# Patient Record
Sex: Female | Born: 1940
Health system: Southern US, Community
[De-identification: ages and names within clinical notes are randomized; demographics above are authoritative.]

## PROBLEM LIST (undated history)

## (undated) DIAGNOSIS — N939 Abnormal uterine and vaginal bleeding, unspecified: Secondary | ICD-10-CM

## (undated) DIAGNOSIS — N8500 Endometrial hyperplasia, unspecified: Secondary | ICD-10-CM

## (undated) DIAGNOSIS — F419 Anxiety disorder, unspecified: Secondary | ICD-10-CM

## (undated) DIAGNOSIS — N766 Ulceration of vulva: Secondary | ICD-10-CM

## (undated) DIAGNOSIS — N950 Postmenopausal bleeding: Secondary | ICD-10-CM

## (undated) DIAGNOSIS — C801 Malignant (primary) neoplasm, unspecified: Secondary | ICD-10-CM

## (undated) DIAGNOSIS — N854 Malposition of uterus: Secondary | ICD-10-CM

## (undated) DIAGNOSIS — I480 Paroxysmal atrial fibrillation: Secondary | ICD-10-CM

## (undated) HISTORY — DX: Ulceration of vulva: N76.6

## (undated) HISTORY — DX: Postmenopausal bleeding: N95.0

## (undated) HISTORY — DX: Endometrial hyperplasia, unspecified: N85.00

## (undated) HISTORY — DX: Malposition of uterus: N85.4

---

## 1999-07-02 ENCOUNTER — Other Ambulatory Visit: Admission: RE | Admit: 1999-07-02 | Discharge: 1999-07-02 | Payer: Self-pay | Admitting: *Deleted

## 1999-07-17 ENCOUNTER — Ambulatory Visit (HOSPITAL_COMMUNITY): Admission: RE | Admit: 1999-07-17 | Discharge: 1999-07-17 | Payer: Self-pay | Admitting: *Deleted

## 2000-04-30 ENCOUNTER — Emergency Department (HOSPITAL_COMMUNITY): Admission: EM | Admit: 2000-04-30 | Discharge: 2000-05-01 | Payer: Self-pay | Admitting: *Deleted

## 2000-07-07 ENCOUNTER — Other Ambulatory Visit: Admission: RE | Admit: 2000-07-07 | Discharge: 2000-07-07 | Payer: Self-pay | Admitting: *Deleted

## 2007-08-05 ENCOUNTER — Ambulatory Visit: Payer: Self-pay | Admitting: Gastroenterology

## 2007-08-08 ENCOUNTER — Encounter: Admission: RE | Admit: 2007-08-08 | Discharge: 2007-08-08 | Payer: Self-pay | Admitting: Internal Medicine

## 2007-09-06 ENCOUNTER — Ambulatory Visit: Payer: Self-pay | Admitting: Gastroenterology

## 2007-09-06 ENCOUNTER — Encounter: Payer: Self-pay | Admitting: Gastroenterology

## 2011-04-14 NOTE — Assessment & Plan Note (Signed)
[facility]                         GASTROENTEROLOGY OFFICE NOTE   NAME:Jill Cooke, Jill Cooke                          MRN:          [id number]  DATE:08/05/2007                            DOB:          Nov 09, 1941    REFERRING PHYSICIAN:  Allena Napoleon   REASON FOR CONSULTATION:  Hematochezia.   HISTORY OF PRESENT ILLNESS:  Jill Cooke is a very nice, 70 year old,  white female referred through the courtesy of Dr. Mia Creek. The  patient relates occasional episodes of small amounts of bright red blood  intermittently over the past few years. Over the past 2 weeks, she has  had 2 episodes of bright red and dark red rectal bleeding, the volume is  small, and it has not been associated with rectal pain or abdominal pain  and the symptoms resolved spontaneously. She notes no change in bowel  habits, change in stool caliber, constipation, diarrhea, abdominal pain,  rectal pain or weight loss. There is no family history of colon cancer,  colon polyps, or inflammatory bowel disease.   PAST MEDICAL HISTORY:  Measles in 1958.   PAST SURGICAL HISTORY:  Status post tonsillectomy at age 69.   CURRENT MEDICATIONS:  Aspirin 325 mg daily p.r.n. headache.   MEDICATION ALLERGIES:  None known.   SOCIAL HISTORY:  She is married with 2 children. She is a Statistician. She denies any tobacco product use. She drinks a modest amount  of alcohol.   REVIEW OF SYSTEMS:  Remarkable for sleeping problems over the past year  or so. Her last menstrual period was in 1990.   PHYSICAL EXAMINATION:  GENERAL:  Overweight, white female in no acute  distress.  VITAL SIGNS:  Height 5 feet 4 inches, weight 216 pounds, blood pressure  is 146/80, pulse 76 and regular.  HEENT:  Anicteric sclera, oropharynx clear.  CHEST:  Clear to auscultation bilaterally.  CARDIAC:  Regular rate and rhythm without murmurs appreciated.  ABDOMEN:  Soft, nontender, nondistended, normal active  bowel sounds, no  palpable organomegaly, masses, or hernias.  RECTAL:  Deferred to time of colonoscopy.  EXTREMITIES:  Without clubbing, cyanosis or edema.  NEUROLOGIC:  Alert and oriented x3. Grossly nonfocal.   ASSESSMENT/PLAN:  Small volume, painless hematochezia. Rule out  hemorrhoids, colorectal neoplasms and other disorders. The risks,  benefits, and alternatives to colonoscopy with polyps  with biopsy, possible polypectomy and possible destruction of internal  hemorrhoids discussed with the patient and she consents to proceed. This  will be scheduled electively.     Venita Lick. Russella Dar, MD, [facility]  Electronically Signed    MTS/MedQ  DD: 08/05/2007  DT: 08/05/2007  Job #: [id number]   cc:   Allena Napoleon

## 2012-07-05 ENCOUNTER — Encounter: Payer: Self-pay | Admitting: Gynecologic Oncology

## 2012-07-07 ENCOUNTER — Encounter: Payer: Self-pay | Admitting: Gynecologic Oncology

## 2012-07-07 ENCOUNTER — Ambulatory Visit: Payer: Medicare Other | Attending: Gynecologic Oncology | Admitting: Gynecologic Oncology

## 2012-07-07 VITALS — BP 154/90 | HR 72 | Temp 98.5°F | Resp 16 | Ht 62.91 in | Wt 217.5 lb

## 2012-07-07 DIAGNOSIS — N950 Postmenopausal bleeding: Secondary | ICD-10-CM | POA: Insufficient documentation

## 2012-07-07 DIAGNOSIS — N8500 Endometrial hyperplasia, unspecified: Secondary | ICD-10-CM

## 2012-07-07 DIAGNOSIS — N882 Stricture and stenosis of cervix uteri: Secondary | ICD-10-CM | POA: Insufficient documentation

## 2012-07-07 NOTE — Patient Instructions (Addendum)
Recommendation was made for a re attempt at endometrial sampling by uterine curettage 07/19/2012 by Dr. De Blanch.  Administer cytotec in the vaginal the evening prior.    Thank you very much [name] for allowing me to provide care for you today.  I appreciate your confidence in choosing our Gynecologic Oncology team.  If you have any questions about your visit today please call our office and we will get back to you as soon as possible.  Maryclare Labrador. Justyne Roell MD., PhD Gynecologic Oncology

## 2012-07-07 NOTE — Progress Notes (Signed)
Consult Note: Gyn-Onc  Consult was requested by Dr. Elmore Guise for the evaluation of Jill Cooke 71 y.o. female  CC: Postmenopausal bleeding.  HPI:  71 y/o G2P2 LNMP at 71 years old.    Patient noted clear vaginal discharge in JN that progressed to bleeding the following month.  Seen by Dr. Isabella Stalling in 01/2012 and atrophic vaginitis suspected.  Vaginal bleeding continued and she was referred to Dr. Elmore Guise,  Treated with (?) Megace and antibiotics with out improvement.  Pelvic UTZ 04/2012 c/w 2cm endometrial stripe and retroverted uterus. EMB unsuccesful.  She ws taken to the OR by Dr. Elmore Guise on  06/24/2012.  D&C hysteroscopy was not feasible because of severe cervical stenosis noted.  Patient reports continued spotting.    Current Meds:  Outpatient Encounter Prescriptions as of 07/07/2012  Medication Sig Dispense Refill  . Calcium Carbonate-Vitamin D (CALCIUM + D PO) Take by mouth.      . fish oil-omega-3 fatty acids 1000 MG capsule Take by mouth daily.      Marland Kitchen VITAMIN D, CHOLECALCIFEROL, PO Take by mouth.      . clobetasol cream (TEMOVATE) 0.05 % Apply topically. Apply to affected area on vulva daily until symptoms resolve and then 3 times per week.  If no symptoms, may stop using        Allergy: No Known Allergies  Social Hx:   History   Social History  . Marital Status: Married    Spouse Name: N/A    Number of Children: N/A  . Years of Education: N/A   Occupational History  . Not on file.   Social History Main Topics  . Smoking status: Never Smoker   . Smokeless tobacco: Not on file  . Alcohol Use: Yes     ocassional wine or beer  . Drug Use: No  . Sexually Active: No   Other Topics Concern  . Not on file   Social History Narrative  . No narrative on file    Past Surgical Hx: No past surgical history on file.  Past Medical Hx:  Past Medical History  Diagnosis Date  . Endometrial hyperplasia   . Postmenopausal bleeding   . Retroversion of uterus   . Vulvar ulceration      Past Gynecological History: G2P2 Menarche 9 years. Menopause 47 years.  No h/o abnormal pap. No LMP recorded. Patient is postmenopausal.  Family Hx: No family history on file.  Review of Systems:  Constitutional  Feels well,Cardiovascular  No chest pain, shortness of breath, Pulmonary  No cough or wheeze.  Gastro Intestinal  No nausea, vomitting, or diarrhoea. No bright red blood per rectum, no abdominal pain, change in bowel movement, or constipation.  Genito Urinary  No frequency, urgency, dysuria, vaginal spotting and intermittent clear vaginal discharge. Musculo Skeletal  No myalgia, arthralgia, joint swelling or pain  Neurologic  No weakness, numbness, change in gait,  Psychology  No depression, anxiety, insomnia.   Vitals:  Blood pressure 154/90, pulse 72, temperature 98.5 F (36.9 C), temperature source Oral, resp. rate 16, height 5' 2.91" (1.598 m), weight 217 lb 8 oz (98.657 kg).  Physical Exam: WD in NAD Neck  Supple NROM, without any enlargements.  Lymph Node Survey No cervical supraclavicular or inguinal adenopathy Cardiovascular  Pulse normal rate, regularity and rhythm. S1 and S2 normal.  Lungs  Clear to auscultation bilateraly, without wheezes/crackles/rhonchi. Good air movement.  Skin  No rash/lesions/breakdown  Psychiatry  Alert and oriented to person, place, and time  Abdomen  Normoactive bowel sounds, abdomen soft, non-tender and obese. Surgical  sites intact without evidence of hernia.  Back No CVA tenderness Genito Urinary  Vulva/vagina: Normal external female genitalia.  No lesions. No discharge or bleeding.  Bladder/urethra:  No lesions or masses  Vagina:atrophic, no blood or discharge is visible.  Cervix: Normal appearing, no lesions.Unable to enter the internal os.  Uterus: Small, mobile, no parametrial involvement or nodularity.  Adnexa: No palpable masses. Rectal  Good tone, no masses no cul de sac nodularity.  Extremities  No  bilateral cyanosis, clubbing or edema.   Assessment/Plan:  Jill Cooke  is a 71 y.o.  year old with postmenopausal bleeding and cervical stenosis.  On UTZ the uterus is retroverted with a 2.0cm endometrial stripe.  Benign endocervical material obtained at previous attempts to assess the uterus.   An unsuccessful  attempt was made to sample the uterine cavity at this visit.  Differential diagnosis is neoplasm versus an endometrial polyp.  Recommendation was made for a re attempt at endometrial sampling.  Plan for uterine curettage 07/19/2012 by Dr. De Blanch.  Patient advised to administer cytotec in the vaginal the evening prior.  Risks and benefits of the procedure were discussed with the patient.   Laurette Schimke, MD, PhD 07/07/2012, 12:25 PM

## 2012-07-11 ENCOUNTER — Encounter (HOSPITAL_COMMUNITY): Payer: Self-pay | Admitting: Pharmacy Technician

## 2012-07-15 ENCOUNTER — Encounter (HOSPITAL_COMMUNITY)
Admission: RE | Admit: 2012-07-15 | Discharge: 2012-07-15 | Disposition: A | Discharge status: Home / Self Care | Payer: Medicare Other | Source: Ambulatory Visit | Attending: Gynecology | Admitting: Gynecology

## 2012-07-15 ENCOUNTER — Encounter (HOSPITAL_COMMUNITY): Payer: Self-pay

## 2012-07-15 HISTORY — DX: Anxiety disorder, unspecified: F41.9

## 2012-07-15 LAB — CBC WITH DIFFERENTIAL/PLATELET
Eosinophils Relative: 1 % (ref 0–5)
Lymphocytes Relative: 23 % (ref 12–46)
Lymphs Abs: 2.4 10*3/uL (ref 0.7–4.0)
MCV: 90.4 fL (ref 78.0–100.0)
Monocytes Absolute: 0.7 10*3/uL (ref 0.1–1.0)
Monocytes Relative: 7 % (ref 3–12)
Platelets: 352 10*3/uL (ref 150–400)

## 2012-07-15 LAB — BASIC METABOLIC PANEL
BUN: 15 mg/dL (ref 6–23)
Calcium: 8.9 mg/dL (ref 8.4–10.5)
Creatinine, Ser: 0.84 mg/dL (ref 0.50–1.10)
GFR calc Af Amer: 79 mL/min — ABNORMAL LOW (ref >90)
GFR calc non Af Amer: 68 mL/min — ABNORMAL LOW (ref >90)
Glucose, Bld: 93 mg/dL (ref 70–99)

## 2012-07-15 LAB — SURGICAL PCR SCREEN: MRSA, PCR: NEGATIVE

## 2012-07-15 NOTE — Pre-Procedure Instructions (Signed)
EKG 6/13 chart

## 2012-07-15 NOTE — Patient Instructions (Signed)
20 Jill Cooke  07/15/2012   Your procedure is scheduled on:  07/19/12 Tuesday  Surgery 1305-1405  Report to [facility] at  1035     AM.  Call this number if you have problems the morning of surgery: [phone]     Or PST   [id number]  [name]   Remember:   Do not eat food or drink any fluids :After Midnight.  Monday NIGHT   Take these medicines the morning of surgery with A SIP OF WATER:   none   Do not wear jewelry, make-up or nail polish.  Do not wear lotions, powders, or perfumes. You may wear deodorant.  Do not shave 48 hours prior to surgery.  Do not bring valuables to the hospital.  Contacts, dentures or bridgework may not be worn into surgery.  Leave suitcase in the car. After surgery it may be brought to your room.  For patients admitted to the hospital, checkout time is 11:00 AM the day of discharge.   Patients discharged the day of surgery will not be allowed to drive home.  Name and phone number of your driver:     husband                                                                 Special Instructions: CHG Shower Use Special Wash: 1/2 bottle night before surgery and 1/2 bottle morning of surgery. REGULAR SOAP FACE AND PRIVATES              LADIES- NO SHAVING 48 HOURS BEFORE USING BETASEPT SOAP.                 Please read over the following fact sheets that you were given: MRSA Information

## 2012-07-19 ENCOUNTER — Other Ambulatory Visit: Payer: Self-pay

## 2012-07-19 ENCOUNTER — Ambulatory Visit (HOSPITAL_COMMUNITY)
Admission: RE | Admit: 2012-07-19 | Discharge: 2012-07-19 | Disposition: A | Discharge status: Other Acute Inpatient Hospital | Payer: Medicare Other | Source: Ambulatory Visit | Attending: Gynecology | Admitting: Gynecology

## 2012-07-19 ENCOUNTER — Inpatient Hospital Stay (HOSPITAL_COMMUNITY)
Admission: EM | Admit: 2012-07-19 | Discharge: 2012-07-20 | DRG: 310 | Disposition: A | Discharge status: Home / Self Care | Payer: Medicare Other | Attending: Cardiovascular Disease | Admitting: Cardiovascular Disease

## 2012-07-19 ENCOUNTER — Encounter (HOSPITAL_COMMUNITY): Payer: Self-pay | Admitting: *Deleted

## 2012-07-19 ENCOUNTER — Encounter (HOSPITAL_COMMUNITY): Payer: Self-pay | Admitting: Anesthesiology

## 2012-07-19 ENCOUNTER — Encounter (HOSPITAL_COMMUNITY)
Admission: RE | Disposition: A | Discharge status: Other Acute Inpatient Hospital | Payer: Self-pay | Source: Ambulatory Visit | Attending: Gynecology

## 2012-07-19 DIAGNOSIS — I4891 Unspecified atrial fibrillation: Secondary | ICD-10-CM | POA: Insufficient documentation

## 2012-07-19 DIAGNOSIS — I480 Paroxysmal atrial fibrillation: Secondary | ICD-10-CM

## 2012-07-19 DIAGNOSIS — N949 Unspecified condition associated with female genital organs and menstrual cycle: Secondary | ICD-10-CM | POA: Insufficient documentation

## 2012-07-19 DIAGNOSIS — Z01812 Encounter for preprocedural laboratory examination: Secondary | ICD-10-CM

## 2012-07-19 DIAGNOSIS — N938 Other specified abnormal uterine and vaginal bleeding: Secondary | ICD-10-CM | POA: Insufficient documentation

## 2012-07-19 DIAGNOSIS — N950 Postmenopausal bleeding: Secondary | ICD-10-CM

## 2012-07-19 HISTORY — DX: Abnormal uterine and vaginal bleeding, unspecified: N93.9

## 2012-07-19 HISTORY — DX: Paroxysmal atrial fibrillation: I48.0

## 2012-07-19 LAB — COMPREHENSIVE METABOLIC PANEL
ALT: 11 U/L (ref 0–35)
Alkaline Phosphatase: 104 U/L (ref 39–117)
BUN: 15 mg/dL (ref 6–23)
BUN: 13 mg/dL (ref 6–23)
CO2: 29 mEq/L (ref 19–32)
Calcium: 8.9 mg/dL (ref 8.4–10.5)
Chloride: 103 mEq/L (ref 96–112)
Creatinine, Ser: 0.78 mg/dL (ref 0.50–1.10)
GFR calc Af Amer: >90 mL/min (ref >90)
GFR calc Af Amer: >90 mL/min (ref >90)
GFR calc non Af Amer: 81 mL/min — ABNORMAL LOW (ref >90)
GFR calc non Af Amer: 82 mL/min — ABNORMAL LOW (ref >90)
Glucose, Bld: 108 mg/dL — ABNORMAL HIGH (ref 70–99)
Glucose, Bld: 106 mg/dL — ABNORMAL HIGH (ref 70–99)
Potassium: 4.1 mEq/L (ref 3.5–5.1)
Total Bilirubin: 0.3 mg/dL (ref 0.3–1.2)
Total Protein: 7.6 g/dL (ref 6.0–8.3)
Total Protein: 7.3 g/dL (ref 6.0–8.3)

## 2012-07-19 LAB — CBC WITH DIFFERENTIAL/PLATELET
Eosinophils Absolute: 0.1 10*3/uL (ref 0.0–0.7)
Eosinophils Absolute: 0.1 10*3/uL (ref 0.0–0.7)
Eosinophils Relative: 1 % (ref 0–5)
HCT: 42.3 % (ref 36.0–46.0)
Hemoglobin: 14.8 g/dL (ref 12.0–15.0)
Hemoglobin: 14.5 g/dL (ref 12.0–15.0)
Lymphocytes Relative: 21 % (ref 12–46)
Lymphs Abs: 2.3 10*3/uL (ref 0.7–4.0)
Lymphs Abs: 2.4 10*3/uL (ref 0.7–4.0)
MCH: 31 pg (ref 26.0–34.0)
MCH: 30.7 pg (ref 26.0–34.0)
MCV: 89.4 fL (ref 78.0–100.0)
Monocytes Absolute: 0.6 10*3/uL (ref 0.1–1.0)
Monocytes Relative: 6 % (ref 3–12)
Monocytes Relative: 6 % (ref 3–12)
Neutrophils Relative %: 71 % (ref 43–77)
Platelets: 331 10*3/uL (ref 150–400)
RBC: 4.78 MIL/uL (ref 3.87–5.11)
RBC: 4.73 MIL/uL (ref 3.87–5.11)
WBC: 11.3 10*3/uL — ABNORMAL HIGH (ref 4.0–10.5)

## 2012-07-19 LAB — PROTIME-INR
INR: 1.07 (ref 0.00–1.49)
Prothrombin Time: 14.1 seconds (ref 11.6–15.2)

## 2012-07-19 LAB — MAGNESIUM: Magnesium: 2.2 mg/dL (ref 1.5–2.5)

## 2012-07-19 LAB — TROPONIN I: Troponin I: <0.3 ng/mL (ref <0.30)

## 2012-07-19 SURGERY — DILATATION AND CURETTAGE /HYSTEROSCOPY
Anesthesia: General

## 2012-07-19 MED ORDER — ASPIRIN 81 MG PO CHEW
81.0000 mg | CHEWABLE_TABLET | ORAL | Status: AC
  Administered 2012-07-19: 81 mg via ORAL
  Filled 2012-07-19: qty 1

## 2012-07-19 MED ORDER — DILTIAZEM HCL 100 MG IV SOLR
5.0000 mg/h | Freq: Once | INTRAVENOUS | Status: AC
  Administered 2012-07-19: 5 mg/h via INTRAVENOUS
  Filled 2012-07-19: qty 100

## 2012-07-19 MED ORDER — ONDANSETRON HCL 4 MG/2ML IJ SOLN: 4.0000 mg | Freq: Four times a day (QID) | INTRAMUSCULAR | Status: DC | PRN

## 2012-07-19 MED ORDER — DILTIAZEM HCL 25 MG/5ML IV SOLN
25.0000 mg | Freq: Once | INTRAVENOUS | Status: AC
  Administered 2012-07-19: 25 mg via INTRAVENOUS
  Filled 2012-07-19: qty 5

## 2012-07-19 MED ORDER — LACTATED RINGERS IV SOLN: INTRAVENOUS | Status: DC

## 2012-07-19 MED ORDER — HEPARIN SOD (PORCINE) IN D5W 100 UNIT/ML IV SOLN
1000.0000 [IU]/h | INTRAVENOUS | Status: DC
  Administered 2012-07-19 (×2): 1000 [IU]/h via INTRAVENOUS
  Filled 2012-07-19: qty 250

## 2012-07-19 MED ORDER — ACETAMINOPHEN 325 MG PO TABS: 650.0000 mg | ORAL_TABLET | ORAL | Status: DC | PRN

## 2012-07-19 MED ORDER — ASPIRIN 300 MG RE SUPP: 300.0000 mg | RECTAL | Status: AC

## 2012-07-19 MED ORDER — METOPROLOL TARTRATE 1 MG/ML IV SOLN
5.0000 mg | INTRAVENOUS | Status: DC | PRN
  Administered 2012-07-19: 5 mg via INTRAVENOUS
  Filled 2012-07-19: qty 5

## 2012-07-19 MED ORDER — METOPROLOL TARTRATE 25 MG PO TABS
25.0000 mg | ORAL_TABLET | Freq: Two times a day (BID) | ORAL | Status: DC
  Administered 2012-07-19: 25 mg via ORAL
  Filled 2012-07-19: qty 1

## 2012-07-19 MED ORDER — ASPIRIN EC 81 MG PO TBEC
81.0000 mg | DELAYED_RELEASE_TABLET | Freq: Every day | ORAL | Status: DC
  Administered 2012-07-20: 81 mg via ORAL
  Filled 2012-07-19: qty 1

## 2012-07-19 MED ORDER — DILTIAZEM HCL 100 MG IV SOLR
5.0000 mg/h | INTRAVENOUS | Status: DC
  Filled 2012-07-19: qty 100

## 2012-07-19 MED ORDER — METOPROLOL TARTRATE 25 MG PO TABS
25.0000 mg | ORAL_TABLET | Freq: Two times a day (BID) | ORAL | Status: DC
  Administered 2012-07-20: 25 mg via ORAL
  Filled 2012-07-19 (×3): qty 1

## 2012-07-19 NOTE — ED Notes (Signed)
Pt in for surgery, surgical staff noticed irregular heart rate and rhythm. Pt has no hx of irregular heart rate. Pt denies any chest pain or SOB. Pts rate/rhythm on arrival to ER 124-153 rapid, irregular rate noted on monitor.

## 2012-07-19 NOTE — Progress Notes (Signed)
[facility], Theodore Demark, notified of pauses and notified cardiazem turned off due to heart rate.  Received verbal order with readback to modify lopressor orders.

## 2012-07-19 NOTE — Progress Notes (Signed)
Pt is having no complaints of dizziness, no SOB, no complaints of palpitations.  BP 138/62.

## 2012-07-19 NOTE — ED Provider Notes (Signed)
I saw and evaluated the patient, reviewed the resident's note and I agree with the findings and plan.  Cheri Guppy, MD 07/19/12 [phone]

## 2012-07-19 NOTE — Progress Notes (Signed)
Dr. Antoine Poche of [facility] notified of change in patient's rhythm from Atrial fibrillation to first degree heart block.

## 2012-07-19 NOTE — ED Notes (Signed)
Cardiologist gave verbal order to give 5mg  Metoprolol IV to lower heart rate.

## 2012-07-19 NOTE — ED Notes (Signed)
QIO:NG29<BM> Expected date:07/19/12<BR> Expected time:11:58 AM<BR> Means of arrival:<BR> Comments:<BR> From short stay-[place]

## 2012-07-19 NOTE — ED Notes (Signed)
Cardiologist at bedside.  

## 2012-07-19 NOTE — ED Notes (Signed)
Pt arrived to ER from short stay. Pt was scheduled to have D and C performed today. While in pre-op pt noted to have irregular heart rate. Pt brought to ER, placed on cardiac monitor, EKG obtained, IV started, bloodwork obtained. ER physician notified of pts arrival. Pt A&Ox's 3. Family at bedside. Pt in NAD.

## 2012-07-19 NOTE — ED Provider Notes (Signed)
I saw and evaluated the patient, reviewed the resident's note and I agree with the findings and plan. Was at ob to get d&c. Noted to have rapid hr.  Sent here.  Denies pain. Denies light headedness or sob.   She has not had caffeine since yest.  No recent illness. No decongestants. No hx of bleeding. No prior hx of afib or heart dz.  On exam no distress.  Rapid afib.   Controlled with dilt.    Discussed with cards. They will admit.  CRITICAL CARE Performed by: Nicholes Stairs   Total critical care time: 30 min  Critical care time was exclusive of separately billable procedures and treating other patients.  Critical care was necessary to treat or prevent imminent or life-threatening deterioration.  Critical care was time spent personally by me on the following activities: development of treatment plan with patient and/or surrogate as well as nursing, discussions with consultants, evaluation of patient's response to treatment, examination of patient, obtaining history from patient or surrogate, ordering and performing treatments and interventions, ordering and review of laboratory studies, ordering and review of radiographic studies, pulse oximetry and re-evaluation of patient's condition.  Cheri Guppy, MD 07/19/12 1432

## 2012-07-19 NOTE — Progress Notes (Signed)
Dr. Renold Don and Dagmar Hait RN at pt's bedside speaking to pt.

## 2012-07-19 NOTE — Progress Notes (Signed)
12 Lead EKG done d/t unable to manually assess pt's HR.  EKG abnormal and Dr. Renold Don notified.

## 2012-07-19 NOTE — Progress Notes (Signed)
ANTICOAGULATION CONSULT NOTE - Initial Consult  Pharmacy Consult for Heparin Indication: Afib, ACS  No Known Allergies  Patient Measurements: As of 07/15/2012: Ht 62in Wt 96.6kg IBW 50kg Adjusted body weight 68kg  Vital Signs: Temp: 97.8 F (36.6 C) (08/20 1243) Temp src: Oral (08/20 1243) BP: 129/76 mmHg (08/20 1600) Pulse Rate: 77  (08/20 1600)  Labs:  Basename 07/19/12 1245  HGB 14.8  HCT 43.0  PLT 347  APTT --  LABPROT --  INR --  HEPARINUNFRC --  CREATININE 0.79  CKTOTAL --  CKMB --  TROPONINI <0.30   CrCl = 72 ml/mim/1.62m2 (normalized)  Medical History: Past Medical History  Diagnosis Date  . Endometrial hyperplasia   . Postmenopausal bleeding   . Retroversion of uterus   . Vulvar ulceration   . Anxiety     with  diagnosis    Medications:  Scheduled:    . aspirin  81 mg Oral NOW   Or  . aspirin  300 mg Rectal NOW  . aspirin EC  81 mg Oral Daily  . diltiazem (CARDIZEM) infusion  5 mg/hr Intravenous Once  . diltiazem  25 mg Intravenous Once  . metoprolol tartrate  25 mg Oral BID    Assessment:  71 YOF with vaginal bleeding presents to [facility] short stay for D&C, found to have Afib with RVR  Beginning heparin drip per pharmacy - monitor vaginal bleeding closely  Baseline coags pending, no documented use of anticoagulants prior to admit   Goal of Therapy:  Heparin level 0.3-0.7 units/ml Monitor platelets by anticoagulation protocol: Yes   Plan:   No heparin bolus due to vaginal bleeding  Heparin infusion 1000 units/hr (~15 units/kg/hr of adjusted body weight)  Check heparin level in 8hrs  Daily heparin level, CBC   Loralee Pacas, PharmD, BCPS Pager: [phone] 07/19/2012,4:06 PM

## 2012-07-19 NOTE — H&P (Signed)
ADMISSION HISTORY AND PHYSICAL   Date: 07/19/2012               Patient Name:  Jill Cooke MRN: [id number]  DOB: [birth year] Age / Sex: 71 y.o., female        PCP: Tomma Lightning Primary Cardiologist: New to [facility]         History of Present Illness: Patient is a 71 y.o. female with a PMHx of vaginal bleeding, who was admitted to [facility] on 07/19/2012 for evaluation of rapid atrial fibrillation.   She was in short stay at [facility] to have a D&C.   She was found to have A-Fib with RVR.  She had no symptoms - no chest pain, no dyspnea, no dizziness-. She has never had any cardiac problems.   She has had some vaginal bleeding - no other medical problems    Medications: Outpatient medications: None   No Known Allergies   Past Medical History  Diagnosis Date  . Endometrial hyperplasia   . Postmenopausal bleeding   . Retroversion of uterus   . Vulvar ulceration   . Anxiety     with  diagnosis    History reviewed. No pertinent past surgical history.  History reviewed. No pertinent family history.  Social History:  reports that she has never smoked. She has never used smokeless tobacco. She reports that she drinks alcohol. She reports that she does not use illicit drugs.   Review of Systems: Constitutional:  denies fever, chills, diaphoresis, appetite change and fatigue.  HEENT: denies photophobia, eye pain, redness, hearing loss, ear pain, congestion, sore throat, rhinorrhea, sneezing, neck pain, neck stiffness and tinnitus.  Respiratory: denies SOB, DOE, cough, chest tightness, and wheezing.  Cardiovascular: denies chest pain, palpitations and leg swelling.  Gastrointestinal: denies nausea, vomiting, abdominal pain, diarrhea, constipation, blood in stool.  Genitourinary: denies dysuria, urgency, frequency, hematuria, flank pain and difficulty urinating.  She has had some vaginal bleeding.  Musculoskeletal: denies  myalgias, back pain, joint swelling, arthralgias and gait  problem.   Skin: denies pallor, rash and wound.  Neurological: denies dizziness, seizures, syncope, weakness, light-headedness, numbness and headaches.   Hematological: denies adenopathy, easy bruising, personal or family bleeding history.  Psychiatric/ Behavioral: denies suicidal ideation, mood changes, confusion, nervousness, sleep disturbance and agitation.    Physical Exam: BP 157/84  Pulse 106  Temp 97.8 F (36.6 C) (Oral)  Resp 18  SpO2 96%  General: Vital signs reviewed and noted. Well-developed, well-nourished, in no acute distress; alert, appropriate and cooperative throughout examination.  Head: Normocephalic, atraumatic, sclera anicteric, mucus membranes are moist  Neck: Supple. Negative for carotid bruits. JVD not elevated.  Lungs:  Clear bilaterally to auscultation without wheezes, rales, or rhonchi. Breathing is unlabored.  Heart: Irreg. Irreg.  with S1 S2. No murmurs, rubs, or gallops , tachycardic.  Abdomen:  Soft, non-tender, non-distended with normoactive bowel sounds. No hepatomegaly. No rebound/guarding. No obvious abdominal masses  MSK: Strength and the appear normal for age.  Extremities: No clubbing or cyanosis. No edema.  Distal pedal pulses are 2+ and equal bilaterally.  Neurologic: Alert and oriented X 3. Moves all extremities spontaneously  Psych:  Responds to questions appropriately with a normal affect.    Lab results: Basic Metabolic Panel:  Lab 07/19/12 4098 07/15/12 1345  NA 140 137  K 4.1 3.7  CL 103 100  CO2 29 30  GLUCOSE 108* 93  BUN 15 15  CREATININE 0.79 0.84  CALCIUM 9.2 8.9  MG -- --  PHOS -- --    Liver Function Tests:  Lab 07/19/12 1245  AST 14  ALT 11  ALKPHOS 104  BILITOT 0.3  PROT 7.6  ALBUMIN 3.8   No results found for this basename: LIPASE:3,AMYLASE:3 in the last 168 hours  CBC:  Lab 07/19/12 1245 07/15/12 1345  WBC 10.1 10.4  NEUTROABS 7.1 7.2  HGB 14.8 14.3  HCT 43.0 42.3  MCV 90.0 90.4  PLT 347 352     Cardiac Enzymes:  Lab 07/19/12 1245  CKTOTAL --  CKMB --  CKMBINDEX --  TROPONINI <0.30    BNP: No components found with this basename: POCBNP:3  CBG: No results found for this basename: GLUCAP:5 in the last 168 hours  Coagulation Studies: No results found for this basename: LABPROT:3,INR:3 in the last 72 hours   Other results: EKG : Atrial fib with RVR. No ST or T wave changes   Assessment & Plan:  1. A-fib with RVR: Ms. Stones presents to the ER with AF with RVR.  She is asymptomatic and did not even know that her HR was irregular.  She denies any symptoms of CP or dyspnea.  Will increase Diltiazem drip to 10,  Add low dose metoprolol. Check echo tomorrow.  She has no symptoms of ischemia -  Will start her on Heparin - will need to watch closely for any increase in vaginal bleeding.  My hope is that she will convert to NSR on the dilt drip.    2. Vaginal Bleeding:  Minimal bleeding .  Will start heparin for new and hopefully she will convert.  I would not think she needs coumadin if she converts quickly.  I would not do a TEE cardioversion as that would necessitate coumadin for 3-4 weeks following cardioversion.   She would like  to have her D&C in several weeks   Vesta Mixer, Montez Hageman., MD, [facility] 07/19/2012, 2:52 PM

## 2012-07-19 NOTE — ED Provider Notes (Signed)
History     CSN: [id number]  Arrival date & time 07/19/12  1240   First MD Initiated Contact with Patient 07/19/12 1248      Chief Complaint  Patient presents with  . Atrial Fibrillation    HPI 71 yo female who presents with new onset atrial fibrillation noticed this morning before a scheduled D&C. She reports normal EKG's in June and July. Has never been told of irregular heart rate. Denies any shortness of breath, chest pain. Drinks caffeine chronically but no increase recently. Doesn't smoke. No nausea, no vomiting.  No diarrhea, no blood in stool, no hematuria. Has some occasional vaginal bleeding but not currently.  Past Medical History  Diagnosis Date  . Endometrial hyperplasia   . Postmenopausal bleeding   . Retroversion of uterus   . Vulvar ulceration   . Anxiety     with  diagnosis    History reviewed. No pertinent past surgical history.  History reviewed. No pertinent family history.  History  Substance Use Topics  . Smoking status: Never Smoker   . Smokeless tobacco: Never Used  . Alcohol Use: Yes     ocassional wine or beer   2 x month    OB History    Grav Para Term Preterm Abortions TAB SAB Ect Mult Living                  Review of Systems  All other systems reviewed and are negative.    Allergies  Review of patient's allergies indicates no known allergies.  Home Medications   No current outpatient prescriptions on file.  BP 132/74  Pulse 86  Temp 97.8 F (36.6 C) (Oral)  Resp 19  SpO2 99%  Physical Exam  Constitutional: She is oriented to person, place, and time. No distress.  Cardiovascular:       Irregularly irregular rhythm in the 130's.  Pulmonary/Chest: Effort normal and breath sounds normal. No respiratory distress.  Abdominal: Soft. Bowel sounds are normal. She exhibits no distension. There is no tenderness.  Neurological: She is alert and oriented to person, place, and time.  Skin: Skin is warm and dry.    ED Course    Procedures    Date: 07/19/2012  Rate: 140's  Rhythm: atrial fibrillation  QRS Axis: normal  Intervals: normal  ST/T Wave abnormalities: normal  Conduction Disutrbances: none  Old EKG Reviewed: none available  Labs Reviewed  COMPREHENSIVE METABOLIC PANEL - Abnormal; Notable for the following:    Glucose, Bld 108 (*)     GFR calc non Af Amer 81 (*)     All other components within normal limits  CBC WITH DIFFERENTIAL  TROPONIN I  POCT I-STAT TROPONIN I   No results found.   1. Atrial fibrillation       MDM  Diltiazem 0.25mg /kg bolus x1. Rate controled to 90's. Started diltiazem drip at 5mg /hr.  CBC, BMP, toponin all normal.  [facility] for admission.   Marena Chancy, PGY-2  [facility]  07/19/12 1505  Lonia Skinner, MD 07/19/12 [phone]

## 2012-07-20 ENCOUNTER — Encounter (HOSPITAL_COMMUNITY): Payer: Self-pay | Admitting: Nurse Practitioner

## 2012-07-20 DIAGNOSIS — I517 Cardiomegaly: Secondary | ICD-10-CM

## 2012-07-20 DIAGNOSIS — I480 Paroxysmal atrial fibrillation: Secondary | ICD-10-CM

## 2012-07-20 LAB — CBC
HCT: 38.8 % (ref 36.0–46.0)
MCV: 89.2 fL (ref 78.0–100.0)
RDW: 13.6 % (ref 11.5–15.5)
WBC: 10.4 10*3/uL (ref 4.0–10.5)

## 2012-07-20 LAB — BASIC METABOLIC PANEL
BUN: 16 mg/dL (ref 6–23)
CO2: 30 mEq/L (ref 19–32)
Chloride: 102 mEq/L (ref 96–112)
Creatinine, Ser: 0.83 mg/dL (ref 0.50–1.10)
GFR calc Af Amer: 80 mL/min — ABNORMAL LOW (ref >90)

## 2012-07-20 LAB — HEPARIN LEVEL (UNFRACTIONATED): Heparin Unfractionated: 0.14 IU/mL — ABNORMAL LOW (ref 0.30–0.70)

## 2012-07-20 LAB — LIPID PANEL: HDL: 37 mg/dL — ABNORMAL LOW (ref >39)

## 2012-07-20 MED ORDER — METOPROLOL TARTRATE 25 MG PO TABS: 25.0000 mg | ORAL_TABLET | Freq: Two times a day (BID) | ORAL | Status: DC

## 2012-07-20 MED ORDER — HEPARIN SOD (PORCINE) IN D5W 100 UNIT/ML IV SOLN
1400.0000 [IU]/h | INTRAVENOUS | Status: DC
  Administered 2012-07-20: 1400 [IU]/h via INTRAVENOUS
  Filled 2012-07-20 (×3): qty 250

## 2012-07-20 NOTE — Progress Notes (Signed)
  Echocardiogram 2D Echocardiogram has been performed.  Wolfe Camarena 07/20/2012, 9:30 AM

## 2012-07-20 NOTE — Discharge Summary (Signed)
Patient ID: Jill Cooke,  MRN: [id number], DOB/AGE: [birth year] 71 y.o.  Admit date: 07/19/2012 Discharge date: 07/20/2012  Primary Care Provider: Tomma Lightning Primary Cardiologist: Katherina Right, MD  Discharge Diagnoses Principal Problem:  *PAF (paroxysmal atrial fibrillation) Active Problems:  Post-menopausal bleeding  Allergies No Known Allergies  Procedures  2D Echocardiogram 07/20/2012 Study Conclusions  - Left ventricle: The cavity size was normal. Wall thickness   was increased in a pattern of mild LVH. Systolic function   was normal. The estimated ejection fraction was in the   range of 60% to 65%. Wall motion was normal; there were no   regional wall motion abnormalities. Features are   consistent with a pseudonormal left ventricular filling   pattern, with concomitant abnormal relaxation and   increased filling pressure (grade 2 diastolic   dysfunction). - Aortic valve: There was no stenosis. - Mitral valve: Trivial regurgitation. - Left atrium: The atrium was mildly dilated. - Right ventricle: The cavity size was normal. Systolic   function was normal. - Tricuspid valve: Peak RV-RA gradient: 27mm Hg (S). - Pulmonary arteries: PA peak pressure: 32mm Hg (S). - Inferior vena cava: The vessel was normal in size; the   respirophasic diameter changes were in the normal range (=   50%); findings are consistent with normal central venous   pressure. _____________  History of Present Illness  71 y/o female without prior cardiac history.  She has been having vaginal bleeding and presented to [facility] on 8/20 for elective D & C.  While in Short Stay, awaiting her procedure, she was found to be in afib with RVR and rates in the low 100's.  She was asymptomatic.  Cardiology was called for evaluation and admission.  Hospital Course  Following admission, pt was placed on IV diltiazem and IV heparin, in hopes that she would convert overnight, realizing that at the  current time she is a poor anticoagulation candidate given ongoing vaginal bleeding.  Fortunately, pt converted to sinus rhythm this AM.  IV diltiazem was discontinued and low-dose beta blocker has been started.  TSH was normal and her home dose of thyroid replacement was continued.  A 2D echocardiogram was carried out this AM and showed normal LV function without significant valvular abnormalities.  We plan to d/c her home today in good condition.  She will not be anticoagulated (CHADS2=0), however after her vaginal bleeding is effectively managed, she should be initiated on low-dose ASA therapy.  She will be discharged home today in good condition.  Discharge Vitals Blood pressure 112/51, pulse 59, temperature 98.2 F (36.8 C), temperature source Oral, resp. rate 18, height 5\' 2"  (1.575 m), weight 207 lb 7.3 oz (94.1 kg), SpO2 93.00%.  Filed Weights   07/19/12 1720 07/20/12 0200  Weight: 207 lb 7.3 oz (94.1 kg) 207 lb 7.3 oz (94.1 kg)   Labs  CBC  Basename 07/20/12 0115 07/19/12 1615 07/19/12 1245  WBC 10.4 11.3* --  NEUTROABS -- 8.2* 7.1  HGB 13.5 14.5 --  HCT 38.8 42.3 --  MCV 89.2 89.4 --  PLT 302 331 --   Basic Metabolic Panel  Basename 07/20/12 0115 07/19/12 1615  NA 137 138  K 3.6 4.1  CL 102 103  CO2 30 29  GLUCOSE 109* 106*  BUN 16 13  CREATININE 0.83 0.78  CALCIUM 8.5 8.9  MG -- 2.2  PHOS -- --   Liver Function Tests  Basename 07/19/12 1615 07/19/12 1245  AST 15 14  ALT  11 11  ALKPHOS 104 104  BILITOT 0.3 0.3  PROT 7.3 7.6  ALBUMIN 3.4* 3.8   Cardiac Enzymes  Basename 07/19/12 1245  CKTOTAL --  CKMB --  CKMBINDEX --  TROPONINI <0.30   Fasting Lipid Panel  Basename 07/20/12 0115  CHOL 191  HDL 37*  LDLCALC 126*  TRIG 142  CHOLHDL 5.2  LDLDIRECT --   Thyroid Function Tests  Basename 07/19/12 1615  TSH 1.379  T4TOTAL --  T3FREE --  THYROIDAB --   Disposition  Pt is being discharged home today in good condition.  Follow-up Plans &  Appointments  Follow-up Information    Follow up with Norma Fredrickson, NP on 08/04/2012. (8:45 AM)    Contact information:   [address] [place] [address] [phone]       Follow up with Tomma Lightning, MD. (as scheduled)    Contact information:   [address] [facility] [place] [place] [address] [phone]        Discharge Medications  Medication List  As of 07/20/2012  2:37 PM   TAKE these medications         metoprolol tartrate 25 MG tablet   Commonly known as: LOPRESSOR   Take 1 tablet (25 mg total) by mouth 2 (two) times daily.      misoprostol 200 MCG tablet   Commonly known as: CYTOTEC   Take 200 mcg by mouth 4 (four) times daily.          Outstanding Labs/Studies  None  Duration of Discharge Encounter   Greater than 30 minutes including physician time.  Signed, Nicolasa Ducking NP 07/20/2012, 2:37 PM   Patient seen and examined.  Plan as discussed in my rounding note for today and outlined above. Fayrene Fearing [facility]  07/21/2012  9:34 AM

## 2012-07-20 NOTE — Discharge Instructions (Signed)
***  PLEASE REMEMBER TO BRING ALL OF YOUR MEDICATIONS TO EACH OF YOUR FOLLOW-UP OFFICE VISITS.  

## 2012-07-20 NOTE — Progress Notes (Signed)
ANTICOAGULATION CONSULT NOTE - F/U  Pharmacy Consult for Heparin Indication: Afib, ACS  No Known Allergies  Patient Measurements: As of 07/15/2012: Ht 62in Wt 96.6kg IBW 50kg Adjusted body weight 68kg  Vital Signs: Temp: 98.4 F (36.9 C) (08/21 0000) Temp src: Oral (08/21 0000) BP: 115/61 mmHg (08/21 0100) Pulse Rate: 58  (08/21 0100)  Labs:  Basename 07/20/12 0115 07/19/12 1615 07/19/12 1245  HGB 13.5 14.5 --  HCT 38.8 42.3 43.0  PLT 302 331 347  APTT -- 34 --  LABPROT -- 14.1 --  INR -- 1.07 --  HEPARINUNFRC 0.14* -- --  CREATININE 0.83 0.78 0.79  CKTOTAL -- -- --  CKMB -- -- --  TROPONINI -- -- <0.30   CrCl = 72 ml/mim/1.51m2 (normalized)  Medical History: Past Medical History  Diagnosis Date  . Endometrial hyperplasia   . Postmenopausal bleeding   . Retroversion of uterus   . Vulvar ulceration   . Anxiety     with  diagnosis    Medications:  Scheduled:     . aspirin  81 mg Oral NOW   Or  . aspirin  300 mg Rectal NOW  . aspirin EC  81 mg Oral Daily  . diltiazem (CARDIZEM) infusion  5 mg/hr Intravenous Once  . diltiazem  25 mg Intravenous Once  . metoprolol tartrate  25 mg Oral BID  . DISCONTD: metoprolol tartrate  25 mg Oral BID    Assessment:  71 YOF with vaginal bleeding presents to [facility] short stay for D&C, found to have Afib with RVR  Beginning heparin drip per pharmacy - monitor vaginal bleeding closely  Baseline coags pending, no documented use of anticoagulants prior to admit  Bleeding stable  and no IV interuptions per RN.  HL low.   Goal of Therapy:  Heparin level 0.3-0.7 units/ml Monitor platelets by anticoagulation protocol: Yes   Plan:   Increase Heparin to 1400 units/hr.  Reheck heparin level in 8hrs  Daily heparin level, CBC   Lorenza Evangelist 07/20/2012 2:23 AM

## 2012-07-20 NOTE — Progress Notes (Signed)
CARE MANAGEMENT NOTE 07/20/2012  Patient:  Jill Cooke, Jill Cooke   Account Number:  [id number]  Date Initiated:  07/20/2012  Documentation initiated by:  Nayana Lenig  Subjective/Objective Assessment:   pt in short stay began having a.fib, addmitted to [facility] and placed on cardizem drip with titration and monitoring     Action/Plan:   lives at home has support system in place.   Anticipated DC Date:  07/23/2012   Anticipated DC Plan:  HOME/SELF CARE  In-house referral  NA      DC Planning Services  NA      [facility] Choice  NA   Choice offered to / List presented to:  NA   DME arranged  NA      DME agency  NA     HH arranged  NA      HH agency  NA   Status of service:  In process, will continue to follow Medicare Important Message given?  NA - LOS <3 / Initial given by admissions (If response is "NO", the following Medicare IM given date fields will be blank) Date Medicare IM given:   Date Additional Medicare IM given:    Discharge Disposition:    Per UR Regulation:  Reviewed for med. necessity/level of care/duration of stay  If discussed at Long Length of Stay Meetings, dates discussed:    Comments:  [id number]/Jacey Eckerson Earlene Plater, RN, BSN, CCM: CHART REVIEWED AND UPDATED. NO DISCHARGE NEEDS PRESENT AT THIS TIME. CASE MANAGEMENT [phone]

## 2012-07-20 NOTE — Progress Notes (Signed)
SUBJECTIVE:  No chest pain.  No SOB   PHYSICAL EXAM Filed Vitals:   07/20/12 0200 07/20/12 0300 07/20/12 0400 07/20/12 0500  BP: 110/58 115/58 108/53 105/54  Pulse: 57 59 56 56  Temp:      TempSrc:      Resp: 17 19 15 15   Height:      Weight: 207 lb 7.3 oz (94.1 kg)     SpO2: 95% 95% 94% 96%   General:  No distress Lungs:  Clear Heart:  RRR Abdomen:  Positive bowel sounds, no rebound no guarding Extremities:  No edema  LABS: Lab Results  Component Value Date   TROPONINI <0.30 07/19/2012   Results for orders placed during the hospital encounter of 07/19/12 (from the past 24 hour(s))  CBC WITH DIFFERENTIAL     Status: Normal   Collection Time   07/19/12 12:45 PM      Component Value Range   WBC 10.1  4.0 - 10.5 K/uL   RBC 4.78  3.87 - 5.11 MIL/uL   Hemoglobin 14.8  12.0 - 15.0 g/dL   HCT 16.1  09.6 - 04.5 %   MCV 90.0  78.0 - 100.0 fL   MCH 31.0  26.0 - 34.0 pg   MCHC 34.4  30.0 - 36.0 g/dL   RDW 40.9  81.1 - 91.4 %   Platelets 347  150 - 400 K/uL   Neutrophils Relative 71  43 - 77 %   Neutro Abs 7.1  1.7 - 7.7 K/uL   Lymphocytes Relative 23  12 - 46 %   Lymphs Abs 2.3  0.7 - 4.0 K/uL   Monocytes Relative 6  3 - 12 %   Monocytes Absolute 0.6  0.1 - 1.0 K/uL   Eosinophils Relative 1  0 - 5 %   Eosinophils Absolute 0.1  0.0 - 0.7 K/uL   Basophils Relative 0  0 - 1 %   Basophils Absolute 0.0  0.0 - 0.1 K/uL  COMPREHENSIVE METABOLIC PANEL     Status: Abnormal   Collection Time   07/19/12 12:45 PM      Component Value Range   Sodium 140  135 - 145 mEq/L   Potassium 4.1  3.5 - 5.1 mEq/L   Chloride 103  96 - 112 mEq/L   CO2 29  19 - 32 mEq/L   Glucose, Bld 108 (*) 70 - 99 mg/dL   BUN 15  6 - 23 mg/dL   Creatinine, Ser 7.82  0.50 - 1.10 mg/dL   Calcium 9.2  8.4 - 95.6 mg/dL   Total Protein 7.6  6.0 - 8.3 g/dL   Albumin 3.8  3.5 - 5.2 g/dL   AST 14  0 - 37 U/L   ALT 11  0 - 35 U/L   Alkaline Phosphatase 104  39 - 117 U/L   Total Bilirubin 0.3  0.3 - 1.2 mg/dL     GFR calc non Af Amer 81 (*) >90 mL/min   GFR calc Af Amer >90  >90 mL/min  TROPONIN I     Status: Normal   Collection Time   07/19/12 12:45 PM      Component Value Range   Troponin I <0.30  <0.30 ng/mL  POCT I-STAT TROPONIN I     Status: Normal   Collection Time   07/19/12 12:58 PM      Component Value Range   Troponin i, poc 0.00  0.00 - 0.08 ng/mL   Comment 3  TSH     Status: Normal   Collection Time   07/19/12  4:15 PM      Component Value Range   TSH 1.379  0.350 - 4.500 uIU/mL  CBC WITH DIFFERENTIAL     Status: Abnormal   Collection Time   07/19/12  4:15 PM      Component Value Range   WBC 11.3 (*) 4.0 - 10.5 K/uL   RBC 4.73  3.87 - 5.11 MIL/uL   Hemoglobin 14.5  12.0 - 15.0 g/dL   HCT 40.9  81.1 - 91.4 %   MCV 89.4  78.0 - 100.0 fL   MCH 30.7  26.0 - 34.0 pg   MCHC 34.3  30.0 - 36.0 g/dL   RDW 78.2  95.6 - 21.3 %   Platelets 331  150 - 400 K/uL   Neutrophils Relative 73  43 - 77 %   Neutro Abs 8.2 (*) 1.7 - 7.7 K/uL   Lymphocytes Relative 21  12 - 46 %   Lymphs Abs 2.4  0.7 - 4.0 K/uL   Monocytes Relative 6  3 - 12 %   Monocytes Absolute 0.6  0.1 - 1.0 K/uL   Eosinophils Relative 1  0 - 5 %   Eosinophils Absolute 0.1  0.0 - 0.7 K/uL   Basophils Relative 0  0 - 1 %   Basophils Absolute 0.0  0.0 - 0.1 K/uL  COMPREHENSIVE METABOLIC PANEL     Status: Abnormal   Collection Time   07/19/12  4:15 PM      Component Value Range   Sodium 138  135 - 145 mEq/L   Potassium 4.1  3.5 - 5.1 mEq/L   Chloride 103  96 - 112 mEq/L   CO2 29  19 - 32 mEq/L   Glucose, Bld 106 (*) 70 - 99 mg/dL   BUN 13  6 - 23 mg/dL   Creatinine, Ser 0.86  0.50 - 1.10 mg/dL   Calcium 8.9  8.4 - 57.8 mg/dL   Total Protein 7.3  6.0 - 8.3 g/dL   Albumin 3.4 (*) 3.5 - 5.2 g/dL   AST 15  0 - 37 U/L   ALT 11  0 - 35 U/L   Alkaline Phosphatase 104  39 - 117 U/L   Total Bilirubin 0.3  0.3 - 1.2 mg/dL   GFR calc non Af Amer 82 (*) >90 mL/min   GFR calc Af Amer >90  >90 mL/min  MAGNESIUM      Status: Normal   Collection Time   07/19/12  4:15 PM      Component Value Range   Magnesium 2.2  1.5 - 2.5 mg/dL  APTT     Status: Normal   Collection Time   07/19/12  4:15 PM      Component Value Range   aPTT 34  24 - 37 seconds  PROTIME-INR     Status: Normal   Collection Time   07/19/12  4:15 PM      Component Value Range   Prothrombin Time 14.1  11.6 - 15.2 seconds   INR 1.07  0.00 - 1.49  HEPARIN LEVEL (UNFRACTIONATED)     Status: Abnormal   Collection Time   07/20/12  1:15 AM      Component Value Range   Heparin Unfractionated 0.14 (*) 0.30 - 0.70 IU/mL  CBC     Status: Normal   Collection Time   07/20/12  1:15 AM      Component  Value Range   WBC 10.4  4.0 - 10.5 K/uL   RBC 4.35  3.87 - 5.11 MIL/uL   Hemoglobin 13.5  12.0 - 15.0 g/dL   HCT 44.0  10.2 - 72.5 %   MCV 89.2  78.0 - 100.0 fL   MCH 31.0  26.0 - 34.0 pg   MCHC 34.8  30.0 - 36.0 g/dL   RDW 36.6  44.0 - 34.7 %   Platelets 302  150 - 400 K/uL  BASIC METABOLIC PANEL     Status: Abnormal   Collection Time   07/20/12  1:15 AM      Component Value Range   Sodium 137  135 - 145 mEq/L   Potassium 3.6  3.5 - 5.1 mEq/L   Chloride 102  96 - 112 mEq/L   CO2 30  19 - 32 mEq/L   Glucose, Bld 109 (*) 70 - 99 mg/dL   BUN 16  6 - 23 mg/dL   Creatinine, Ser 4.25  0.50 - 1.10 mg/dL   Calcium 8.5  8.4 - 95.6 mg/dL   GFR calc non Af Amer 69 (*) >90 mL/min   GFR calc Af Amer 80 (*) >90 mL/min    Intake/Output Summary (Last 24 hours) at 07/20/12 0529 Last data filed at 07/20/12 0500  Gross per 24 hour  Intake 308.43 ml  Output    900 ml  Net -591.57 ml    EKG:  NSR, rate 56, first degree AV block.  No acute ST T wave changes.  07/20/2012  ASSESSMENT AND PLAN:  Atrial fibrillation -  Went back into sinus rhythm.  Diltiazem off.  Echocardiogram pending.  She can be discharged today.  I would send her home with a low dose of beta blocker (metop 12.5 bid).  No anticoagulation as she is low risk for thromboembolism.  She can  follow in our office in 2 wks (Dr. Elease Hashimoto or an extender).  She can reschedule her D&C.  Note (Lipids and TSH are pending.)  Jill Cooke 07/20/2012 5:29 AM

## 2012-07-20 NOTE — Progress Notes (Signed)
ANTICOAGULATION CONSULT NOTE - Follow Up Consult  Pharmacy Consult for Heparin Indication: chest pain/ACS and atrial fibrillation  No Known Allergies  Patient Measurements: Height: 5\' 2"  (157.5 cm) Weight: 207 lb 7.3 oz (94.1 kg) IBW/kg (Calculated) : 50.1    Vital Signs: Temp: 98.2 F (36.8 C) (08/21 1200) Temp src: Oral (08/21 1200) BP: 124/66 mmHg (08/21 1200) Pulse Rate: 61  (08/21 1200)  Labs:  Basename 07/20/12 1200 07/20/12 0115 07/19/12 1615 07/19/12 1245  HGB -- 13.5 14.5 --  HCT -- 38.8 42.3 43.0  PLT -- 302 331 347  APTT -- -- 34 --  LABPROT -- -- 14.1 --  INR -- -- 1.07 --  HEPARINUNFRC 0.50 0.14* -- --  CREATININE -- 0.83 0.78 0.79  CKTOTAL -- -- -- --  CKMB -- -- -- --  TROPONINI -- -- -- <0.30    Estimated Creatinine Clearance: 65.5 ml/min (by C-G formula based on Cr of 0.83).   Medications:  Infusions:    . diltiazem (CARDIZEM) infusion Stopped (07/19/12 1923)  . heparin 1,400 Units/hr (07/20/12 0236)  . DISCONTD: heparin 1,000 Units/hr (07/19/12 1720)    Assessment:  71 YOF with vaginal bleeding presented to [facility] stay for [facility] 8/20, found to have Afib with RVR and sx of CP so admitted and started on heparin drip per pharmacy  Now in NSR and no further CP  Heparin infusion @ 1400 units/hour, heparin level is now within desired range  Pt and RN report NO bleeding. Rn reports no problems w/IV line or infusion. Is notifying for another bag of heparin as it is out now.  Per RN and pt, pt may be d/c later today pending 2D ECHO results  Goal of Therapy:  Heparin level 0.3-0.7 units/ml Monitor platelets by anticoagulation protocol: Yes   Plan:   No change to rate  Recheck level in 8 hr if pt still here  Notified RN and pt of plan  Gwen Her PharmD  [phone] 07/20/2012 1:03 PM

## 2012-08-04 ENCOUNTER — Ambulatory Visit (INDEPENDENT_AMBULATORY_CARE_PROVIDER_SITE_OTHER): Payer: Medicare Other | Admitting: Nurse Practitioner

## 2012-08-04 ENCOUNTER — Encounter: Payer: Self-pay | Admitting: Nurse Practitioner

## 2012-08-04 VITALS — BP 120/74 | HR 58 | Ht 62.0 in | Wt 220.0 lb

## 2012-08-04 DIAGNOSIS — I4891 Unspecified atrial fibrillation: Secondary | ICD-10-CM

## 2012-08-04 DIAGNOSIS — I480 Paroxysmal atrial fibrillation: Secondary | ICD-10-CM

## 2012-08-04 NOTE — Patient Instructions (Addendum)
Limit caffeine  Try to be active and work on your diet/weight  We will see you back in 6 months  Once your bleeding has resolved, start a baby aspirin  Ok to proceed on with your D & C  Call the [facility] office at [phone] if you have any questions, problems or concerns.

## 2012-08-04 NOTE — Progress Notes (Signed)
Jill Cooke Date of Birth: 09-Feb-1941 Medical Record #[id number]  History of Present Illness: Jill Cooke is seen today for a post hospital visit. She is seen for Jill Cooke. She was recently at [facility] for a D & C due to vaginal bleeding. While there, was noted to be in atrial fib. She was asymptomatic. She converted with IV diltiazem. No aspirin or coumadin was initiated in light of her bleeding. Her echo showed mild LVH, grade 2 diastolic dysfunction and an EF of 60 to 65%. She has no other cardiac history. Low dose aspirin was recommended once her bleeding was resolved.   She comes in today. She is here alone. She is doing well from our standpoint. Her heart rate has remained in the 60's by her BP cuff at home. She is tolerating her metoprolol. She is still having considerable vaginal bleeding. She would like to proceed on with the D & C. No chest pain. Not short of breath. She admits that she likes to eat and understands the importance of weight loss.   Current Outpatient Prescriptions on File Prior to Visit  Medication Sig Dispense Refill  . metoprolol tartrate (LOPRESSOR) 25 MG tablet Take 1 tablet (25 mg total) by mouth 2 (two) times daily.  60 tablet  6    No Known Allergies  Past Medical History  Diagnosis Date  . Endometrial hyperplasia   . Postmenopausal bleeding   . Retroversion of uterus   . Vulvar ulceration   . Anxiety     with  diagnosis  . PAF (paroxysmal atrial fibrillation)     a. 06/2012 Admitted with asymp afib->converted on IV dilt.  No asa/coumadin 2/2 h/o vaginal bleeding pending d/c;  b. 07/20/2012 Echo:  EF 60-65%, Gr2 DD, mild LVH   . Vaginal bleeding     History reviewed. No pertinent past surgical history.  History  Smoking status  . Never Smoker   Smokeless tobacco  . Never Used    History  Alcohol Use  . Yes    ocassional wine or beer   2 x month    History reviewed. No pertinent family history.  Review of Systems: The review of systems  is per the HPI.  All other systems were reviewed and are negative.  Physical Exam: BP 120/74  Pulse 58  Ht 5\' 2"  (1.575 m)  Wt 220 lb (99.791 kg)  BMI 40.24 kg/m2 Patient is very pleasant and in no acute distress. She is obese. Skin is warm and dry. Color is normal.  HEENT is unremarkable. Normocephalic/atraumatic. PERRL. Sclera are nonicteric. Neck is supple. No masses. No JVD. Lungs are clear. Cardiac exam shows a regular rate and rhythm. Abdomen is soft. Extremities are without edema. Gait and ROM are intact. No gross neurologic deficits noted.  LABORATORY DATA:  Lab Results  Component Value Date   WBC 10.4 07/20/2012   HGB 13.5 07/20/2012   HCT 38.8 07/20/2012   PLT 302 07/20/2012   GLUCOSE 109* 07/20/2012   CHOL 191 07/20/2012   TRIG 142 07/20/2012   HDL 37* 07/20/2012   LDLCALC 126* 07/20/2012   ALT 11 07/19/2012   AST 15 07/19/2012   NA 137 07/20/2012   K 3.6 07/20/2012   CL 102 07/20/2012   CREATININE 0.83 07/20/2012   BUN 16 07/20/2012   CO2 30 07/20/2012   TSH 1.379 07/19/2012   INR 1.07 07/19/2012   Echo Study Conclusions  - Left ventricle: The cavity size was normal. Wall thickness  was increased in a pattern of mild LVH. Systolic function was normal. The estimated ejection fraction was in the range of 60% to 65%. Wall motion was normal; there were no regional wall motion abnormalities. Features are consistent with a pseudonormal left ventricular filling pattern, with concomitant abnormal relaxation and increased filling pressure (grade 2 diastolic dysfunction). - Aortic valve: There was no stenosis. - Mitral valve: Trivial regurgitation. - Left atrium: The atrium was mildly dilated. - Right ventricle: The cavity size was normal. Systolic function was normal. - Tricuspid valve: Peak RV-RA gradient: 27mm Hg (S). - Pulmonary arteries: PA peak pressure: 32mm Hg (S). - Inferior vena cava: The vessel was normal in size; the respirophasic diameter changes were in the normal  range (= 50%); findings are consistent with normal central venous pressure.    Assessment / Plan:  PAF - She is staying in sinus by physical exam. She is on low dose BB therapy. Will have her start low dose aspirin once her bleeding has resolved. We will see her back in 6 months.   Vaginal bleeding - This continues to be a significant issue for her. She would like to proceed on with her D & C which I think is ok. She is felt to be stable from our standpoint.  Obesity - We have discussed the need for weight loss/dietary changes.   We will see her back in 6 months. Ok to proceed on with D & C. Patient is agreeable to this plan and will call if any problems develop in the interim.

## 2012-08-08 ENCOUNTER — Encounter (HOSPITAL_COMMUNITY): Payer: Self-pay | Admitting: Pharmacy Technician

## 2012-08-08 ENCOUNTER — Encounter (HOSPITAL_COMMUNITY): Payer: Self-pay | Admitting: *Deleted

## 2012-08-08 NOTE — Progress Notes (Signed)
Notified N WILKERSON that pre op orders will need to be placed in EPIC. Stated CBC, BMET do not need to be repeated.  CBC, BMET done 07/19/12. EKG 07/20/12, eccho 07/30/12, OV with clearance N Garhardt/ Dr Melburn Popper EPIC.  Patient states is not on any blood thinners. Verbalized understanding of med in am and NPO after midnight utilizing teach back method

## 2012-08-09 ENCOUNTER — Encounter (HOSPITAL_COMMUNITY): Payer: Self-pay | Admitting: *Deleted

## 2012-08-09 ENCOUNTER — Ambulatory Visit (HOSPITAL_COMMUNITY): Payer: Medicare Other | Admitting: Anesthesiology

## 2012-08-09 ENCOUNTER — Encounter (HOSPITAL_COMMUNITY): Payer: Self-pay | Admitting: Anesthesiology

## 2012-08-09 ENCOUNTER — Encounter (HOSPITAL_COMMUNITY)
Admission: RE | Disposition: A | Discharge status: Home / Self Care | Payer: Self-pay | Source: Ambulatory Visit | Attending: Gynecologic Oncology

## 2012-08-09 ENCOUNTER — Ambulatory Visit (HOSPITAL_COMMUNITY)
Admission: RE | Admit: 2012-08-09 | Discharge: 2012-08-09 | Disposition: A | Discharge status: Home / Self Care | Payer: Medicare Other | Source: Ambulatory Visit | Attending: Gynecologic Oncology | Admitting: Gynecologic Oncology

## 2012-08-09 DIAGNOSIS — N950 Postmenopausal bleeding: Secondary | ICD-10-CM | POA: Insufficient documentation

## 2012-08-09 DIAGNOSIS — N882 Stricture and stenosis of cervix uteri: Secondary | ICD-10-CM | POA: Insufficient documentation

## 2012-08-09 DIAGNOSIS — C549 Malignant neoplasm of corpus uteri, unspecified: Secondary | ICD-10-CM | POA: Insufficient documentation

## 2012-08-09 DIAGNOSIS — N814 Uterovaginal prolapse, unspecified: Secondary | ICD-10-CM | POA: Insufficient documentation

## 2012-08-09 HISTORY — PX: DILATION AND CURETTAGE OF UTERUS: SHX78

## 2012-08-09 SURGERY — DILATION AND CURETTAGE
Anesthesia: General | Wound class: Clean Contaminated

## 2012-08-09 MED ORDER — FENTANYL CITRATE 0.05 MG/ML IJ SOLN
INTRAMUSCULAR | Status: DC | PRN
  Administered 2012-08-09 (×2): 50 ug via INTRAVENOUS

## 2012-08-09 MED ORDER — LACTATED RINGERS IV SOLN
INTRAVENOUS | Status: DC
  Administered 2012-08-09: 1000 mL via INTRAVENOUS

## 2012-08-09 MED ORDER — 0.9 % SODIUM CHLORIDE (POUR BTL) OPTIME
TOPICAL | Status: DC | PRN
  Administered 2012-08-09: 1000 mL

## 2012-08-09 MED ORDER — EPHEDRINE SULFATE 50 MG/ML IJ SOLN
INTRAMUSCULAR | Status: DC | PRN
  Administered 2012-08-09 (×2): 5 mg via INTRAVENOUS

## 2012-08-09 MED ORDER — LACTATED RINGERS IV SOLN: INTRAVENOUS | Status: DC

## 2012-08-09 MED ORDER — GLYCOPYRROLATE 0.2 MG/ML IJ SOLN
INTRAMUSCULAR | Status: DC | PRN
  Administered 2012-08-09: 0.2 mg via INTRAVENOUS

## 2012-08-09 MED ORDER — PROPOFOL 10 MG/ML IV BOLUS
INTRAVENOUS | Status: DC | PRN
  Administered 2012-08-09: 120 mg via INTRAVENOUS

## 2012-08-09 MED ORDER — FENTANYL CITRATE 0.05 MG/ML IJ SOLN: 25.0000 ug | INTRAMUSCULAR | Status: DC | PRN

## 2012-08-09 SURGICAL SUPPLY — 11 items
CATH ROBINSON RED A/P 16FR (CATHETERS) ×2 IMPLANT
CLOTH BEACON ORANGE TIMEOUT ST (SAFETY) ×2 IMPLANT
DRESSING TELFA 8X3 (GAUZE/BANDAGES/DRESSINGS) ×1 IMPLANT
GLOVE BIO SURGEON STRL SZ7.5 (GLOVE) ×4 IMPLANT
GLOVE INDICATOR 8.0 STRL GRN (GLOVE) ×2 IMPLANT
NDL SPNL 22GX3.5 QUINCKE BK (NEEDLE) ×1 IMPLANT
NEEDLE SPNL 22GX3.5 QUINCKE BK (NEEDLE) IMPLANT
PACK MINOR VAGINAL W LONG (CUSTOM PROCEDURE TRAY) ×2 IMPLANT
SYR CONTROL 10ML LL (SYRINGE) ×2 IMPLANT
UNDERPAD 30X30 INCONTINENT (UNDERPADS AND DIAPERS) ×2 IMPLANT
WATER STERILE IRR 1500ML POUR (IV SOLUTION) ×2 IMPLANT

## 2012-08-09 NOTE — Anesthesia Preprocedure Evaluation (Signed)
Anesthesia Evaluation  Patient identified by MRN, date of birth, ID band Patient awake    Reviewed: Allergy & Precautions, H&P , NPO status , Patient's Chart, lab work & pertinent test results, reviewed documented beta blocker date and time   Airway Mallampati: II TM Distance: >3 FB Neck ROM: full    Dental No notable dental hx.    Pulmonary neg pulmonary ROS,  breath sounds clear to auscultation  Pulmonary exam normal       Cardiovascular Exercise Tolerance: Good negative cardio ROS  + dysrhythmias Atrial Fibrillation Rhythm:regular Rate:Normal     Neuro/Psych negative neurological ROS  negative psych ROS   GI/Hepatic negative GI ROS, Neg liver ROS,   Endo/Other  negative endocrine ROS  Renal/GU negative Renal ROS  negative genitourinary   Musculoskeletal   Abdominal   Peds  Hematology negative hematology ROS (+)   Anesthesia Other Findings   Reproductive/Obstetrics negative OB ROS                           Anesthesia Physical Anesthesia Plan  ASA: III  Anesthesia Plan: General   Post-op Pain Management:    Induction: Intravenous  Airway Management Planned: LMA  Additional Equipment:   Intra-op Plan:   Post-operative Plan:   Informed Consent: I have reviewed the patients History and Physical, chart, labs and discussed the procedure including the risks, benefits and alternatives for the proposed anesthesia with the patient or authorized representative who has indicated his/her understanding and acceptance.   Dental Advisory Given  Plan Discussed with: CRNA and Surgeon  Anesthesia Plan Comments:         Anesthesia Quick Evaluation

## 2012-08-09 NOTE — H&P (Addendum)
PRE OP NOTE:  CC: Postmenopausal bleeding.   HPI: 71 y/o G2P2 LNMP at 71 years old. Patient noted clear vaginal discharge in JN that progressed to bleeding the following month. Seen by Jill Cooke in 01/2012 and atrophic vaginitis suspected. Vaginal bleeding continued and she was referred to Jill Cooke, Treated with (?) Megace and antibiotics with out improvement. Pelvic UTZ 04/2012 c/w 2cm endometrial stripe and retroverted uterus. EMB unsuccesful. She ws taken to the OR by Jill Cooke on 06/24/2012. D&C hysteroscopy was not feasible because of severe cervical stenosis noted.   Current Meds:     Medication  Sig  Dispense  Refill   .  Calcium Carbonate-Vitamin D (CALCIUM + D PO)  Take by mouth.     .  fish oil-omega-3 fatty acids 1000 MG capsule  Take by mouth daily.     Marland Kitchen  VITAMIN D, CHOLECALCIFEROL, PO  Take by mouth.     .  clobetasol cream (TEMOVATE) 0.05 %  Apply topically. Apply to affected area on vulva daily until symptoms resolve and then 3 times per week. If no symptoms, may stop using     Allergy: No Known Allergies   Social Hx:  History    Social History   .  Marital Status:  Married     Spouse Name:  N/A     Number of Children:  N/A   .  Years of Education:  N/A    Occupational History   .  Not on file.    Social History Main Topics   .  Smoking status:  Never Smoker   .  Smokeless tobacco:  Not on file   .  Alcohol Use:  Yes      ocassional wine or beer   .  Drug Use:  No   .  Sexually Active:  No    Other Topics  Concern   .  Not on file    Social History Narrative   .  No narrative on file   Past Surgical Hx: No past surgical history on file.  Past Medical Hx:  Past Medical History   Diagnosis  Date   .  Endometrial hyperplasia    .  Postmenopausal bleeding    .  Retroversion of uterus    .  Vulvar ulceration    Past Gynecological History: G2P2 Menarche 9 years. Menopause 47 years. No h/o abnormal pap. No LMP recorded. Patient is postmenopausal.  Family  Hx: No family history on file.  Review of Systems:  Constitutional  Feels well,Cardiovascular  No chest pain, shortness of breath,  Pulmonary  No cough or wheeze.  Gastro Intestinal  No nausea, vomitting, or diarrhoea. No bright red blood per rectum, no abdominal pain, change in bowel movement, or constipation.  Genito Urinary  No frequency, urgency, dysuria, vaginal spotting and intermittent clear vaginal discharge.  Musculo Skeletal  No myalgia, arthralgia, joint swelling or pain  Neurologic  No weakness, numbness, change in gait,  Psychology  No depression, anxiety, insomnia.  Vitals: Blood pressure 154/90, pulse 72, temperature 98.5 F (36.9 C), temperature source Oral, resp. rate 16, height 5' 2.91" (1.598 m), weight 217 lb 8 oz (98.657 kg).  Physical Exam:  WD in NAD  Neck  Supple NROM, without any enlargements.  Lymph Node Survey  No cervical supraclavicular or inguinal adenopathy  Cardiovascular  Pulse normal rate, regularity and rhythm. S1 and S2 normal.  Lungs  Clear to auscultation bilateraly, without wheezes/crackles/rhonchi. Good air movement.  Skin  No rash/lesions/breakdown  Psychiatry  Alert and oriented to person, place, and time  Abdomen  Normoactive bowel sounds, abdomen soft, non-tender and obese. Surgical sites intact without evidence of hernia.  Back  No CVA tenderness  Genito Urinary  Vulva/vagina: Normal external female genitalia. No lesions. No discharge or bleeding.  Bladder/urethra: No lesions or masses  Vagina:atrophic, no blood or discharge is visible.  Cervix: Normal appearing, no lesions.Unable to enter the internal os.  Uterus: Small, mobile, no parametrial involvement or nodularity.  Adnexa: No palpable masses.  Rectal  Good tone, no masses no cul de sac nodularity.  Extremities  No bilateral cyanosis, clubbing or edema.   Assessment/Plan:  [name] is a 71 y.o. year old with postmenopausal bleeding and cervical stenosis. On  UTZ the uterus is retroverted with a 2.0cm endometrial stripe. Benign endocervical material obtained at previous attempts to assess the uterus. An unsuccessful attempt was made to sample the uterine cavity at the initial visit. Differential diagnosis is neoplasm versus an endometrial polyp. Plan for uterine curettage today.  Patient administered  cytotec in the vagina.   Surgery delayed from 07/19/2012 because of interm a-fib.   Surgical procedure is uterine  Curettage.  With some benefits of the procedure were readdressed with the patient and her questions answered

## 2012-08-09 NOTE — Anesthesia Postprocedure Evaluation (Signed)
  Anesthesia Post-op Note  Patient: [name]  Procedure(s) Performed: Procedure(s) (LRB): DILATATION AND CURETTAGE (N/A)  Patient Location: PACU  Anesthesia Type: General  Level of Consciousness: awake and alert   Airway and Oxygen Therapy: Patient Spontanous Breathing  Post-op Pain: mild  Post-op Assessment: Post-op Vital signs reviewed, Patient's Cardiovascular Status Stable, Respiratory Function Stable, Patent Airway and No signs of Nausea or vomiting  Post-op Vital Signs: stable  Complications: No apparent anesthesia complications

## 2012-08-09 NOTE — Transfer of Care (Signed)
Immediate Anesthesia Transfer of Care Note  Patient: Jill Cooke  Procedure(s) Performed: Procedure(s) (LRB) with comments: DILATATION AND CURETTAGE (N/A)  Patient Location: PACU  Anesthesia Type: General  Level of Consciousness: awake, alert , oriented and patient cooperative  Airway & Oxygen Therapy: Patient Spontanous Breathing and Patient connected to face mask oxygen  Post-op Assessment: Report given to PACU RN, Post -op Vital signs reviewed and stable and Patient moving all extremities  Post vital signs: Reviewed and stable  Complications: No apparent anesthesia complications

## 2012-08-09 NOTE — Op Note (Signed)
Preoperative Diagnosis: Post menopausal bleeding Cervical stenosis  Postoperative Diagnosis: Same  Procedure(s) Performed: cervical dilatation, endocervical curettage, endometrial curettage  Surgeon: Maryclare Labrador.  Nelly Rout, M.D. PhD  Anesthesia: GET    Specimens: Endocervical curetting, endometrial curettings  Estimated Blood Loss:  minimal   Indication for Procedure:  71 year old with postmenopausal bleeding, thickened uterine stripe and cervical stenosis  Operative Findings: Cervix enlarged to 5 cm.  No gross lesions, no cul de sac nodularity.  Unable to assess adnexae because of the body habitus.  Uterus sounded to 10cm.  Uterine prolapse to almost the introitus.   Procedure: Patient was taken to the operating room and placed under general anesthesia.  She was placed in the dorsal lithotomy position and prepped and draped in the usual sterile fashion.  The cervidil tablet was removed.  The cervix was dilated  And an ECC collected without difficulty.  The uterus sounded to 10 cm.  Uterine curettings were collected with return of copious material.  No polyps were identified when the uterine polyp forceps were inserted.   Marland Kitchen    Sponge, lap and needle counts were correct x 3.    Complications:None  The patient had sequential compression devices for VTE prophylaxis         Disposition: Recovery room         Condition: Stable

## 2012-08-10 ENCOUNTER — Encounter (HOSPITAL_COMMUNITY): Payer: Self-pay | Admitting: Gynecologic Oncology

## 2012-08-17 ENCOUNTER — Telehealth: Payer: Self-pay | Admitting: *Deleted

## 2012-08-17 NOTE — Telephone Encounter (Signed)
Patient notified of Path results.  F/U appt 08/18/12 @ 1030

## 2012-08-18 ENCOUNTER — Encounter: Payer: Self-pay | Admitting: Gynecologic Oncology

## 2012-08-18 ENCOUNTER — Ambulatory Visit: Payer: Medicare Other | Attending: Gynecologic Oncology | Admitting: Gynecologic Oncology

## 2012-08-18 ENCOUNTER — Other Ambulatory Visit: Payer: Self-pay | Admitting: Gynecologic Oncology

## 2012-08-18 VITALS — BP 158/82 | HR 78 | Temp 98.2°F | Resp 20 | Ht 62.91 in | Wt 216.9 lb

## 2012-08-18 DIAGNOSIS — C549 Malignant neoplasm of corpus uteri, unspecified: Secondary | ICD-10-CM | POA: Insufficient documentation

## 2012-08-18 DIAGNOSIS — C541 Malignant neoplasm of endometrium: Secondary | ICD-10-CM

## 2012-08-18 MED ORDER — MISOPROSTOL 200 MCG PO TABS: 200.0000 ug | ORAL_TABLET | Freq: Once | ORAL | Status: DC

## 2012-08-18 NOTE — Patient Instructions (Addendum)
Grade 2 endometrial adenocarcinoma.    Surgical staging is recommended. Open staging versus a minimally invasive approach were presented to the patient.  I extensively reviewed the risks of robotic hysterectomy and possible lymph node dissection, of infection, bleeding, damage to nearby organs including bowel, bladder, vessels, nerves, and ureters. We discussed postoperative risks including infection.  We reviewed possible need for conversion to open laparotomy, need for possible blood transfusion. I discussed positioning during surgery of trendelenberg and risks of minor facial swelling and care we take in preoperative positioning. We also reviewed possible need for further treatment such as radiation or chemotherapy based on final pathology. Expected postoperative recovery was also discussed.   The proposed procedure is a robotic-assisted total laparoscopic hysterectomy bilateral salpingo-oophorectomy bilateral pelvic and possible periaortic lymph node dissection, and other indicated procedures Patient advised to administer cytotec in the vagina the evening prior.   The surgeon will be Dr. Cleda Mccreedy.   Thank you very much [name] for allowing me to provide care for you today.  I appreciate your confidence in choosing our Gynecologic Oncology team.  If you have any questions about your visit today please call our office and we will get back to you as soon as possible.  Maryclare Labrador. Ennio Houp MD., PhD Gynecologic Oncology

## 2012-08-18 NOTE — Progress Notes (Signed)
Pt informed of Dr. Forrestine Him recommendations to place Cytotec 200 mcg tablet in the vagina on the evening of Oct 14 prior to surgery on Oct 15.  Medication sent to [facility] on [address] in [place].  Pt verbalizing understanding.  Instructed to call for any questions or concerns.

## 2012-08-18 NOTE — Progress Notes (Signed)
Office Visit Note: Gyn-Onc   Jill Cooke 71 y.o. female  ZO:[id number] cancer grade 2  HPI:  71 y/o G2P2 LNMP at 71 years old.    Patient noted clear vaginal discharge in JN that progressed to bleeding the following month.  Seen by Dr. Isabella Stalling in 01/2012 and atrophic vaginitis suspected.  Vaginal bleeding continued and she was referred to Dr. Elmore Guise,  Treated with (?) Megace and antibiotics with out improvement.  Pelvic UTZ 04/2012 c/w 2cm endometrial stripe and retroverted uterus. EMB unsuccesful.  She ws taken to the OR by Dr. Elmore Guise on  06/24/2012.  D&C hysteroscopy was not feasible because of severe cervical stenosis noted.     INTERVAL HISTORY Patient was administered cytotec vaginally and underwent uterine dilatation and curettage under anesthesia 08/09/2012. Operative findings: Cervix enlarged to 5 cm. No gross lesions, no cul de sac nodularity. Unable to assess adnexae because of the body habitus. Uterus sounded to 10cm. Uterine prolapse to almost the introitus  Pathology  1. Endocervix, curettage - SCANT BENIGN ENDOCERVIX AND BENIGN SQUAMOUS FRAGMENTS. 2. Endometrium, curettage - ADENOCARCINOMA WITH EXTENSIVE SQUAMOUS DIFFERENTIATION. Microscopic Comment There is mucus and fragments of adenocarcinoma with extensive squamous differentiation. There are focal areas with solid growth pattern and the features favor FIGO grade 2.  Current Meds:  Outpatient Encounter Prescriptions as of 08/18/2012  Medication Sig Dispense Refill  . metoprolol tartrate (LOPRESSOR) 25 MG tablet Take 1 tablet (25 mg total) by mouth 2 (two) times daily.  60 tablet  6  . misoprostol (CYTOTEC) 200 MCG tablet Take 200 mcg by mouth once. Insert vaginally only 1 time on 08/08/12        Allergy: No Known Allergies  Social Hx:   History   Social History  . Marital Status: Married    Spouse Name: N/A    Number of Children: N/A  . Years of Education: N/A   Occupational History  . Not on file.   Social History  Main Topics  . Smoking status: Never Smoker   . Smokeless tobacco: Never Used  . Alcohol Use: Yes     ocassional wine or beer   2 x month  . Drug Use: No  . Sexually Active: No   Other Topics Concern  . Not on file   Social History Narrative  . No narrative on file    Past Surgical Hx:  Past Surgical History  Procedure Date  . Dilation and curettage of uterus 08/09/2012    Procedure: DILATATION AND CURETTAGE;  Surgeon: Laurette Schimke, MD PHD;  Location: [facility];  Service: Gynecology;  Laterality: N/A;    Past Medical Hx:  Past Medical History  Diagnosis Date  . Endometrial hyperplasia   . Postmenopausal bleeding   . Retroversion of uterus   . Vulvar ulceration   . Anxiety     with  diagnosis  . PAF (paroxysmal atrial fibrillation)     a. 06/2012 Admitted with asymp afib->converted on IV dilt.  No asa/coumadin 2/2 h/o vaginal bleeding pending d/c;  b. 07/20/2012 Echo:  EF 60-65%, Gr2 DD, mild LVH   . Vaginal bleeding   Endometrial cancer dx 07/2012  Past Gynecological History: G2P2 Menarche 9 years. Menopause 47 years.  No h/o abnormal pap. No LMP recorded. Patient is postmenopausal.  Family Hx: No family history on file.  Review of Systems:  Constitutional  Feels well,Cardiovascular  No chest pain, shortness of breath, Pulmonary  No cough or wheeze.  Gastro Intestinal  No nausea, vomitting,  or diarrhoea. No bright red blood per rectum, no abdominal pain, change in bowel movement, or constipation.  Genito Urinary  No frequency, urgency, dysuria, vaginal spotting. Musculo Skeletal  No myalgia, arthralgia, joint swelling or pain  Neurologic  No weakness, numbness, change in gait,  Psychology  No depression, anxiety, insomnia.   Vitals:  Blood pressure 158/82, pulse 78, temperature 98.2 F (36.8 C), temperature source Oral, resp. rate 20, height 5' 2.91" (1.598 m), weight 216 lb 14.4 oz (98.385 kg).Body mass index is 38.53 kg/(m^2).   Physical Exam: WD in  NAD Neck  Supple NROM, without any enlargements.  Lymph Node Survey No cervical supraclavicular or inguinal adenopathy Cardiovascular  Pulse normal rate, regularity and rhythm. S1 and S2 normal.  Lungs  Clear to auscultation bilaterally.  Good air movement.  Skin  No rash/lesions/breakdown  Psychiatry  Alert and oriented to person, place, and time  Abdomen  Normoactive bowel sounds, abdomen soft, non-tender and obese. Surgical  sites intact without evidence of hernia.  Back No CVA tenderness Extremities  No bilateral cyanosis, clubbing or edema.   Assessment/Plan:  Jill Cooke  is a 71 y.o.  year old with postmenopausal bleeding and cervical stenosis. Patient underwent uterine curettage under anesthesia 08/09/2012.  Uterus sounded to 10cm , significant uterine descensus appreciated.  Pathology c/w grade 2 endometrial adenocarcinoma.   Surgical staging was recommended. All opened staging versus a minimally invasive approach were presented to the patient.  I extensively reviewed the risks of robotic hysterectomy and possible lymph node dissection, of infection, bleeding, damage to nearby organs including bowel, bladder, vessels, nerves, and ureters. We discussed postoperative risks including infection.  We reviewed possible need for conversion to open laparotomy, need for possible blood transfusion. I discussed positioning during surgery of trendelenberg and risks of minor facial swelling and care we take in preoperative positioning. We also reviewed possible need for further treatment such as radiation or chemotherapy based on final pathology. Expected postoperative recovery was also discussed.   The proposed procedure is a robotic-assisted total laparoscopic hysterectomy bilateral salpingo-oophorectomy bilateral pelvic and possible periaortic lymph node dissection, and other indicated procedures Patient advised to administer cytotec in the vagina the evening prior. The patient is aware  that the surgeon will be Dr. Cleda Mccreedy. All of her questions and those of her husband were answered to her satisfaction.  Laurette Schimke, MD, PhD 08/18/2012, 10:46 AM

## 2012-09-02 ENCOUNTER — Encounter (HOSPITAL_COMMUNITY): Payer: Self-pay | Admitting: Pharmacy Technician

## 2012-09-09 ENCOUNTER — Encounter (HOSPITAL_COMMUNITY): Payer: Self-pay

## 2012-09-09 ENCOUNTER — Ambulatory Visit (HOSPITAL_COMMUNITY)
Admission: RE | Admit: 2012-09-09 | Discharge: 2012-09-09 | Disposition: A | Discharge status: Home / Self Care | Payer: Medicare Other | Source: Ambulatory Visit | Attending: Gynecologic Oncology | Admitting: Gynecologic Oncology

## 2012-09-09 ENCOUNTER — Encounter (HOSPITAL_COMMUNITY)
Admission: RE | Admit: 2012-09-09 | Discharge: 2012-09-09 | Disposition: A | Discharge status: Home / Self Care | Payer: Medicare Other | Source: Ambulatory Visit | Attending: Gynecologic Oncology | Admitting: Gynecologic Oncology

## 2012-09-09 DIAGNOSIS — C549 Malignant neoplasm of corpus uteri, unspecified: Secondary | ICD-10-CM | POA: Insufficient documentation

## 2012-09-09 DIAGNOSIS — Z01812 Encounter for preprocedural laboratory examination: Secondary | ICD-10-CM | POA: Insufficient documentation

## 2012-09-09 DIAGNOSIS — I77810 Thoracic aortic ectasia: Secondary | ICD-10-CM | POA: Insufficient documentation

## 2012-09-09 DIAGNOSIS — Z01818 Encounter for other preprocedural examination: Secondary | ICD-10-CM | POA: Insufficient documentation

## 2012-09-09 HISTORY — DX: Malignant (primary) neoplasm, unspecified: C80.1

## 2012-09-09 LAB — CBC WITH DIFFERENTIAL/PLATELET
Eosinophils Absolute: 0.1 10*3/uL (ref 0.0–0.7)
HCT: 41.6 % (ref 36.0–46.0)
Hemoglobin: 13.9 g/dL (ref 12.0–15.0)
Lymphs Abs: 2.2 10*3/uL (ref 0.7–4.0)
MCH: 30.3 pg (ref 26.0–34.0)
MCV: 90.6 fL (ref 78.0–100.0)
Monocytes Relative: 5 % (ref 3–12)
Neutrophils Relative %: 74 % (ref 43–77)
RBC: 4.59 MIL/uL (ref 3.87–5.11)

## 2012-09-09 LAB — COMPREHENSIVE METABOLIC PANEL
Alkaline Phosphatase: 105 U/L (ref 39–117)
BUN: 15 mg/dL (ref 6–23)
GFR calc Af Amer: >90 mL/min (ref >90)
Glucose, Bld: 104 mg/dL — ABNORMAL HIGH (ref 70–99)
Potassium: 3.8 mEq/L (ref 3.5–5.1)
Total Bilirubin: 0.3 mg/dL (ref 0.3–1.2)
Total Protein: 7.4 g/dL (ref 6.0–8.3)

## 2012-09-09 LAB — SURGICAL PCR SCREEN: MRSA, PCR: NEGATIVE

## 2012-09-09 NOTE — Patient Instructions (Addendum)
YOUR SURGERY IS SCHEDULED AT [facility]  ON:  Tuesday  10/15  AT 1:50 PM  REPORT TO [place] [facility] AT:  11:15 AM      PHONE # FOR SHORT STAY IS [phone]              CLEAR LIQUID DIET ALL DAY Monday --THE DAY BEFORE YOUR SURGERY -- SEE ATTACHED LIST OF CLEAR LIQUIDS.  DO NOT EAT OR DRINK ANYTHING AFTER MIDNIGHT THE NIGHT BEFORE YOUR SURGERY.  YOU MAY BRUSH YOUR TEETH, RINSE OUT YOUR MOUTH--BUT NO WATER, NO FOOD, NO CHEWING GUM, NO MINTS, NO CANDIES, NO CHEWING TOBACCO.  PLEASE TAKE THE FOLLOWING MEDICATIONS THE AM OF YOUR SURGERY WITH A FEW SIPS OF WATER:  METOPROLOL   IF YOU USE INHALERS--USE YOUR INHALERS THE AM OF YOUR SURGERY AND BRING INHALERS TO THE HOSPITAL -TAKE TO SURGERY.    IF YOU ARE DIABETIC:  DO NOT TAKE ANY DIABETIC MEDICATIONS THE AM OF YOUR SURGERY.  IF YOU TAKE INSULIN IN THE EVENINGS--PLEASE ONLY TAKE 1/2 NORMAL EVENING DOSE THE NIGHT BEFORE YOUR SURGERY.  NO INSULIN THE AM OF YOUR SURGERY.  IF YOU HAVE SLEEP APNEA AND USE CPAP OR BIPAP--PLEASE BRING THE MASK AND THE TUBING.  DO NOT BRING YOUR MACHINE.  AFTER SURGERY - REMEMBER TO TURN FREQUENTLY WHEN IN BED-NOT TO LIE IN ONE POSITION ALL THE TIME.  MOVING YOUR LEGS UP AND DOWN HELPS CIRCULATION.  REMEMBER TO DO DEEP BREATHING  EXERCISES EVERY COUPLE OF HOURS AFTER YOUR SURGERY -WHILE AWAKE.  DO NOT BRING VALUABLES, MONEY, CREDIT CARDS.  DO NOT WEAR JEWELRY, MAKE-UP, NAIL POLISH AND NO METAL PINS OR CLIPS IN YOUR HAIR. CONTACT LENS, DENTURES / PARTIALS, GLASSES SHOULD NOT BE WORN TO SURGERY AND IN MOST CASES-HEARING AIDS WILL NEED TO BE REMOVED.  BRING YOUR GLASSES CASE, ANY EQUIPMENT NEEDED FOR YOUR CONTACT LENS. FOR PATIENTS ADMITTED TO THE HOSPITAL--CHECK OUT TIME THE DAY OF DISCHARGE IS 11:00 AM.  ALL INPATIENT ROOMS ARE PRIVATE - WITH BATHROOM, TELEPHONE, TELEVISION AND WIFI INTERNET.                                     PLEASE READ OVER ANY  FACT SHEETS THAT YOU WERE GIVEN: MRSA  INFORMATION, BLOOD TRANSFUSION INFORMATION

## 2012-09-09 NOTE — Pre-Procedure Instructions (Signed)
CBC, DIFF, CMET, T/S, CXR WERE DONE TODAY AT [facility] AS PER ORDERS DR. Duard Brady.  PT HAS EKG REPORT IN EPIC FROM 07/20/12.

## 2012-09-13 ENCOUNTER — Encounter (HOSPITAL_COMMUNITY): Payer: Self-pay | Admitting: *Deleted

## 2012-09-13 ENCOUNTER — Encounter (HOSPITAL_COMMUNITY): Payer: Self-pay | Admitting: Certified Registered Nurse Anesthetist

## 2012-09-13 ENCOUNTER — Ambulatory Visit (HOSPITAL_COMMUNITY): Payer: Medicare Other | Admitting: Certified Registered Nurse Anesthetist

## 2012-09-13 ENCOUNTER — Encounter (HOSPITAL_COMMUNITY)
Admission: RE | Disposition: A | Discharge status: Home / Self Care | Payer: Self-pay | Source: Ambulatory Visit | Attending: Gynecologic Oncology

## 2012-09-13 ENCOUNTER — Ambulatory Visit (HOSPITAL_COMMUNITY)
Admission: RE | Admit: 2012-09-13 | Discharge: 2012-09-14 | Disposition: A | Discharge status: Home / Self Care | Payer: Medicare Other | Source: Ambulatory Visit | Attending: Gynecologic Oncology | Admitting: Gynecologic Oncology

## 2012-09-13 DIAGNOSIS — Z79899 Other long term (current) drug therapy: Secondary | ICD-10-CM | POA: Insufficient documentation

## 2012-09-13 DIAGNOSIS — C549 Malignant neoplasm of corpus uteri, unspecified: Secondary | ICD-10-CM | POA: Insufficient documentation

## 2012-09-13 DIAGNOSIS — Z23 Encounter for immunization: Secondary | ICD-10-CM | POA: Insufficient documentation

## 2012-09-13 DIAGNOSIS — C541 Malignant neoplasm of endometrium: Secondary | ICD-10-CM | POA: Diagnosis present

## 2012-09-13 DIAGNOSIS — Z01812 Encounter for preprocedural laboratory examination: Secondary | ICD-10-CM | POA: Insufficient documentation

## 2012-09-13 HISTORY — PX: ROBOTIC ASSISTED LAP VAGINAL HYSTERECTOMY: SHX2362

## 2012-09-13 HISTORY — PX: LYMPH NODE DISSECTION: SHX5087

## 2012-09-13 LAB — TYPE AND SCREEN: ABO/RH(D): A POS

## 2012-09-13 SURGERY — ROBOTIC ASSISTED LAPAROSCOPIC VAGINAL HYSTERECTOMY
Anesthesia: General | Site: Abdomen | Wound class: Clean Contaminated

## 2012-09-13 MED ORDER — METOPROLOL TARTRATE 25 MG PO TABS
25.0000 mg | ORAL_TABLET | Freq: Two times a day (BID) | ORAL | Status: DC
  Administered 2012-09-13: 25 mg via ORAL
  Filled 2012-09-13 (×3): qty 1

## 2012-09-13 MED ORDER — NEOSTIGMINE METHYLSULFATE 1 MG/ML IJ SOLN
INTRAMUSCULAR | Status: DC | PRN
  Administered 2012-09-13: 5 mg via INTRAVENOUS

## 2012-09-13 MED ORDER — PROMETHAZINE HCL 25 MG/ML IJ SOLN: 6.2500 mg | INTRAMUSCULAR | Status: DC | PRN

## 2012-09-13 MED ORDER — KETOROLAC TROMETHAMINE 30 MG/ML IJ SOLN
15.0000 mg | Freq: Four times a day (QID) | INTRAMUSCULAR | Status: AC
  Administered 2012-09-13 – 2012-09-14 (×3): 15 mg via INTRAVENOUS
  Filled 2012-09-13 (×3): qty 1

## 2012-09-13 MED ORDER — MIDAZOLAM HCL 5 MG/5ML IJ SOLN
INTRAMUSCULAR | Status: DC | PRN
  Administered 2012-09-13: 2 mg via INTRAVENOUS

## 2012-09-13 MED ORDER — ONDANSETRON HCL 4 MG/2ML IJ SOLN
INTRAMUSCULAR | Status: DC | PRN
  Administered 2012-09-13: 4 mg via INTRAVENOUS

## 2012-09-13 MED ORDER — HYDROMORPHONE HCL PF 1 MG/ML IJ SOLN: 0.2500 mg | INTRAMUSCULAR | Status: DC | PRN

## 2012-09-13 MED ORDER — ACETAMINOPHEN 10 MG/ML IV SOLN
INTRAVENOUS | Status: DC | PRN
  Administered 2012-09-13: 1000 mg via INTRAVENOUS

## 2012-09-13 MED ORDER — PROPOFOL 10 MG/ML IV BOLUS
INTRAVENOUS | Status: DC | PRN
  Administered 2012-09-13: 150 mg via INTRAVENOUS

## 2012-09-13 MED ORDER — SUCCINYLCHOLINE CHLORIDE 20 MG/ML IJ SOLN
INTRAMUSCULAR | Status: DC | PRN
  Administered 2012-09-13: 100 mg via INTRAVENOUS

## 2012-09-13 MED ORDER — ZOLPIDEM TARTRATE 5 MG PO TABS: 5.0000 mg | ORAL_TABLET | Freq: Every evening | ORAL | Status: DC | PRN

## 2012-09-13 MED ORDER — CEFAZOLIN SODIUM-DEXTROSE 2-3 GM-% IV SOLR
2.0000 g | INTRAVENOUS | Status: AC
  Administered 2012-09-13: 2 g via INTRAVENOUS

## 2012-09-13 MED ORDER — MEPERIDINE HCL 50 MG/ML IJ SOLN: 6.2500 mg | INTRAMUSCULAR | Status: DC | PRN

## 2012-09-13 MED ORDER — DEXAMETHASONE SODIUM PHOSPHATE 10 MG/ML IJ SOLN
INTRAMUSCULAR | Status: DC | PRN
  Administered 2012-09-13: 10 mg via INTRAVENOUS

## 2012-09-13 MED ORDER — HEMOSTATIC AGENTS (NO CHARGE) OPTIME
TOPICAL | Status: DC | PRN
  Administered 2012-09-13: 1 via TOPICAL

## 2012-09-13 MED ORDER — FENTANYL CITRATE 0.05 MG/ML IJ SOLN
INTRAMUSCULAR | Status: DC | PRN
  Administered 2012-09-13 (×5): 50 ug via INTRAVENOUS

## 2012-09-13 MED ORDER — KCL IN DEXTROSE-NACL 20-5-0.45 MEQ/L-%-% IV SOLN
INTRAVENOUS | Status: DC
  Administered 2012-09-13 – 2012-09-14 (×2): via INTRAVENOUS
  Filled 2012-09-13 (×4): qty 1000

## 2012-09-13 MED ORDER — LIDOCAINE HCL (CARDIAC) 20 MG/ML IV SOLN
INTRAVENOUS | Status: DC | PRN
  Administered 2012-09-13: 100 mg via INTRAVENOUS

## 2012-09-13 MED ORDER — LACTATED RINGERS IV SOLN
INTRAVENOUS | Status: DC | PRN
  Administered 2012-09-13: 1000 mL

## 2012-09-13 MED ORDER — ACETAMINOPHEN 10 MG/ML IV SOLN
INTRAVENOUS | Status: AC
  Filled 2012-09-13: qty 100

## 2012-09-13 MED ORDER — PNEUMOCOCCAL VAC POLYVALENT 25 MCG/0.5ML IJ INJ
0.5000 mL | INJECTION | INTRAMUSCULAR | Status: AC
  Administered 2012-09-14: 0.5 mL via INTRAMUSCULAR
  Filled 2012-09-13: qty 0.5

## 2012-09-13 MED ORDER — HYDRALAZINE HCL 20 MG/ML IJ SOLN
INTRAMUSCULAR | Status: DC | PRN
  Administered 2012-09-13: 4 mg via INTRAVENOUS

## 2012-09-13 MED ORDER — ONDANSETRON HCL 4 MG/2ML IJ SOLN: 4.0000 mg | Freq: Four times a day (QID) | INTRAMUSCULAR | Status: DC | PRN

## 2012-09-13 MED ORDER — HYDROMORPHONE HCL PF 1 MG/ML IJ SOLN
INTRAMUSCULAR | Status: DC | PRN
  Administered 2012-09-13: 1 mg via INTRAVENOUS
  Administered 2012-09-13 (×2): 0.5 mg via INTRAVENOUS

## 2012-09-13 MED ORDER — LACTATED RINGERS IV SOLN
INTRAVENOUS | Status: DC
  Administered 2012-09-13: 13:00:00 via INTRAVENOUS

## 2012-09-13 MED ORDER — ROCURONIUM BROMIDE 100 MG/10ML IV SOLN
INTRAVENOUS | Status: DC | PRN
  Administered 2012-09-13: 50 mg via INTRAVENOUS
  Administered 2012-09-13: 10 mg via INTRAVENOUS

## 2012-09-13 MED ORDER — GLYCOPYRROLATE 0.2 MG/ML IJ SOLN
INTRAMUSCULAR | Status: DC | PRN
  Administered 2012-09-13: 0.6 mg via INTRAVENOUS

## 2012-09-13 MED ORDER — MORPHINE SULFATE 2 MG/ML IJ SOLN: 2.0000 mg | INTRAMUSCULAR | Status: DC | PRN

## 2012-09-13 MED ORDER — CEFAZOLIN SODIUM-DEXTROSE 2-3 GM-% IV SOLR
INTRAVENOUS | Status: AC
  Filled 2012-09-13: qty 50

## 2012-09-13 MED ORDER — LACTATED RINGERS IV SOLN
INTRAVENOUS | Status: DC
  Administered 2012-09-13: 17:00:00 via INTRAVENOUS

## 2012-09-13 MED ORDER — STERILE WATER FOR IRRIGATION IR SOLN
Status: DC | PRN
  Administered 2012-09-13: 3000 mL

## 2012-09-13 MED ORDER — ONDANSETRON HCL 4 MG PO TABS: 4.0000 mg | ORAL_TABLET | Freq: Four times a day (QID) | ORAL | Status: DC | PRN

## 2012-09-13 MED ORDER — KETOROLAC TROMETHAMINE 30 MG/ML IJ SOLN: 15.0000 mg | Freq: Four times a day (QID) | INTRAMUSCULAR | Status: AC

## 2012-09-13 MED ORDER — OXYCODONE-ACETAMINOPHEN 5-325 MG PO TABS: 1.0000 | ORAL_TABLET | ORAL | Status: DC | PRN

## 2012-09-13 MED ORDER — EPHEDRINE SULFATE 50 MG/ML IJ SOLN
INTRAMUSCULAR | Status: DC | PRN
  Administered 2012-09-13: 10 mg via INTRAVENOUS

## 2012-09-13 SURGICAL SUPPLY — 56 items
APL ESCP 34 STRL LF DISP (HEMOSTASIS) ×2
APL SKNCLS STERI-STRIP NONHPOA (GAUZE/BANDAGES/DRESSINGS) ×2
APPLICATOR SURGIFLO ENDO (HEMOSTASIS) ×1 IMPLANT
BAG SPEC RTRVL LRG 6X4 10 (ENDOMECHANICALS) ×2
BENZOIN TINCTURE PRP APPL 2/3 (GAUZE/BANDAGES/DRESSINGS) ×3 IMPLANT
CHLORAPREP W/TINT 26ML (MISCELLANEOUS) ×4 IMPLANT
CLOTH BEACON ORANGE TIMEOUT ST (SAFETY) ×3 IMPLANT
CORD HIGH FREQUENCY UNIPOLAR (ELECTROSURGICAL) ×3 IMPLANT
CORDS BIPOLAR (ELECTRODE) ×3 IMPLANT
COVER MAYO STAND STRL (DRAPES) ×3 IMPLANT
COVER SURGICAL LIGHT HANDLE (MISCELLANEOUS) ×3 IMPLANT
COVER TIP SHEARS 8 DVNC (MISCELLANEOUS) ×2 IMPLANT
COVER TIP SHEARS 8MM DA VINCI (MISCELLANEOUS) ×1
DECANTER SPIKE VIAL GLASS SM (MISCELLANEOUS) ×2 IMPLANT
DRAPE LG THREE QUARTER DISP (DRAPES) ×6 IMPLANT
DRAPE SURG IRRIG POUCH 19X23 (DRAPES) ×3 IMPLANT
DRAPE TABLE BACK 44X90 PK DISP (DRAPES) ×6 IMPLANT
DRAPE UTILITY XL STRL (DRAPES) ×3 IMPLANT
DRAPE WARM FLUID 44X44 (DRAPE) ×3 IMPLANT
DRSG TEGADERM 6X8 (GAUZE/BANDAGES/DRESSINGS) ×6 IMPLANT
ELECT REM PT RETURN 9FT ADLT (ELECTROSURGICAL) ×3
ELECTRODE REM PT RTRN 9FT ADLT (ELECTROSURGICAL) ×2 IMPLANT
FLOSEAL 10ML (HEMOSTASIS) ×1 IMPLANT
GAUZE VASELINE 3X9 (GAUZE/BANDAGES/DRESSINGS) IMPLANT
GLOVE BIO SURGEON STRL SZ 6.5 (GLOVE) ×12 IMPLANT
GLOVE BIO SURGEON STRL SZ7.5 (GLOVE) ×6 IMPLANT
GLOVE BIOGEL PI IND STRL 7.0 (GLOVE) ×4 IMPLANT
GLOVE BIOGEL PI INDICATOR 7.0 (GLOVE) ×2
GOWN PREVENTION PLUS XLARGE (GOWN DISPOSABLE) ×14 IMPLANT
HOLDER FOLEY CATH W/STRAP (MISCELLANEOUS) ×3 IMPLANT
KIT ACCESSORY DA VINCI DISP (KITS) ×1
KIT ACCESSORY DVNC DISP (KITS) ×2 IMPLANT
MANIPULATOR UTERINE 4.5 ZUMI (MISCELLANEOUS) ×3 IMPLANT
OCCLUDER COLPOPNEUMO (BALLOONS) ×3 IMPLANT
PACK LAPAROSCOPY W LONG (CUSTOM PROCEDURE TRAY) ×3 IMPLANT
POUCH SPECIMEN RETRIEVAL 10MM (ENDOMECHANICALS) ×5 IMPLANT
SET TUBE IRRIG SUCTION NO TIP (IRRIGATION / IRRIGATOR) ×3 IMPLANT
SHEET LAVH (DRAPES) ×3 IMPLANT
SOLUTION ELECTROLUBE (MISCELLANEOUS) ×3 IMPLANT
SPONGE LAP 18X18 X RAY DECT (DISPOSABLE) IMPLANT
STRIP CLOSURE SKIN 1/2X4 (GAUZE/BANDAGES/DRESSINGS) ×3 IMPLANT
SUT VIC AB 0 CT1 27 (SUTURE) ×3
SUT VIC AB 0 CT1 27XBRD ANTBC (SUTURE) ×6 IMPLANT
SUT VIC AB 4-0 PS2 27 (SUTURE) ×6 IMPLANT
SUT VICRYL 0 UR6 27IN ABS (SUTURE) ×5 IMPLANT
SYR 50ML LL SCALE MARK (SYRINGE) ×3 IMPLANT
SYR BULB IRRIGATION 50ML (SYRINGE) IMPLANT
TOWEL OR 17X26 10 PK STRL BLUE (TOWEL DISPOSABLE) ×3 IMPLANT
TOWEL OR NON WOVEN STRL DISP B (DISPOSABLE) ×3 IMPLANT
TRAP SPECIMEN MUCOUS 40CC (MISCELLANEOUS) ×2 IMPLANT
TRAY FOLEY CATH 14FRSI W/METER (CATHETERS) ×3 IMPLANT
TROCAR 12M 150ML BLUNT (TROCAR) ×3 IMPLANT
TROCAR BLADELESS OPT 5 75 (ENDOMECHANICALS) ×3 IMPLANT
TROCAR XCEL 12X100 BLDLESS (ENDOMECHANICALS) ×3 IMPLANT
TUBING FILTER THERMOFLATOR (ELECTROSURGICAL) ×2 IMPLANT
WATER STERILE IRR 1500ML POUR (IV SOLUTION) ×6 IMPLANT

## 2012-09-13 NOTE — Preoperative (Signed)
Beta Blockers   Reason not to administer Beta Blockers:Not Applicable Pt took Beta Blocker 09-13-12 

## 2012-09-13 NOTE — H&P (View-Only) (Signed)
Office Visit Note: Gyn-Onc   Jill Cooke 71 y.o. female  CC:Endometrial cancer grade 2  HPI:  71 y/o G2P2 LNMP at 71 years old.    Patient noted clear vaginal discharge in JN that progressed to bleeding the following month.  Seen by Dr. Kates in 01/2012 and atrophic vaginitis suspected.  Vaginal bleeding continued and she was referred to Dr. OKeefe,  Treated with (?) Megace and antibiotics with out improvement.  Pelvic UTZ 04/2012 c/w 2cm endometrial stripe and retroverted uterus. EMB unsuccesful.  She ws taken to the OR by Dr. OKeefe on  06/24/2012.  D&C hysteroscopy was not feasible because of severe cervical stenosis noted.     INTERVAL HISTORY Patient was administered cytotec vaginally and underwent uterine dilatation and curettage under anesthesia 08/09/2012. Operative findings: Cervix enlarged to 5 cm. No gross lesions, no cul de sac nodularity. Unable to assess adnexae because of the body habitus. Uterus sounded to 10cm. Uterine prolapse to almost the introitus  Pathology  1. Endocervix, curettage - SCANT BENIGN ENDOCERVIX AND BENIGN SQUAMOUS FRAGMENTS. 2. Endometrium, curettage - ADENOCARCINOMA WITH EXTENSIVE SQUAMOUS DIFFERENTIATION. Microscopic Comment There is mucus and fragments of adenocarcinoma with extensive squamous differentiation. There are focal areas with solid growth pattern and the features favor FIGO grade 2.  Current Meds:  Outpatient Encounter Prescriptions as of 08/18/2012  Medication Sig Dispense Refill  . metoprolol tartrate (LOPRESSOR) 25 MG tablet Take 1 tablet (25 mg total) by mouth 2 (two) times daily.  60 tablet  6  . misoprostol (CYTOTEC) 200 MCG tablet Take 200 mcg by mouth once. Insert vaginally only 1 time on 08/08/12        Allergy: No Known Allergies  Social Hx:   History   Social History  . Marital Status: Married    Spouse Name: N/A    Number of Children: N/A  . Years of Education: N/A   Occupational History  . Not on file.   Social History  Main Topics  . Smoking status: Never Smoker   . Smokeless tobacco: Never Used  . Alcohol Use: Yes     ocassional wine or beer   2 x month  . Drug Use: No  . Sexually Active: No   Other Topics Concern  . Not on file   Social History Narrative  . No narrative on file    Past Surgical Hx:  Past Surgical History  Procedure Date  . Dilation and curettage of uterus 08/09/2012    Procedure: DILATATION AND CURETTAGE;  Surgeon: Sheleen Conchas, MD PHD;  Location: [facility];  Service: Gynecology;  Laterality: N/A;    Past Medical Hx:  Past Medical History  Diagnosis Date  . Endometrial hyperplasia   . Postmenopausal bleeding   . Retroversion of uterus   . Vulvar ulceration   . Anxiety     with  diagnosis  . PAF (paroxysmal atrial fibrillation)     a. 06/2012 Admitted with asymp afib->converted on IV dilt.  No asa/coumadin 2/2 h/o vaginal bleeding pending d/c;  b. 07/20/2012 Echo:  EF 60-65%, Gr2 DD, mild LVH   . Vaginal bleeding   Endometrial cancer dx 07/2012  Past Gynecological History: G2P2 Menarche 9 years. Menopause 47 years.  No h/o abnormal pap. No LMP recorded. Patient is postmenopausal.  Family Hx: No family history on file.  Review of Systems:  Constitutional  Feels well,Cardiovascular  No chest pain, shortness of breath, Pulmonary  No cough or wheeze.  Gastro Intestinal  No nausea, vomitting,   or diarrhoea. No bright red blood per rectum, no abdominal pain, change in bowel movement, or constipation.  Genito Urinary  No frequency, urgency, dysuria, vaginal spotting. Musculo Skeletal  No myalgia, arthralgia, joint swelling or pain  Neurologic  No weakness, numbness, change in gait,  Psychology  No depression, anxiety, insomnia.   Vitals:  Blood pressure 158/82, pulse 78, temperature 98.2 F (36.8 C), temperature source Oral, resp. rate 20, height 5' 2.91" (1.598 m), weight 216 lb 14.4 oz (98.385 kg).Body mass index is 38.53 kg/(m^2).   Physical Exam: WD in  NAD Neck  Supple NROM, without any enlargements.  Lymph Node Survey No cervical supraclavicular or inguinal adenopathy Cardiovascular  Pulse normal rate, regularity and rhythm. S1 and S2 normal.  Lungs  Clear to auscultation bilaterally.  Good air movement.  Skin  No rash/lesions/breakdown  Psychiatry  Alert and oriented to person, place, and time  Abdomen  Normoactive bowel sounds, abdomen soft, non-tender and obese. Surgical  sites intact without evidence of hernia.  Back No CVA tenderness Extremities  No bilateral cyanosis, clubbing or edema.   Assessment/Plan:  Jill Cooke  is a 71 y.o.  year old with postmenopausal bleeding and cervical stenosis. Patient underwent uterine curettage under anesthesia 08/09/2012.  Uterus sounded to 10cm , significant uterine descensus appreciated.  Pathology c/w grade 2 endometrial adenocarcinoma.   Surgical staging was recommended. All opened staging versus a minimally invasive approach were presented to the patient.  I extensively reviewed the risks of robotic hysterectomy and possible lymph node dissection, of infection, bleeding, damage to nearby organs including bowel, bladder, vessels, nerves, and ureters. We discussed postoperative risks including infection.  We reviewed possible need for conversion to open laparotomy, need for possible blood transfusion. I discussed positioning during surgery of trendelenberg and risks of minor facial swelling and care we take in preoperative positioning. We also reviewed possible need for further treatment such as radiation or chemotherapy based on final pathology. Expected postoperative recovery was also discussed.   The proposed procedure is a robotic-assisted total laparoscopic hysterectomy bilateral salpingo-oophorectomy bilateral pelvic and possible periaortic lymph node dissection, and other indicated procedures Patient advised to administer cytotec in the vagina the evening prior. The patient is aware  that the surgeon will be Dr. Paola Gehrig. All of her questions and those of her husband were answered to her satisfaction.  Mersadies Petree, MD, PhD 08/18/2012, 10:46 AM   

## 2012-09-13 NOTE — Anesthesia Postprocedure Evaluation (Signed)
Anesthesia Post Note  Patient: Jill Cooke  Procedure(s) Performed: Procedure(s) (LRB): ROBOTIC ASSISTED LAPAROSCOPIC VAGINAL HYSTERECTOMY (N/A) ROBOTIC ASSISTED BILATERAL SALPINGO OOPHERECTOMY (Bilateral) LYMPH NODE DISSECTION (Bilateral)  Anesthesia type: General  Patient location: PACU  Post pain: Pain level controlled  Post assessment: Post-op Vital signs reviewed  Last Vitals: BP 113/60  Pulse 57  Temp 36.6 C (Oral)  Resp 16  SpO2 97%  Post vital signs: Reviewed  Level of consciousness: sedated  Complications: No apparent anesthesia complications

## 2012-09-13 NOTE — Anesthesia Preprocedure Evaluation (Signed)
Anesthesia Evaluation  Patient identified by MRN, date of birth, ID band Patient awake    Reviewed: Allergy & Precautions, H&P , NPO status , Patient's Chart, lab work & pertinent test results, reviewed documented beta blocker date and time   Airway Mallampati: II TM Distance: >3 FB Neck ROM: full    Dental No notable dental hx.    Pulmonary neg pulmonary ROS,  breath sounds clear to auscultation  Pulmonary exam normal       Cardiovascular Exercise Tolerance: Good + dysrhythmias Atrial Fibrillation Rhythm:regular Rate:Normal     Neuro/Psych negative neurological ROS  negative psych ROS   GI/Hepatic negative GI ROS, Neg liver ROS,   Endo/Other  negative endocrine ROS  Renal/GU negative Renal ROS  negative genitourinary   Musculoskeletal   Abdominal   Peds  Hematology negative hematology ROS (+)   Anesthesia Other Findings   Reproductive/Obstetrics negative OB ROS                           Anesthesia Physical  Anesthesia Plan  ASA: III  Anesthesia Plan: General   Post-op Pain Management:    Induction: Intravenous  Airway Management Planned: Oral ETT  Additional Equipment:   Intra-op Plan:   Post-operative Plan:   Informed Consent: I have reviewed the patients History and Physical, chart, labs and discussed the procedure including the risks, benefits and alternatives for the proposed anesthesia with the patient or authorized representative who has indicated his/her understanding and acceptance.   Dental Advisory Given  Plan Discussed with: CRNA and Surgeon  Anesthesia Plan Comments:         Anesthesia Quick Evaluation

## 2012-09-13 NOTE — Transfer of Care (Signed)
Immediate Anesthesia Transfer of Care Note  Patient: Jill Cooke  Procedure(s) Performed: Procedure(s) (LRB): ROBOTIC ASSISTED LAPAROSCOPIC VAGINAL HYSTERECTOMY (N/A) ROBOTIC ASSISTED BILATERAL SALPINGO OOPHERECTOMY (Bilateral) LYMPH NODE DISSECTION (Bilateral)  Patient Location: PACU  Anesthesia Type: General  Level of Consciousness: sedated, patient cooperative and responds to stimulaton  Airway & Oxygen Therapy: Patient Spontanous Breathing and Patient connected to face mask oxgen  Post-op Assessment: Report given to PACU RN and Post -op Vital signs reviewed and stable  Post vital signs: Reviewed and stable  Complications: No apparent anesthesia complications

## 2012-09-13 NOTE — Op Note (Signed)
PATIENT: RANI IDLER DATE OF BIRTH: 1940-12-28 ENCOUNTER DATE: 09/13/12   Preop Diagnosis: Grade 2 endometrioid adenocarcinoma.   Postoperative Diagnosis: same.   Surgery: Total robotic hysterectomy bilateral salpingo-oophorectomy, bilateral pelvic lymph node dissection.   Surgeons:  Bernita Buffy. Duard Brady, MD; Antionette Char, MD   Assistant: Telford Nab   Anesthesia: General   Estimated blood loss: 50 ml   IVF: 1700 ml   Urine output: 150 ml   Complications: None   Pathology: Uterus, cervix, bilateral tubes and ovaries, bilateral pelvic lymph nodes to pathology.   Operative findings: 10 week size uterus, frozen section with grade 2 lesion with less than 50% myometrial invasion.  Significant intra-abdominal obesity and visceral fat.  Procedure: The patient was identified in the preoperative holding area. Informed consent was signed on the chart. Patient was seen history was reviewed and exam was performed.   The patient was then taken to the operating room and placed in the supine position with SCD hose on. General anesthesia was then induced without difficulty. She was then placed in the dorsolithotomy position. Her arms were tucked at her side with appropriate precautions on the gel pad. Shoulder blocks were then placed in the usual fashion with appropriate precautions. A OG-tube was placed to suction. First timeout was performed to confirm the patient procedure antibiotic allergy status, estimated estimated blood loss and OR time. The perineum was then prepped in the usual fashion with Betadine. A 14 French Foley was inserted into the bladder under sterile conditions. A sterile speculum was placed in the vagina. The cervix was without lesions. The cervix was grasped with a single-tooth tenaculum. The dilator without difficulty. A ZUMI with a large Koe ring was placed without difficulty. The abdomen was then prepped with 2 Chlor prep sponges per protocol.  Patient was then draped  after the prep was dried. Second timeout was performed to confirm the above. After again confirming OG tube placement and it was to suction. A stab-wound was made in left upper quadrant 2 cm below the costal margin on the left in the midclavicular line. A 5 mm operative report was used to assure intra-abdominal placement. The abdomen was insufflated. At this point all points during the procedure the patient's intra-abdominal pressure was not increased over 15 mm of mercury. After insufflation was complete, the patient was placed in deep Trendelenburg position. 25 cm above the pubic symphysis that area was marked the camera port. Bilateral robotic ports were marked 10 cm from the midline incision at approximately 5 angle and the fourth arm was marked 2 cm medial and superior to the ACIS. Under direct visualization each of the trochars was placed into the abdomen. The small bowel was folded on its mesentery to allow visualization to the pelvis. The 5 mm LUQ port was then converted to a 10/12 port under direct visualization.  After assuring adequate visualization, the robot was then docked in the usual fashion. Under direct visualization the robotic instruments replaced.   The round ligament on the patient's right side was transected with monopolar cautery. The anterior and posterior leaves of the broad ligament were then taken down in the usual fashion. The ureter was identified on the medial leaf of the broad ligament. The right ovary was tethered to the medial peritoneum and was taken with meticulous dissection. A window was made between the IP and the ureter. The IP was coagulated with bipolar cautery and transected. The posterior leaf of the broad ligament was taken down to the level  of the KOH ring. The bladder flap was created using meticulous dissection and pinpoint cautery. The uterine vessels were coagulated with bipolar cautery. The uterine vessels were then transected and the C loop was created. The same  procedure was performed on the patient's left side.   The pneumo-occulder in the vagina was then insufflated. The colpotomy was then created in the usual fashion. The specimen was then delivered to the vagina and sent for frozen section.   Our attention was then drawn to opening the paravesical space on her left side the perirectal space was also opened. The obturator nerve was identified. The nodal bundle extending over the external iliac artery down to the external iliac vein was taken down using sharp dissection and monopolar cautery.  We continued our dissection down to the level of the obturator nerve. The nodal bundle superior to the obturator nerve was taken. There was significant fatty tissue admixed with the nodes. There was an area of bleeding posterior to the external iliac vein.  A Ray-tec was placed and held in this area.  All pedicles were noted to be hemostatic and the ureter was noted to be well medial of the area of dissection. The nodal bundle was then placed and an Endo catch bag.  The same procedure was performed on the right side.  The Ray-tecs and Endo catch bags were delivered through the vagina.  We were not able to do para-aortic nodes secondary to poor visualization due to her obesity, mesenteric fat and the short mesentery.  The vaginal cuff was closed with a running 0 Vicryl on CT 1 suture. The abdomen and pelvis were copiously irrigated and noted to be hemostatic. Flow Seal was placed in the obturator node basins.. The robotic instruments were removed under direct visualization as were the robotic trochars. The pneumoperitoneum was removed. The patient was then taken out of the Trendelenburg position. Using of 0 Vicryl on a UR 6 needle the midline port port fascia was closed with a figure of eight suture.  The subcutaneous tissue in the left upper quadrant port site was reapproximated. The skin was closed using 4-0 Vicryl. Steri-Strips and benzoin were applied. The pneumo occluded  balloon was removed from the vagina. The vagina was swabbed and noted to be hemostatic.   All instrument needle and Ray-Tec counts were correct x2. The patient tolerated the procedure well and was taken to the recovery room in stable condition. This is Cleda Mccreedy dictating an operative note on patient CECI TALIAFERRO.

## 2012-09-13 NOTE — Interval H&P Note (Signed)
History and Physical Interval Note:  09/13/2012 12:45 PM  Jill Cooke  has presented today for surgery, with the diagnosis of endometrial cancer  The various methods of treatment have been discussed with the patient and family. After consideration of risks, benefits and other options for treatment, the patient has consented to  Procedure(s) (LRB) with comments: ROBOTIC ASSISTED LAPAROSCOPIC VAGINAL HYSTERECTOMY (N/A) ROBOTIC ASSISTED BILATERAL SALPINGO OOPHERECTOMY (N/A) as a surgical intervention .  The patient's history has been reviewed, patient examined, no change in status, stable for surgery.  I have reviewed the patient's chart and labs.  Questions were answered to the patient's satisfaction.  She understands the potential need for laparotomy secondary to need for staging and morbid obesity.   Arlando Leisinger A.

## 2012-09-14 ENCOUNTER — Encounter (HOSPITAL_COMMUNITY): Payer: Self-pay | Admitting: Gynecologic Oncology

## 2012-09-14 LAB — BASIC METABOLIC PANEL
Calcium: 8.3 mg/dL — ABNORMAL LOW (ref 8.4–10.5)
Creatinine, Ser: 0.68 mg/dL (ref 0.50–1.10)
GFR calc Af Amer: >90 mL/min (ref >90)
GFR calc non Af Amer: 85 mL/min — ABNORMAL LOW (ref >90)
Sodium: 133 mEq/L — ABNORMAL LOW (ref 135–145)

## 2012-09-14 LAB — CBC
MCV: 89.4 fL (ref 78.0–100.0)
Platelets: 281 10*3/uL (ref 150–400)
RBC: 4.06 MIL/uL (ref 3.87–5.11)
RDW: 13 % (ref 11.5–15.5)
WBC: 14.9 10*3/uL — ABNORMAL HIGH (ref 4.0–10.5)

## 2012-09-14 MED ORDER — OXYCODONE-ACETAMINOPHEN 5-325 MG PO TABS: 0.5000 | ORAL_TABLET | ORAL | Status: DC | PRN

## 2012-09-14 NOTE — Progress Notes (Signed)
Pt discharged to home  provided discharge instructions and prescriptions along with handouts. Pt verbalized understanding of discharge information. Pt stable. Pt transported by student nurse IV removed and documented. Annitta Needs, RN

## 2012-09-14 NOTE — Discharge Summary (Signed)
Physician Discharge Summary  Patient ID: Jill Cooke MRN: [id number] DOB/AGE: [birth year] 71 y.o.  Admit date: 09/13/2012 Discharge date: 09/14/2012  Admission Diagnoses: Endometrial cancer  Discharge Diagnoses:  Principal Problem:  *Endometrial cancer  Discharged Condition: stable  Hospital Course: On 09/13/2012, the patient underwent the following: Procedure(s): ROBOTIC ASSISTED LAPAROSCOPIC VAGINAL HYSTERECTOMY, ROBOTIC ASSISTED BILATERAL SALPINGO OOPHERECTOMY, LYMPH NODE DISSECTION.  The postoperative course was uneventful.  She was discharged to home on postoperative day 1 tolerating a regular diet.  Consults: None  Significant Diagnostic Studies: None  Treatments: surgery: See above  Discharge Exam: Blood pressure 99/65, pulse 63, temperature 98.1 F (36.7 C), temperature source Oral, resp. rate 18, height 5\' 2"  (1.575 m), weight 216 lb 14.9 oz (98.4 kg), SpO2 96.00%. General appearance: alert, cooperative and no distress Resp: clear to auscultation bilaterally Cardio: regular rate and rhythm, S1, S2 normal, no murmur, click, rub or gallop GI: soft, non-tender; bowel sounds normal; no masses,  no organomegaly Extremities: extremities normal, atraumatic, no cyanosis or edema Incision/Wound: Lap sites to the abdomen with steri strips clean, dry, and intact  Disposition: 01-Home or Self Care      Discharge Orders    Future Orders Please Complete By Expires   Diet - low sodium heart healthy      Increase activity slowly      Driving Restrictions      Comments:   No driving for 2 weeks.  Do not take narcotics and drive.   Lifting restrictions      Comments:   No lifting greater than 10 lbs.   Sexual Activity Restrictions      Comments:   No sexual activity, nothing in the vagina, for 8 weeks.   Call MD for:  temperature >100.4      Call MD for:  persistant nausea and vomiting      Call MD for:  severe uncontrolled pain      Call MD for:  redness, tenderness, or  signs of infection (pain, swelling, redness, odor or green/yellow discharge around incision site)      Call MD for:  difficulty breathing, headache or visual disturbances      Call MD for:  hives      Call MD for:  persistant dizziness or light-headedness      Call MD for:  extreme fatigue          Medication List     As of 09/14/2012  9:57 AM    STOP taking these medications         misoprostol 200 MCG tablet   Commonly known as: CYTOTEC      TAKE these medications         metoprolol tartrate 25 MG tablet   Commonly known as: LOPRESSOR   Take 1 tablet (25 mg total) by mouth 2 (two) times daily.      oxyCODONE-acetaminophen 5-325 MG per tablet   Commonly known as: PERCOCET/ROXICET   Take 0.5-1 tablets by mouth every 4 (four) hours as needed (moderate to severe pain (when tolerating fluids)).        Follow-up Information    Please follow up. (GYN Oncology to call you with final pathology results and to schedule a follow up visit.)         Signed: CROSS, MELISSA DEAL 09/14/2012, 9:57 AM

## 2012-09-14 NOTE — Progress Notes (Signed)
Pt discharged to home. I provided discharge instructions and prescriptions and ask if there were any questions. Pt understood. DC IV and documented. Amy Thacker, SN [facility] . JBM,RN,BSN,[facility]

## 2012-09-19 ENCOUNTER — Telehealth: Payer: Self-pay | Admitting: Gynecologic Oncology

## 2012-09-19 NOTE — Telephone Encounter (Signed)
Post op telephone call to check patient status.  Patient describes expected post operative status.  Adequate PO intake reported.  Bowels and bladder functioning without difficulty.  Pain minimal.  Reportable signs and symptoms reviewed.  Pt informed of final pathology results.  Follow up appt given with Dr. Duard Brady on November 03, 2012.  Instructed to call for any questions or concerns.

## 2012-11-03 ENCOUNTER — Ambulatory Visit: Payer: Medicare Other | Attending: Gynecologic Oncology | Admitting: Gynecologic Oncology

## 2012-11-03 ENCOUNTER — Encounter: Payer: Self-pay | Admitting: Gynecologic Oncology

## 2012-11-03 VITALS — BP 132/68 | HR 74 | Temp 98.5°F | Resp 18 | Ht 62.91 in | Wt 222.0 lb

## 2012-11-03 DIAGNOSIS — C549 Malignant neoplasm of corpus uteri, unspecified: Secondary | ICD-10-CM | POA: Insufficient documentation

## 2012-11-03 DIAGNOSIS — C541 Malignant neoplasm of endometrium: Secondary | ICD-10-CM

## 2012-11-03 NOTE — Patient Instructions (Signed)
RTC 6 months

## 2012-11-03 NOTE — Progress Notes (Signed)
Consult Note: Gyn-Onc  Jill Cooke 71 y.o. female  CC:  Chief Complaint  Patient presents with  . Endo ca    Follow up post-op    HPI: 71 y/o G2P2 LNMP at 71 years old. Patient noted clear vaginal discharge in JN that progressed to bleeding the following month. Seen by Dr. Isabella Stalling in 01/2012 and atrophic vaginitis suspected. Vaginal bleeding continued and she was referred to Dr. Elmore Guise, Treated with (?) Megace and antibiotics with out improvement. Pelvic U/S 04/2012 c/w 2cm endometrial stripe and retroverted uterus. EMB unsuccesful. She was taken to the OR by Dr. Elmore Guise on 06/24/2012. D&C hysteroscopy was not feasible because of severe cervical stenosis noted.   INTERVAL HISTORY Patient was administered cytotec vaginally and underwent uterine dilatation and curettage under anesthesia 08/09/2012. Operative findings: Cervix enlarged to 5 cm. No gross lesions, no cul de sac nodularity. Unable to assess adnexae because of the body habitus. Uterus sounded to 10cm. Uterine prolapse to almost the introitus  Pathology  1. Endocervix, curettage  - SCANT BENIGN ENDOCERVIX AND BENIGN SQUAMOUS FRAGMENTS.  2. Endometrium, curettage  - ADENOCARCINOMA WITH EXTENSIVE SQUAMOUS DIFFERENTIATION.  Microscopic Comment  There is mucus and fragments of adenocarcinoma with extensive squamous differentiation. There are focal areas with solid growth pattern and the features favor FIGO grade 2.  Interval History:  She underwent a total robotic hysterectomy bilateral salpingo-oophorectomy and pelvic lymph node dissection on October 15 of year 2013. Operative findings included to 10 week size uterus. Frozen section revealed a grade 2 lesion with less than 50% myometrial invasion. There was significant intra-abdominal obesity and visceral fat.  Pathology revealed a grade 2 endometrioid adenocarcinoma with 0.9 out of 2.0 cm of myometrial invasion. There was no lymphovascular space involvement. The cervix, uterus otherwise, and  bilateral adnexa were negative. 0/9 lymph nodes were involved. Her final stage was a stage IA grade 2 endometrioid adenocarcinoma. She comes in today for her postoperative visit.  She did very well postoperatively. She only had pain for about 5 days. She denies any bleeding or any significant change in her bowel or bladder habits. She states that week after surgery she developed diffuse rash along the anterior aspect of her abdomen to her back. She did use some Benadryl cream as well as steroid cream for at the rash. About 2 weeks to completely resolved.   Current Meds:  Outpatient Encounter Prescriptions as of 11/03/2012  Medication Sig Dispense Refill  . metoprolol tartrate (LOPRESSOR) 25 MG tablet Take 1 tablet (25 mg total) by mouth 2 (two) times daily.  60 tablet  6  . oxyCODONE-acetaminophen (PERCOCET/ROXICET) 5-325 MG per tablet Take 0.5-1 tablets by mouth every 4 (four) hours as needed (moderate to severe pain (when tolerating fluids)).  10 tablet  0    Allergy: No Known Allergies  Social Hx:   History   Social History  . Marital Status: Married    Spouse Name: N/A    Number of Children: N/A  . Years of Education: N/A   Occupational History  . Not on file.   Social History Main Topics  . Smoking status: Never Smoker   . Smokeless tobacco: Never Used  . Alcohol Use: Yes     Comment: ocassional wine or beer   2 x month  . Drug Use: No  . Sexually Active: No   Other Topics Concern  . Not on file   Social History Narrative  . No narrative on file    Past Surgical  Hx:  Past Surgical History  Procedure Date  . Dilation and curettage of uterus 08/09/2012    Procedure: DILATATION AND CURETTAGE;  Surgeon: Laurette Schimke, MD PHD;  Location: [facility];  Service: Gynecology;  Laterality: N/A;  . Robotic assisted lap vaginal hysterectomy 09/13/2012    Procedure: ROBOTIC ASSISTED LAPAROSCOPIC VAGINAL HYSTERECTOMY;  Surgeon: Rejeana Brock A. Duard Brady, MD;  Location: [facility];  Service:  Gynecology;  Laterality: N/A;  . Lymph node dissection 09/13/2012    Procedure: LYMPH NODE DISSECTION;  Surgeon: Rejeana Brock A. Duard Brady, MD;  Location: [facility];  Service: Gynecology;  Laterality: Bilateral;  bilateral pelvic lymph node dissection    Past Medical Hx:  Past Medical History  Diagnosis Date  . Endometrial hyperplasia   . Postmenopausal bleeding   . Retroversion of uterus   . Vulvar ulceration   . Anxiety     with  diagnosis  . PAF (paroxysmal atrial fibrillation)     a. 06/2012 Admitted with asymp afib->converted on IV dilt.  No asa/coumadin 2/2 h/o vaginal bleeding pending d/c;  b. 07/20/2012 Echo:  EF 60-65%, Gr2 DD, mild LVH   . Vaginal bleeding   . Cancer     ENDOMETRIAL    Family Hx: History reviewed. No pertinent family history.  Vitals:  Blood pressure 132/68, pulse 74, temperature 98.5 F (36.9 C), temperature source Oral, resp. rate 18, height 5' 2.91" (1.598 m), weight 222 lb (100.699 kg).  Physical Exam:  Well-nourished well-developed female in no acute distress.  Abdomen: Well-healed surgical incisions. No appreciable incisional hernias were identified. Wounds are well-healed. Abdomen is soft and nontender.  Pelvic: Normal external female genitalia. The vaginal cuff is intact his well-healed. Bimanual examination reveals no masses or nodularity.  Extremities: No edema  Assessment/Plan: 71 year old with a stage IA grade 2 endometrioid adenocarcinoma who did not meet GOG 99 criteria for vaginal cuff brachytherapy. I discussed with her that she'll need every 6 month followup. We will see her for her first visit in 6 months. She will alert [place] if there are any problems between visits.  Emmie Frakes A., MD 11/03/2012, 10:29 AM

## 2013-03-07 ENCOUNTER — Other Ambulatory Visit (HOSPITAL_COMMUNITY): Payer: Self-pay | Admitting: Nurse Practitioner

## 2013-03-07 NOTE — Telephone Encounter (Signed)
Refill Request    METOPROLOL 25 MG

## 2013-05-11 ENCOUNTER — Ambulatory Visit: Payer: Medicare Other | Admitting: Gynecologic Oncology

## 2013-05-30 ENCOUNTER — Other Ambulatory Visit (HOSPITAL_COMMUNITY): Payer: Self-pay | Admitting: Cardiovascular Disease

## 2013-05-30 NOTE — Telephone Encounter (Signed)
NO REFILLS UNTIL APPOINTMENT Fax Received. Refill Completed. Maylene Crocker Chowoe (R.M.A)   

## 2013-06-14 ENCOUNTER — Encounter: Payer: Self-pay | Admitting: Nurse Practitioner

## 2013-06-14 ENCOUNTER — Ambulatory Visit (INDEPENDENT_AMBULATORY_CARE_PROVIDER_SITE_OTHER): Payer: Medicare Other | Admitting: Nurse Practitioner

## 2013-06-14 VITALS — BP 160/80 | HR 68 | Ht 62.0 in | Wt 228.1 lb

## 2013-06-14 DIAGNOSIS — I4891 Unspecified atrial fibrillation: Secondary | ICD-10-CM

## 2013-06-14 DIAGNOSIS — I480 Paroxysmal atrial fibrillation: Secondary | ICD-10-CM

## 2013-06-14 MED ORDER — DILTIAZEM HCL ER COATED BEADS 180 MG PO CP24: 180.0000 mg | ORAL_CAPSULE | Freq: Every day | ORAL | Status: DC

## 2013-06-14 NOTE — Patient Instructions (Addendum)
Stop the Metoprolol - we will try Diltiazem 180 mg daily  Continue to monitor your blood pressure at home.   Let us know if you start having more spells of rapid/irregular heart beating  We will check an EKG today  We will see you back in 6 months  Call the [facility] office at [phone] if you have any questions, problems or concerns.

## 2013-06-14 NOTE — Progress Notes (Signed)
Steva Ready Date of Birth: [birth year] Medical Record #[id number]  History of Present Illness: Ms. Flanary is seen back today for her follow up visit. This is a 10 month check. Seen for Dr. Elease Hashimoto. She has had PAF back in 2013 follwing a D&C for vaginal bleeding. Converted with IV diltiazem. Echo with normal EF and grade 2 diastolic dysfunction and mild LVH. No other cardiac history. She is obese.   Comes in today. Here alone. Doing well. Has only had one spell of "rapid heart beating". Very short lived. BP at home is better than here. She has gained weight. Wanting to exercise. No chest pain. Not short of breath. Fatigues pretty easily and feels sleepy - feels this is related to her beta blocker and would like to try something in its place. No syncope or dizziness reported.   Current Outpatient Prescriptions  Medication Sig Dispense Refill  . metoprolol tartrate (LOPRESSOR) 25 MG tablet TAKE 1 TABLET BY MOUTH TWICE A DAY  60 tablet  0   No current facility-administered medications for this visit.    No Known Allergies  Past Medical History  Diagnosis Date  . Endometrial hyperplasia   . Postmenopausal bleeding   . Retroversion of uterus   . Vulvar ulceration   . Anxiety     with  diagnosis  . PAF (paroxysmal atrial fibrillation)     a. 06/2012 Admitted with asymp afib->converted on IV dilt.  No asa/coumadin 2/2 h/o vaginal bleeding pending d/c;  b. 07/20/2012 Echo:  EF 60-65%, Gr2 DD, mild LVH   . Vaginal bleeding   . Cancer     ENDOMETRIAL    Past Surgical History  Procedure Laterality Date  . Dilation and curettage of uterus  08/09/2012    Procedure: DILATATION AND CURETTAGE;  Surgeon: Laurette Schimke, MD PHD;  Location: [facility];  Service: Gynecology;  Laterality: N/A;  . Robotic assisted lap vaginal hysterectomy  09/13/2012    Procedure: ROBOTIC ASSISTED LAPAROSCOPIC VAGINAL HYSTERECTOMY;  Surgeon: Rejeana Brock A. Duard Brady, MD;  Location: [facility];  Service: Gynecology;  Laterality: N/A;  .  Lymph node dissection  09/13/2012    Procedure: LYMPH NODE DISSECTION;  Surgeon: Rejeana Brock A. Duard Brady, MD;  Location: [facility];  Service: Gynecology;  Laterality: Bilateral;  bilateral pelvic lymph node dissection    History  Smoking status  . Never Smoker   Smokeless tobacco  . Never Used    History  Alcohol Use  . Yes    Comment: ocassional wine or beer   2 x month    History reviewed. No pertinent family history.  Review of Systems: The review of systems is per the HPI.  All other systems were reviewed and are negative.  Physical Exam: BP 160/80  Pulse 68  Ht 5\' 2"  (1.575 m)  Wt 228 lb 1.9 oz (103.475 kg)  BMI 41.71 kg/m2 Patient is very pleasant and in no acute distress. She is obese. Weight is up 8 pounds. Skin is warm and dry. Color is normal.  HEENT is unremarkable. Normocephalic/atraumatic. PERRL. Sclera are nonicteric. Neck is supple. No masses. No JVD. Lungs are clear. Cardiac exam shows a regular rate and rhythm. Abdomen is soft. Extremities are without edema. Gait and ROM are intact. No gross neurologic deficits noted.  LABORATORY DATA: EKG pending for today.    Lab Results  Component Value Date   WBC 14.9* 09/14/2012   HGB 12.4 09/14/2012   HCT 36.3 09/14/2012   PLT 281 09/14/2012   GLUCOSE  180* 09/14/2012   CHOL 191 07/20/2012   TRIG 142 07/20/2012   HDL 37* 07/20/2012   LDLCALC 126* 07/20/2012   ALT 11 09/09/2012   AST 14 09/09/2012   NA 133* 09/14/2012   K 4.0 09/14/2012   CL 99 09/14/2012   CREATININE 0.68 09/14/2012   BUN 10 09/14/2012   CO2 28 09/14/2012   TSH 1.379 07/19/2012   INR 1.07 07/19/2012     Assessment / Plan: 1. PAF- on low dose beta blocker therapy - in sinus by exam. Check EKG today. Will stop her Lopressor due to fatigue and try Diltiazem 180 mg daily in its place. Tentatively see her back in 6 months.   2. Elevated BP - reports better control at home with readings in the 130's. I have asked her to continue to monitor.   3. Obesity -  we discussed the need for exercise - she has a treadmill at home - will start out at 10 minutes and work towards 45 minutes each day. I think this would help her more than anything.   See her back in 6 months.   Patient is agreeable to this plan and will call if any problems develop in the interim.   Rosalio Macadamia, RN, ANP-C [facility] [address] [place], [place]  [address]

## 2013-06-28 ENCOUNTER — Encounter: Payer: Self-pay | Admitting: Gynecologic Oncology

## 2013-06-28 ENCOUNTER — Ambulatory Visit: Payer: Medicare Other | Attending: Gynecologic Oncology | Admitting: Gynecologic Oncology

## 2013-06-28 VITALS — BP 138/80 | HR 78 | Temp 97.9°F | Resp 18 | Ht 62.91 in | Wt 227.0 lb

## 2013-06-28 DIAGNOSIS — C549 Malignant neoplasm of corpus uteri, unspecified: Secondary | ICD-10-CM | POA: Insufficient documentation

## 2013-06-28 DIAGNOSIS — Z90710 Acquired absence of both cervix and uterus: Secondary | ICD-10-CM | POA: Insufficient documentation

## 2013-06-28 DIAGNOSIS — C541 Malignant neoplasm of endometrium: Secondary | ICD-10-CM

## 2013-06-28 DIAGNOSIS — I4891 Unspecified atrial fibrillation: Secondary | ICD-10-CM | POA: Insufficient documentation

## 2013-06-28 DIAGNOSIS — Z79899 Other long term (current) drug therapy: Secondary | ICD-10-CM | POA: Insufficient documentation

## 2013-06-28 NOTE — Progress Notes (Signed)
Consult Note: Gyn-Onc  Jill Cooke 72 y.o. female  CC:  Chief Complaint  Patient presents with  . Endometrial cancer    Follow up    HPI: 72 y/o G2P2 LNMP at 72 years old. Patient noted clear vaginal discharge that progressed to bleeding the following month. Seen by Dr. Isabella Stalling in 01/2012 and atrophic vaginitis suspected. Vaginal bleeding continued and she was referred to Dr. Elmore Guise, Treated with (?) Megace and antibiotics with out improvement. Pelvic U/S 04/2012 c/w 2cm endometrial stripe and retroverted uterus. EMB unsuccesful. She was taken to the OR by Dr. Elmore Guise on 06/24/2012. D&C hysteroscopy was not feasible because of severe cervical stenosis noted.    Patient was administered cytotec vaginally and underwent uterine dilatation and curettage under anesthesia 08/09/2012. Operative findings: Cervix enlarged to 5 cm. No gross lesions, no cul de sac nodularity. Unable to assess adnexae because of the body habitus. Uterus sounded to 10cm. Uterine prolapse to almost the introitus  Pathology  1. Endocervix, curettage  - SCANT BENIGN ENDOCERVIX AND BENIGN SQUAMOUS FRAGMENTS.  2. Endometrium, curettage  - ADENOCARCINOMA WITH EXTENSIVE SQUAMOUS DIFFERENTIATION.  Microscopic Comment  There is mucus and fragments of adenocarcinoma with extensive squamous differentiation. There are focal areas with solid growth pattern and the features favor FIGO grade 2.  She underwent a total robotic hysterectomy bilateral salpingo-oophorectomy and pelvic lymph node dissection on October 15 of year 2013. Operative findings included to 10 week size uterus. Frozen section revealed a grade 2 lesion with less than 50% myometrial invasion. There was significant intra-abdominal obesity and visceral fat.  Pathology revealed a grade 2 endometrioid adenocarcinoma with 0.9 out of 2.0 cm of myometrial invasion. There was no lymphovascular space involvement. The cervix, uterus otherwise, and bilateral adnexa were negative. 0/9  lymph nodes were involved. Her final stage was a stage IA grade 2 endometrioid adenocarcinoma.   Interval History: She's overall doing quite well she's had a cardiac issues and that she was on metoprolol which made her feel that she was "so motion". It was changed diltiazem and she really feels better. She stay she's lost 2 pounds in the past week due to her increased activity with the medication change. Associated with the changed diltiazem she had mild constipation has been recently relieved with eating more fruits and vegetables. She denies any vaginal bleeding. The more than 3 years since her last mammogram. Her last colonoscopy was negative for any polyps and she was told she needed repeat in 10 years but cannot remember when I last know [place]. She does stay busy in her garden but knows that she needs to get more exercise.   Review of Systems  Constitutional: Feels tired with no energy during the day but this is much better since the change to diltiazem from a metoprolol. Skin: No rash, sores, jaundice, itching, or dryness.  Cardiovascular: No chest pain, shortness of breath, or edema  Pulmonary: No cough or wheeze.  Gastro Intestinal:  No nausea, vomiting, constipation, or diarrhea reported. No bright red blood per rectum or change in bowel movement.  Genitourinary: No frequency, urgency, or dysuria.  Denies vaginal bleeding and discharge. Not sexually active secondary to her husband having prostate cancer surgery Musculoskeletal: No myalgia, arthralgia, joint swelling or pain.  Neurologic: No weakness, numbness, or change in gait.  Psychology: No changes     Current Meds:  Outpatient Encounter Prescriptions as of 06/28/2013  Medication Sig Dispense Refill  . diltiazem (CARDIZEM CD) 180 MG 24 hr capsule Take  1 capsule (180 mg total) by mouth daily.  30 capsule  11   No facility-administered encounter medications on file as of 06/28/2013.    Allergy: No Known Allergies  Social Hx:    History   Social History  . Marital Status: Married    Spouse Name: N/A    Number of Children: N/A  . Years of Education: N/A   Occupational History  . Not on file.   Social History Main Topics  . Smoking status: Never Smoker   . Smokeless tobacco: Never Used  . Alcohol Use: Yes     Comment: ocassional wine or beer   2 x month  . Drug Use: No  . Sexually Active: No   Other Topics Concern  . Not on file   Social History Narrative  . No narrative on file    Past Surgical Hx:  Past Surgical History  Procedure Laterality Date  . Dilation and curettage of uterus  08/09/2012    Procedure: DILATATION AND CURETTAGE;  Surgeon: Laurette Schimke, MD PHD;  Location: [facility];  Service: Gynecology;  Laterality: N/A;  . Robotic assisted lap vaginal hysterectomy  09/13/2012    Procedure: ROBOTIC ASSISTED LAPAROSCOPIC VAGINAL HYSTERECTOMY;  Surgeon: Rejeana Brock A. Duard Brady, MD;  Location: [facility];  Service: Gynecology;  Laterality: N/A;  . Lymph node dissection  09/13/2012    Procedure: LYMPH NODE DISSECTION;  Surgeon: Rejeana Brock A. Duard Brady, MD;  Location: [facility];  Service: Gynecology;  Laterality: Bilateral;  bilateral pelvic lymph node dissection    Past Medical Hx:  Past Medical History  Diagnosis Date  . Endometrial hyperplasia   . Postmenopausal bleeding   . Retroversion of uterus   . Vulvar ulceration   . Anxiety     with  diagnosis  . PAF (paroxysmal atrial fibrillation)     a. 06/2012 Admitted with asymp afib->converted on IV dilt.  No asa/coumadin 2/2 h/o vaginal bleeding pending d/c;  b. 07/20/2012 Echo:  EF 60-65%, Gr2 DD, mild LVH   . Vaginal bleeding   . Cancer     ENDOMETRIAL    Family Hx: History reviewed. No pertinent family history.  Vitals:  Blood pressure 138/80, pulse 78, temperature 97.9 F (36.6 C), temperature source Oral, resp. rate 18, height 5' 2.91" (1.598 m), weight 227 lb (102.967 kg).  Physical Exam:  Well-nourished well-developed female in no acute  distress.  Abdomen: Well-healed surgical incisions. No appreciable incisional hernias were identified. Wounds are well-healed. Abdomen is soft and nontender.  Pelvic: Normal external female genitalia. The vaginal cuff is intact his well-healed. Bimanual examination reveals no masses or nodularity.  Extremities: No edema  Assessment/Plan: 72 year old with a stage IA grade 2 endometrioid adenocarcinoma who did not meet GOG 99 criteria for vaginal cuff brachytherapy. I discussed with her that she'll need every 6 month followup. We will see her for her next visit in 6 months. She will alert [place] if there are any problems between visits.  Rosalva Neary A., MD 06/28/2013, 1:10 PM

## 2013-08-08 ENCOUNTER — Other Ambulatory Visit: Payer: Self-pay

## 2013-08-08 MED ORDER — DILTIAZEM HCL ER COATED BEADS 180 MG PO CP24: 180.0000 mg | ORAL_CAPSULE | Freq: Every day | ORAL | Status: DC

## 2013-08-21 IMAGING — CR DG CHEST 2V
2 series · 2 of 2 positions shown · non-contrast
Comparison: None.

CLINICAL DATA: Preoperative evaluation.  History of endometrial
carcinoma.

CHEST - 2 VIEW

[w chest pa]
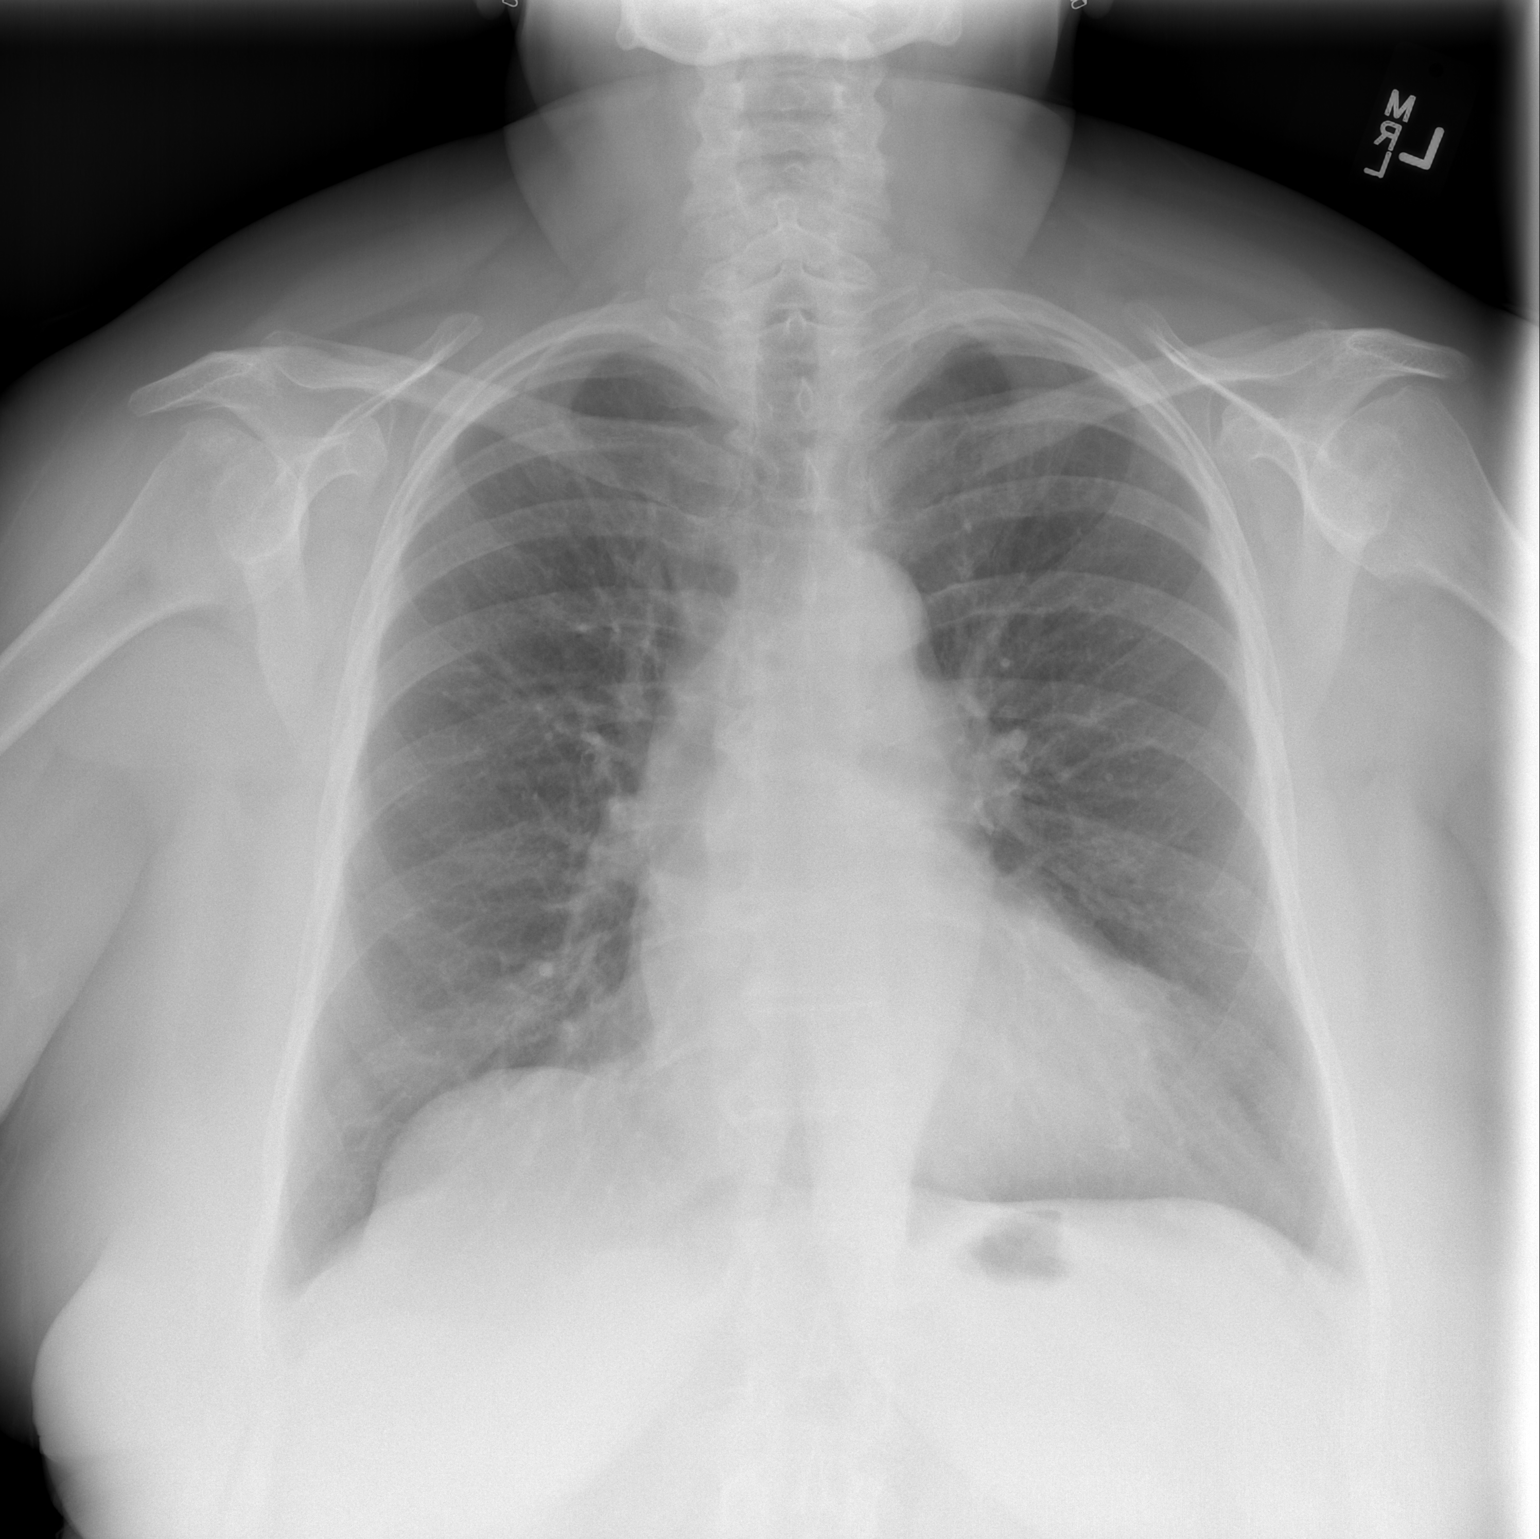

[w chest lat]
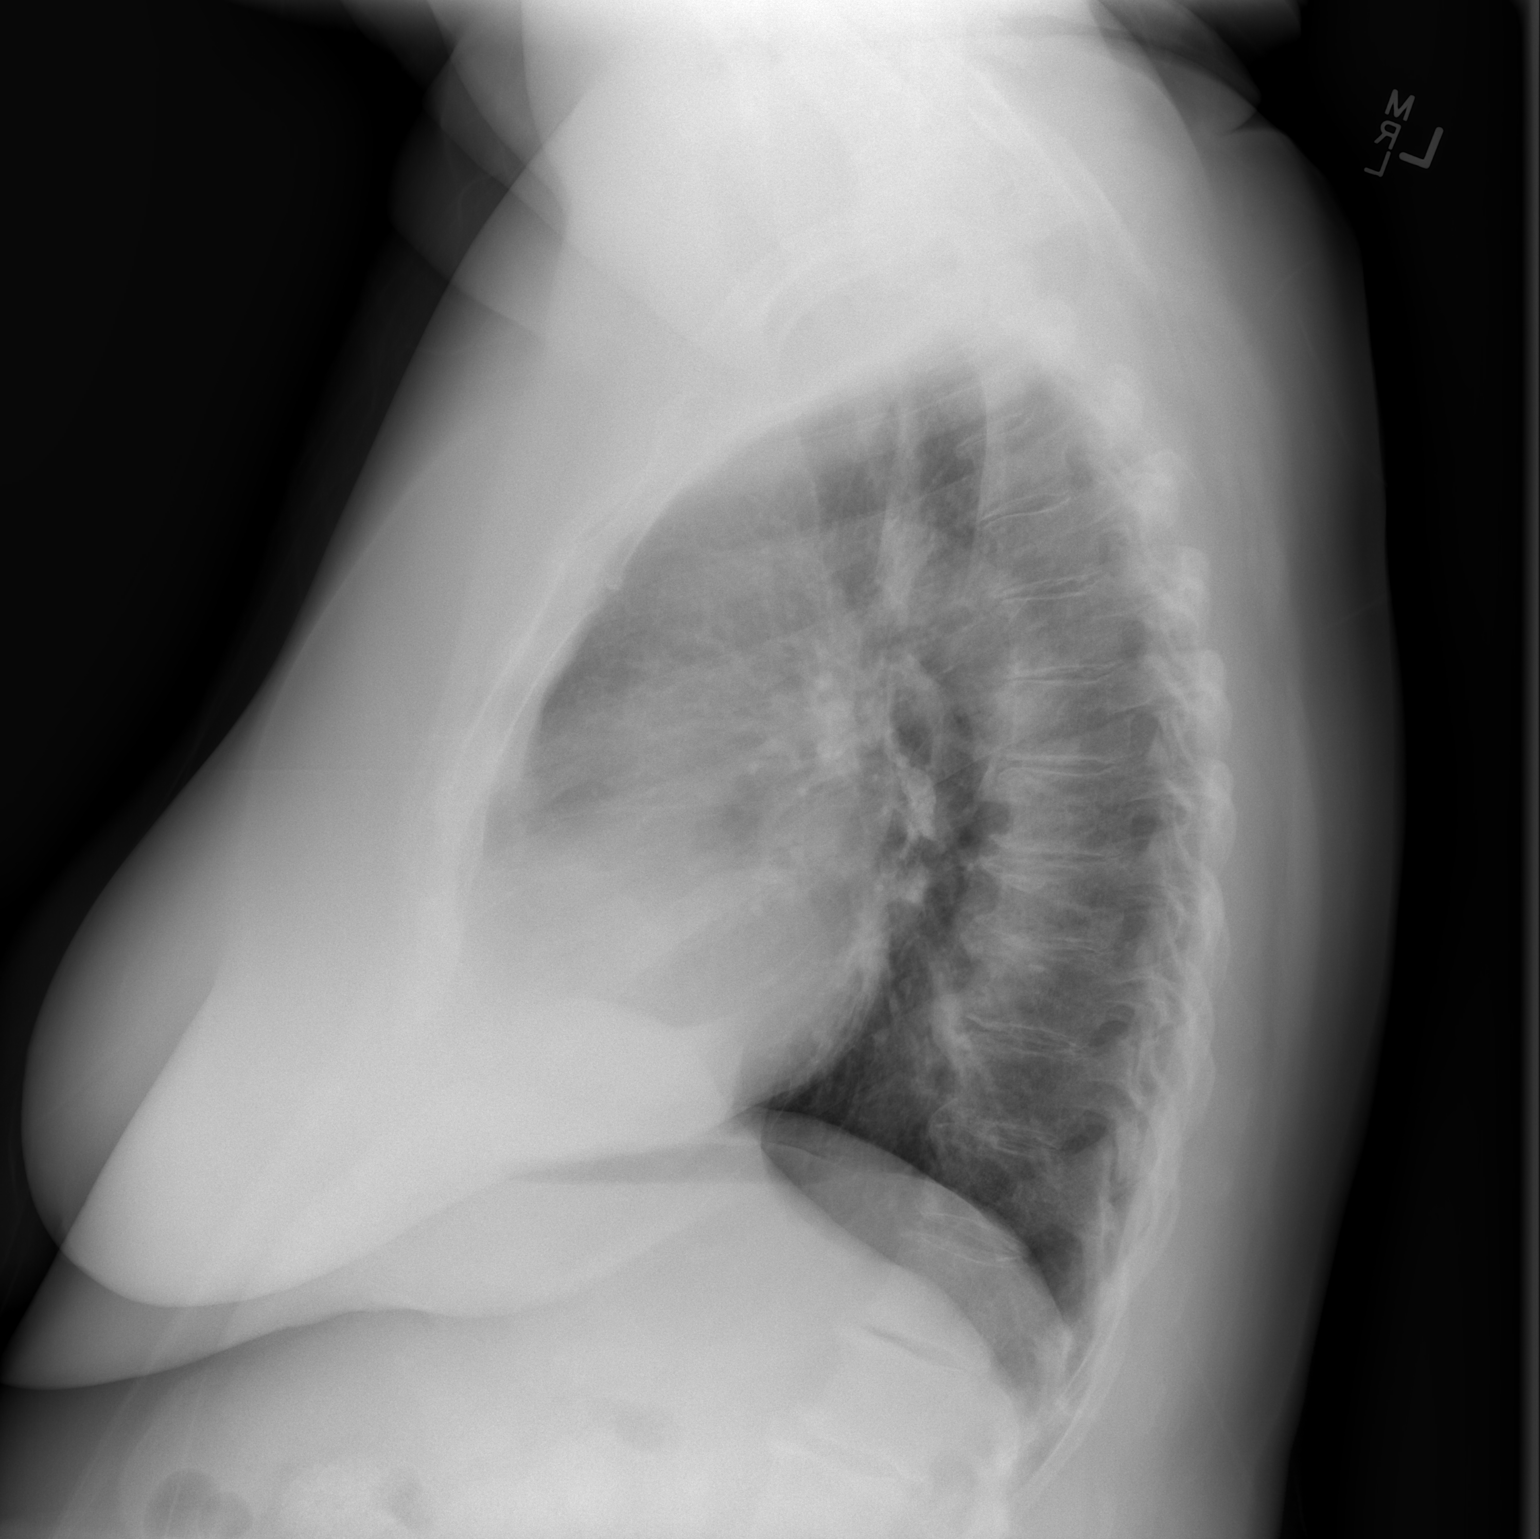

[2 of 2 positions shown; findings below may reference images not displayed]

FINDINGS: There is slight enlargement cardiac silhouette.  Ectasia
and tortuosity of thoracic aorta are present. Slight prominence of
left hilum on PA image most likely vascular.  No definite hilar
enlargement is seen on lateral image.  No pulmonary masses or
nodules or infiltrates are evident. No pleural abnormality is
evident.  There is osteopenic appearance of bones.  Changes of
degenerative disc disease and degenerative spondylosis are present.
IMPRESSION: Slight cardiac silhouette enlargement. No pulmonary edema or
pneumonia.  Chronic findings are detailed above.

## 2014-04-26 DIAGNOSIS — I4891 Unspecified atrial fibrillation: Secondary | ICD-10-CM | POA: Insufficient documentation

## 2014-04-26 DIAGNOSIS — I10 Essential (primary) hypertension: Secondary | ICD-10-CM | POA: Insufficient documentation

## 2014-10-08 ENCOUNTER — Encounter: Payer: Self-pay | Admitting: Nurse Practitioner

## 2014-10-08 ENCOUNTER — Ambulatory Visit (INDEPENDENT_AMBULATORY_CARE_PROVIDER_SITE_OTHER): Payer: Medicare Other | Admitting: Nurse Practitioner

## 2014-10-08 VITALS — BP 120/78 | HR 82 | Ht 62.0 in | Wt 203.8 lb

## 2014-10-08 DIAGNOSIS — I480 Paroxysmal atrial fibrillation: Secondary | ICD-10-CM

## 2014-10-08 MED ORDER — DILTIAZEM HCL ER COATED BEADS 180 MG PO CP24: 180.0000 mg | ORAL_CAPSULE | Freq: Every day | ORAL | Status: DC

## 2014-10-08 NOTE — Patient Instructions (Addendum)
Stay on your current medicines -  I have refilled the Diltiazem today  See me in one year  Call the [place] office at [phone] if you have any questions, problems or concerns.

## 2014-10-08 NOTE — Progress Notes (Signed)
Jill Cooke Date of Birth: Mar 02, 1941 Medical Record #[id number]  History of Present Illness: Jill Cooke is seen back today for her follow up visit. This is a 16 month check. Seen for Jill Cooke. She has had PAF back in 2013 follwing a D&C for vaginal bleeding. Converted with IV diltiazem. Echo with normal EF and grade 2 diastolic dysfunction and mild LVH at that time. No other cardiac history. She is obese.   I last saw her in July of 2014 - was doing ok. Remained limited by weight gain/obesity.   Comes in today. Here alone. Doing well. Has been actively losing weight. No chest pain. Not short of breath. Rhythm has been good. Needs her diltiazem refilled today. Seeing a new PCP - Jill Cooke with [facility] - no longer seeing Jill Cooke.   Current Outpatient Prescriptions  Medication Sig Dispense Refill  . Calcium Carbonate-Vitamin D 600-400 MG-UNIT per tablet Take 1 tablet by mouth daily.     Marland Kitchen diltiazem (CARDIZEM CD) 180 MG 24 hr capsule Take 1 capsule (180 mg total) by mouth daily. 90 capsule 4  . metFORMIN (GLUCOPHAGE-XR) 500 MG 24 hr tablet Take 500 mg by mouth daily.    . Multiple Vitamin (MULTIVITAMIN) capsule Take 1 capsule by mouth daily.    . Omega-3 Fatty Acids (FISH OIL) 1000 MG CAPS Take 1 capsule by mouth daily.    Marland Kitchen spironolactone (ALDACTONE) 25 MG tablet Take 25 mg by mouth daily.     No current facility-administered medications for this visit.    No Known Allergies  Past Medical History  Diagnosis Date  . Endometrial hyperplasia   . Postmenopausal bleeding   . Retroversion of uterus   . Vulvar ulceration   . Anxiety     with  diagnosis  . PAF (paroxysmal atrial fibrillation)     a. 06/2012 Admitted with asymp afib->converted on IV dilt.  No asa/coumadin 2/2 h/o vaginal bleeding pending d/c;  b. 07/20/2012 Echo:  EF 60-65%, Gr2 DD, mild LVH   . Vaginal bleeding   . Cancer     ENDOMETRIAL    Past Surgical History  Procedure Laterality Date  . Dilation  and curettage of uterus  08/09/2012    Procedure: DILATATION AND CURETTAGE;  Surgeon: Jill Morning, MD PHD;  Location: [facility];  Service: Gynecology;  Laterality: N/A;  . Robotic assisted lap vaginal hysterectomy  09/13/2012    Procedure: ROBOTIC ASSISTED LAPAROSCOPIC VAGINAL HYSTERECTOMY;  Surgeon: Jill Gurney A. Alycia Rossetti, MD;  Location: [facility];  Service: Gynecology;  Laterality: N/A;  . Lymph node dissection  09/13/2012    Procedure: LYMPH NODE DISSECTION;  Surgeon: Jill Gurney A. Alycia Rossetti, MD;  Location: [facility];  Service: Gynecology;  Laterality: Bilateral;  bilateral pelvic lymph node dissection    History  Smoking status  . Never Smoker   Smokeless tobacco  . Never Used    History  Alcohol Use  . Yes    Comment: ocassional wine or beer   2 x month    History reviewed. No pertinent family history.  Review of Systems: The review of systems is per the HPI.  All other systems were reviewed and are negative.  Physical Exam: BP 120/78 mmHg  Pulse 82  Ht 5\' 2"  (1.575 m)  Wt 203 lb 12.8 oz (92.443 kg)  BMI 37.27 kg/m2  SpO2 97% Patient is very pleasant and in no acute distress. She has lost weight - down 24 pounds since last visit here. Skin is warm  and dry. Color is normal.  HEENT is unremarkable. Normocephalic/atraumatic. PERRL. Sclera are nonicteric. Neck is supple. No masses. No JVD. Lungs are clear. Cardiac exam shows a regular rate and rhythm. Abdomen is soft. Extremities are without edema. Gait and ROM are intact. No gross neurologic deficits noted.  Wt Readings from Last 3 Encounters:  10/08/14 203 lb 12.8 oz (92.443 kg)  06/28/13 227 lb (102.967 kg)  06/14/13 228 lb 1.9 oz (103.475 kg)    LABORATORY DATA/PROCEDURES: EKG today with sinus rhythm - poor R wave progression.   Lab Results  Component Value Date   WBC 14.9* 09/14/2012   HGB 12.4 09/14/2012   HCT 36.3 09/14/2012   PLT 281 09/14/2012   GLUCOSE 180* 09/14/2012   CHOL 191 07/20/2012   TRIG 142 07/20/2012   HDL 37*  07/20/2012   LDLCALC 126* 07/20/2012   ALT 11 09/09/2012   AST 14 09/09/2012   NA 133* 09/14/2012   K 4.0 09/14/2012   CL 99 09/14/2012   CREATININE 0.68 09/14/2012   BUN 10 09/14/2012   CO2 28 09/14/2012   TSH 1.379 07/19/2012   INR 1.07 07/19/2012    BNP (last 3 results) No results for input(s): PROBNP in the last 8760 hours.   Assessment / Plan: 1. PAF - in sinus. I have refilled her Diltiazem today. See back in a year with EKG  2. Obesity - has lost weight - encouraged her to continue.   3. DM - seeing new PCP next week with complete labs.   Patient is agreeable to this plan and will call if any problems develop in the interim.   Jill Junes, RN, Jill Cooke [address] [place], [place]  [address] [phone]

## 2014-10-22 DIAGNOSIS — E669 Obesity, unspecified: Secondary | ICD-10-CM | POA: Insufficient documentation

## 2014-10-22 DIAGNOSIS — IMO0002 Reserved for concepts with insufficient information to code with codable children: Secondary | ICD-10-CM | POA: Insufficient documentation

## 2014-10-22 DIAGNOSIS — R600 Localized edema: Secondary | ICD-10-CM | POA: Insufficient documentation

## 2014-10-22 DIAGNOSIS — R7303 Prediabetes: Secondary | ICD-10-CM | POA: Insufficient documentation

## 2014-11-15 ENCOUNTER — Other Ambulatory Visit (HOSPITAL_COMMUNITY)
Admission: RE | Admit: 2014-11-15 | Discharge: 2014-11-15 | Disposition: A | Discharge status: Home / Self Care | Payer: Medicare Other | Source: Ambulatory Visit | Attending: Gynecologic Oncology | Admitting: Gynecologic Oncology

## 2014-11-15 ENCOUNTER — Encounter: Payer: Self-pay | Admitting: Gynecologic Oncology

## 2014-11-15 ENCOUNTER — Ambulatory Visit: Payer: Medicare Other | Attending: Gynecologic Oncology | Admitting: Gynecologic Oncology

## 2014-11-15 VITALS — BP 142/57 | HR 82 | Temp 97.8°F | Resp 18 | Ht 62.91 in | Wt 202.4 lb

## 2014-11-15 DIAGNOSIS — C541 Malignant neoplasm of endometrium: Secondary | ICD-10-CM | POA: Insufficient documentation

## 2014-11-15 DIAGNOSIS — Z8542 Personal history of malignant neoplasm of other parts of uterus: Secondary | ICD-10-CM

## 2014-11-15 DIAGNOSIS — Z01411 Encounter for gynecological examination (general) (routine) with abnormal findings: Secondary | ICD-10-CM | POA: Insufficient documentation

## 2014-11-15 NOTE — Patient Instructions (Signed)
We will notify you of the results of your Pap smear. Please return to see Dr. Alycia Rossetti in 6 months.

## 2014-11-15 NOTE — Progress Notes (Signed)
Consult Note: Gyn-Onc  Jill Cooke 73 y.o. female  CC:  Chief Complaint  Patient presents with  . Endometrial Cancer    Follow up    HPI: 73 y/o [place] at 73 years old. Patient noted clear vaginal discharge that progressed to bleeding the following month. Seen by Dr. Clearence Cheek in 01/2012 and atrophic vaginitis suspected. Vaginal bleeding continued and she was referred to Dr. [name], Treated with (?) Megace and antibiotics with out improvement. Pelvic U/S 04/2012 c/w 2cm endometrial stripe and retroverted uterus. EMB unsuccesful. She was taken to the OR by Dr. [name] on 06/24/2012. D&C hysteroscopy was not feasible because of severe cervical stenosis noted.    Patient was administered cytotec vaginally and underwent uterine dilatation and curettage under  anesthesia 08/09/2012. Operative findings: Cervix enlarged to 5 cm. No gross lesions, no cul de sac nodularity. Unable to assess adnexae because of the body habitus. Uterus sounded to 10cm. Uterine prolapse to almost the introitus  Pathology  1. Endocervix, curettage  - SCANT BENIGN ENDOCERVIX AND BENIGN SQUAMOUS FRAGMENTS.  2. Endometrium, curettage  - ADENOCARCINOMA WITH EXTENSIVE SQUAMOUS DIFFERENTIATION.  Microscopic Comment  There is mucus and fragments of adenocarcinoma with extensive squamous differentiation. There are focal areas with solid growth pattern and the features favor FIGO grade 2.  She underwent a total robotic hysterectomy bilateral salpingo-oophorectomy and pelvic lymph node dissection on October 15 of year 2013. Operative findings included to 10 week size uterus. Frozen section revealed a grade 2 lesion with less than 50% myometrial invasion. There was significant intra-abdominal obesity and visceral fat.  Pathology revealed a grade 2 endometrioid adenocarcinoma with 0.9 out of 2.0 cm of myometrial invasion. There was no lymphovascular space involvement. The cervix, uterus otherwise, and bilateral adnexa were negative. 0/9  lymph nodes were involved. Her final stage was a stage IA grade 2 endometrioid adenocarcinoma.   Interval History: I last saw her in July 2014. She has not had any GYN care since that time. She has a new primary care physician, Dr. [name]. Dr. Prince Rome discussed with her the elevation in her A1c and she has subsequently lost 25 pounds primarily with a modifications and changes in her diet. She is currently off of her metformin and is feeling happy about that. She has not had a mammogram done in many years and will follow-up with Dr. Prince Rome on that. There are no new medical problems and her family. Review of systems is as below.   Review of Systems  Constitutional: 25# intentional weight loss Skin: No rash Cardiovascular: No chest pain, shortness of breath, or edema  Pulmonary: No cough or wheeze.  Gastro Intestinal:  No nausea, vomiting, constipation, or diarrhea reported. No bright red blood per rectum or change in bowel movement.  Genitourinary: No frequency, urgency, or dysuria.  Denies vaginal bleeding and discharge.  Musculoskeletal: No myalgia Neurologic: No weakness, numbness, or change in gait.  Psychology: No changes   Current Meds:  Outpatient Encounter Prescriptions as of 11/15/2014  Medication Sig  . Calcium Carbonate-Vitamin D 600-400 MG-UNIT per tablet Take 1 tablet by mouth daily.   Marland Kitchen diltiazem (CARDIZEM CD) 180 MG 24 hr capsule Take 1 capsule (180 mg total) by mouth daily.  . Multiple Vitamin (MULTIVITAMIN) capsule Take 1 capsule by mouth daily.  . Omega-3 Fatty Acids (FISH OIL) 1000 MG CAPS Take 1 capsule by mouth daily.  Marland Kitchen spironolactone (ALDACTONE) 25 MG tablet Take 25 mg by mouth daily.  . [DISCONTINUED] metFORMIN (GLUCOPHAGE-XR) 500  MG 24 hr tablet Take 500 mg by mouth daily.    Allergy: No Known Allergies  Social Hx:   History   Social History  . Marital Status: Married    Spouse Name: N/A    Number of Children: N/A  . Years of Education: N/A    Occupational History  . Not on file.   Social History Main Topics  . Smoking status: Never Smoker   . Smokeless tobacco: Never Used  . Alcohol Use: Yes     Comment: ocassional wine or beer   2 x month  . Drug Use: No  . Sexual Activity: No   Other Topics Concern  . Not on file   Social History Narrative    Past Surgical Hx:  Past Surgical History  Procedure Laterality Date  . Dilation and curettage of uterus  08/09/2012    Procedure: DILATATION AND CURETTAGE;  Surgeon: Janie Morning, MD PHD;  Location: [facility];  Service: Gynecology;  Laterality: N/A;  . Robotic assisted lap vaginal hysterectomy  09/13/2012    Procedure: ROBOTIC ASSISTED LAPAROSCOPIC VAGINAL HYSTERECTOMY;  Surgeon: Imagene Gurney A. Alycia Rossetti, MD;  Location: [facility];  Service: Gynecology;  Laterality: N/A;  . Lymph node dissection  09/13/2012    Procedure: LYMPH NODE DISSECTION;  Surgeon: Imagene Gurney A. Alycia Rossetti, MD;  Location: [facility];  Service: Gynecology;  Laterality: Bilateral;  bilateral pelvic lymph node dissection    Past Medical Hx:  Past Medical History  Diagnosis Date  . Endometrial hyperplasia   . Postmenopausal bleeding   . Retroversion of uterus   . Vulvar ulceration   . Anxiety     with  diagnosis  . PAF (paroxysmal atrial fibrillation)     a. 06/2012 Admitted with asymp afib->converted on IV dilt.  No asa/coumadin 2/2 h/o vaginal bleeding pending d/c;  b. 07/20/2012 Echo:  EF 60-65%, Gr2 DD, mild LVH   . Vaginal bleeding   . Cancer     ENDOMETRIAL    Family Hx: History reviewed. No pertinent family history.  Vitals:  Blood pressure 142/57, pulse 82, temperature 97.8 F (36.6 C), resp. rate 18, height 5' 2.91" (1.598 m), weight 202 lb 6.4 oz (91.808 kg).  Physical Exam:  Well-nourished well-developed female in no acute distress.  Neck: Supple, no lymphadenopathy, no thyromegaly.  Lungs: Clear to auscultation bilateral.  Cardiovascular: Regular rate and rhythm  Abdomen: Well-healed surgical  incisions. No appreciable incisional hernias were identified.  Abdomen is soft and nontender. No palpable masses, no hepatosplenomegaly.  Pelvic: Normal external female genitalia. The vaginal cuff has no visible lesions. Pap smear was submitted without difficulty. Bimanual examination reveals no masses or nodularity. Rectal confirms  Extremities: No edema  Assessment/Plan: 73 year old with a stage IA grade 2 endometrioid adenocarcinoma who did not meet GOG 99 criteria for vaginal cuff brachytherapy. We have not seen her since July 2014. She has no evidence of recurrent disease. We will follow-up in results for Pap smear from today. She return to see [place] in 6 months. She will take the responsibility of getting her mammogram scheduled. In the interim she'll follow-up with Dr. Prince Rome as scheduled. She was congratulated on her weight loss. Hedwig Mcfall A., MD 11/15/2014, 1:30 PM

## 2014-11-19 LAB — CYTOLOGY - PAP

## 2014-11-20 ENCOUNTER — Telehealth: Payer: Self-pay | Admitting: *Deleted

## 2014-11-20 NOTE — Telephone Encounter (Signed)
Called pt to give her pap smear results as noted below. No answer on cell or home phone and unable to leave a message on either. Will attempt again later.

## 2014-11-20 NOTE — Telephone Encounter (Signed)
-----   Message from Dorothyann Gibbs, NP sent at 11/19/2014  4:12 PM EST ----- Please let her know pap is normal ----- Message -----    From: [facility] Interface    Sent: 11/19/2014   2:40 PM      To: Dorothyann Gibbs, NP

## 2014-11-20 NOTE — Telephone Encounter (Signed)
Patient called back. Gave her results and told patient to please call us with any further concerns or issues. Patient agreeable to this.

## 2014-12-13 ENCOUNTER — Encounter (HOSPITAL_COMMUNITY): Payer: Self-pay | Admitting: Gynecology

## 2015-04-30 ENCOUNTER — Ambulatory Visit: Payer: PPO | Attending: Gynecologic Oncology | Admitting: Gynecologic Oncology

## 2015-04-30 ENCOUNTER — Encounter: Payer: Self-pay | Admitting: Gynecologic Oncology

## 2015-04-30 VITALS — BP 144/56 | HR 89 | Temp 98.3°F | Resp 18 | Ht 62.91 in | Wt 208.6 lb

## 2015-04-30 DIAGNOSIS — Z90710 Acquired absence of both cervix and uterus: Secondary | ICD-10-CM | POA: Diagnosis not present

## 2015-04-30 DIAGNOSIS — Z90722 Acquired absence of ovaries, bilateral: Secondary | ICD-10-CM | POA: Insufficient documentation

## 2015-04-30 DIAGNOSIS — Z08 Encounter for follow-up examination after completed treatment for malignant neoplasm: Secondary | ICD-10-CM | POA: Insufficient documentation

## 2015-04-30 DIAGNOSIS — Z8542 Personal history of malignant neoplasm of other parts of uterus: Secondary | ICD-10-CM | POA: Insufficient documentation

## 2015-04-30 DIAGNOSIS — C541 Malignant neoplasm of endometrium: Secondary | ICD-10-CM

## 2015-04-30 NOTE — Patient Instructions (Signed)
Follow up with Dr. Adir Schicker in 6 months. 

## 2015-04-30 NOTE — Progress Notes (Signed)
Consult Note: Gyn-Onc  Jill Cooke 74 y.o. female  CC:  Chief Complaint  Patient presents with  . endometrial cancer    HPI: 74 y/o [place] at 74 years old. Patient noted clear vaginal discharge that progressed to bleeding the following month. Seen by Dr. Clearence Cheek in 01/2012 and atrophic vaginitis suspected. Vaginal bleeding continued and she was referred to Dr. [name], Treated with (?) Megace and antibiotics with out improvement. Pelvic U/S 04/2012 c/w 2cm endometrial stripe and retroverted uterus. EMB unsuccesful. She was taken to the OR by Dr. [name] on 06/24/2012. D&C hysteroscopy was not feasible because of severe cervical stenosis noted.    Patient was administered cytotec vaginally and underwent uterine dilatation and curettage under  anesthesia 08/09/2012. Operative findings: Cervix enlarged to 5 cm. No gross lesions, no cul de sac nodularity. Unable to assess adnexae because of the body habitus. Uterus sounded to 10cm. Uterine prolapse to almost the introitus  Pathology  1. Endocervix, curettage  - SCANT BENIGN ENDOCERVIX AND BENIGN SQUAMOUS FRAGMENTS.  2. Endometrium, curettage  - ADENOCARCINOMA WITH EXTENSIVE SQUAMOUS DIFFERENTIATION.  Microscopic Comment  There is mucus and fragments of adenocarcinoma with extensive squamous differentiation. There are focal areas with solid growth pattern and the features favor FIGO grade 2.  She underwent a total robotic hysterectomy bilateral salpingo-oophorectomy and pelvic lymph node dissection on October 15 of year 2013. Operative findings included to 10 week size uterus. Frozen section revealed a grade 2 lesion with less than 50% myometrial invasion. There was significant intra-abdominal obesity and visceral fat.  Pathology revealed a grade 2 endometrioid adenocarcinoma with 0.9 out of 2.0 cm of myometrial invasion. There was no lymphovascular space involvement. The cervix, uterus otherwise, and bilateral adnexa were negative. 0/9 lymph nodes  were involved. Her final stage was a stage IA grade 2 endometrioid adenocarcinoma.   Interval History: I last saw her in December 2015. At that time her exam and Pap smear were both normal. Her primary care physician is Dr. [name]. She's overall doing quite well. She has gained about 8 pounds since we last saw her but she states this is because she does not like to keep salads in the winter. It was early March in early May she had a slight cough with congestion and fatigue. Her husband had the same symptoms. Her symptoms have resolved now. She may have a cough once a day she never had a mammogram done she knows that she needs to get this scheduled. She is due to see Dr. Prince Rome for her routine care and will have an A1c checked at that time. She really hasn't completely negative review of systems with the exception of she feels stiff if she's been sitting for 2 long. There are no new medical problems and her family.  Review of Systems  Constitutional: Weight slowly increasing Skin: No rash Cardiovascular: No chest pain, shortness of breath, or edema  Pulmonary: No cough or wheeze.  Gastro Intestinal:  No nausea, vomiting, constipation, or diarrhea reported. No bright red blood per rectum or change in bowel movement.  Genitourinary: No frequency, urgency, or dysuria.  Denies vaginal bleeding and discharge.  Musculoskeletal: No myalgia, stiff if sitting too long Neurologic: No weakness, numbness, or change in gait.  Psychology: No changes   Current Meds:  Outpatient Encounter Prescriptions as of 04/30/2015  Medication Sig  . Calcium Carbonate-Vitamin D 600-400 MG-UNIT per tablet Take 1 tablet by mouth daily.   Marland Kitchen diltiazem (CARDIZEM CD) 180 MG 24  hr capsule Take 1 capsule (180 mg total) by mouth daily.  . Multiple Vitamin (MULTIVITAMIN) capsule Take 1 capsule by mouth daily.  . Red Yeast Rice Extract (RED YEAST RICE PO) Take 1 tablet by mouth daily.  Marland Kitchen spironolactone (ALDACTONE) 25 MG  tablet Take 25 mg by mouth daily.  . Omega-3 Fatty Acids (FISH OIL) 1000 MG CAPS Take 1 capsule by mouth daily.   No facility-administered encounter medications on file as of 04/30/2015.    Allergy: No Known Allergies  Social Hx:   History   Social History  . Marital Status: Married    Spouse Name: N/A  . Number of Children: N/A  . Years of Education: N/A   Occupational History  . Not on file.   Social History Main Topics  . Smoking status: Never Smoker   . Smokeless tobacco: Never Used  . Alcohol Use: Yes     Comment: ocassional wine or beer   2 x month  . Drug Use: No  . Sexual Activity: No   Other Topics Concern  . Not on file   Social History Narrative    Past Surgical Hx:  Past Surgical History  Procedure Laterality Date  . Dilation and curettage of uterus  08/09/2012    Procedure: DILATATION AND CURETTAGE;  Surgeon: Janie Morning, MD PHD;  Location: [facility];  Service: Gynecology;  Laterality: N/A;  . Robotic assisted lap vaginal hysterectomy  09/13/2012    Procedure: ROBOTIC ASSISTED LAPAROSCOPIC VAGINAL HYSTERECTOMY;  Surgeon: Imagene Gurney A. Alycia Rossetti, MD;  Location: [facility];  Service: Gynecology;  Laterality: N/A;  . Lymph node dissection  09/13/2012    Procedure: LYMPH NODE DISSECTION;  Surgeon: Imagene Gurney A. Alycia Rossetti, MD;  Location: [facility];  Service: Gynecology;  Laterality: Bilateral;  bilateral pelvic lymph node dissection    Past Medical Hx:  Past Medical History  Diagnosis Date  . Endometrial hyperplasia   . Postmenopausal bleeding   . Retroversion of uterus   . Vulvar ulceration   . Anxiety     with  diagnosis  . PAF (paroxysmal atrial fibrillation)     a. 06/2012 Admitted with asymp afib->converted on IV dilt.  No asa/coumadin 2/2 h/o vaginal bleeding pending d/c;  b. 07/20/2012 Echo:  EF 60-65%, Gr2 DD, mild LVH   . Vaginal bleeding   . Cancer     ENDOMETRIAL    Family Hx: History reviewed. No pertinent family history.  Vitals:  Blood pressure 144/56, pulse  89, temperature 98.3 F (36.8 C), temperature source Oral, resp. rate 18, height 5' 2.91" (1.598 m), weight 208 lb 9.6 oz (94.62 kg).  Physical Exam:  Well-nourished well-developed female in no acute distress.  Neck: Supple, no lymphadenopathy, no thyromegaly.  Lungs: Clear to auscultation bilateral.  Cardiovascular: Regular rate and rhythm  Abdomen: Well-healed surgical incisions. No appreciable incisional hernias were identified.  Abdomen is soft and nontender. No palpable masses, no hepatosplenomegaly. Exam is limited by habitus  Pelvic: Normal external female genitalia. The vaginal cuff has no visible lesions. Bimanual examination reveals no masses or nodularity. Rectal confirms  Extremities: No edema  Assessment/Plan: 74 year old with a stage IA grade 2 endometrioid adenocarcinoma who did not meet GOG 99 criteria for vaginal cuff brachytherapy. She has no evidence of recurrent disease. She return to see [place] in 6 months. She will take the responsibility of getting her mammogram scheduled. In the interim she'll follow-up with Dr. Prince Rome as scheduled.  Bernhardt Riemenschneider A., MD 04/30/2015, 1:55 PM

## 2015-05-02 ENCOUNTER — Ambulatory Visit: Payer: Medicare Other | Admitting: Gynecologic Oncology

## 2015-08-19 ENCOUNTER — Encounter: Payer: Self-pay | Admitting: *Deleted

## 2015-10-09 ENCOUNTER — Ambulatory Visit: Payer: Medicare Other | Admitting: Nurse Practitioner

## 2015-10-15 ENCOUNTER — Encounter: Payer: Self-pay | Admitting: Nurse Practitioner

## 2015-10-15 ENCOUNTER — Ambulatory Visit (INDEPENDENT_AMBULATORY_CARE_PROVIDER_SITE_OTHER): Payer: PPO | Admitting: Nurse Practitioner

## 2015-10-15 VITALS — BP 126/64 | HR 80 | Ht 62.0 in | Wt 210.8 lb

## 2015-10-15 DIAGNOSIS — I480 Paroxysmal atrial fibrillation: Secondary | ICD-10-CM

## 2015-10-15 DIAGNOSIS — E785 Hyperlipidemia, unspecified: Secondary | ICD-10-CM

## 2015-10-15 DIAGNOSIS — R609 Edema, unspecified: Secondary | ICD-10-CM

## 2015-10-15 LAB — HEPATIC FUNCTION PANEL
ALT: 12 U/L (ref 6–29)
AST: 14 U/L (ref 10–35)
Albumin: 4 g/dL (ref 3.6–5.1)
Alkaline Phosphatase: 105 U/L (ref 33–130)
Bilirubin, Direct: 0.1 mg/dL (ref <=0.2)
Indirect Bilirubin: 0.3 mg/dL (ref 0.2–1.2)
Total Bilirubin: 0.4 mg/dL (ref 0.2–1.2)
Total Protein: 7.2 g/dL (ref 6.1–8.1)

## 2015-10-15 LAB — BASIC METABOLIC PANEL
BUN: 25 mg/dL (ref 7–25)
CO2: 28 mmol/L (ref 20–31)
Calcium: 9.7 mg/dL (ref 8.6–10.4)
Chloride: 102 mmol/L (ref 98–110)
Creat: 1.07 mg/dL — ABNORMAL HIGH (ref 0.60–0.93)
Glucose, Bld: 102 mg/dL — ABNORMAL HIGH (ref 65–99)
Potassium: 4.4 mmol/L (ref 3.5–5.3)
Sodium: 138 mmol/L (ref 135–146)

## 2015-10-15 LAB — LIPID PANEL
Cholesterol: 194 mg/dL (ref 125–200)
HDL: 45 mg/dL — ABNORMAL LOW (ref >=46)
LDL Cholesterol: 123 mg/dL (ref <130)
Total CHOL/HDL Ratio: 4.3 Ratio (ref <=5.0)
Triglycerides: 130 mg/dL (ref <150)
VLDL: 26 mg/dL (ref <30)

## 2015-10-15 LAB — CBC
HCT: 39.7 % (ref 36.0–46.0)
Hemoglobin: 13.5 g/dL (ref 12.0–15.0)
MCH: 30.6 pg (ref 26.0–34.0)
MCHC: 34 g/dL (ref 30.0–36.0)
MCV: 90 fL (ref 78.0–100.0)
MPV: 8.5 fL — ABNORMAL LOW (ref 8.6–12.4)
Platelets: 379 10*3/uL (ref 150–400)
RBC: 4.41 MIL/uL (ref 3.87–5.11)
RDW: 13.5 % (ref 11.5–15.5)
WBC: 11.3 10*3/uL — ABNORMAL HIGH (ref 4.0–10.5)

## 2015-10-15 LAB — TSH: TSH: 2.476 u[IU]/mL (ref 0.350–4.500)

## 2015-10-15 MED ORDER — DILTIAZEM HCL ER COATED BEADS 180 MG PO CP24: 180.0000 mg | ORAL_CAPSULE | Freq: Every day | ORAL | Status: DC

## 2015-10-15 NOTE — Patient Instructions (Addendum)
We will be checking the following labs today - BMET, CBC, HPF, lipids and TSH   Medication Instructions:    Continue with your current medicines.   I refilled the Diltiazem today  Start baby aspirin daily - will give samples    Testing/Procedures To Be Arranged:  N/A  Follow-Up:   See me in one year    Other Special Instructions:   N/A    If you need a refill on your cardiac medications before your next appointment, please call your pharmacy.   Call the [place] office at [phone] if you have any questions, problems or concerns.

## 2015-10-15 NOTE — Progress Notes (Signed)
CARDIOLOGY OFFICE NOTE  Date:  10/15/2015    [name] Date of Birth: 1941/01/03 Medical Record [id number]  PCP:  Suzanna Obey, MD  Cardiologist:  Nah[place]    Chief Complaint  Patient presents with  . Atrial Fibrillation    1 year check - seen for Dr. Acie Fredrickson    History of Present Illness: Jill Cooke is a 74 y.o. female who presents today for an annual check. Seen for Dr. Acie Fredrickson. She has had PAF back in 2013 following a D&C for vaginal bleeding. Converted with IV diltiazem. Echo with normal EF and grade 2 diastolic dysfunction and mild LVH at that time. No other cardiac history. She is obese.   I last saw her in November of 2015 and she was doing ok.   Comes in today. Here alone. She has done ok over the past year. No palpitations. Not short of breath. No chest pain. Has gotten slack with her exercise and has gained weight. Not on aspirin - not clear why. Her rhythm has been ok.   Past Medical History  Diagnosis Date  . Endometrial hyperplasia   . Postmenopausal bleeding   . Retroversion of uterus   . Vulvar ulceration   . Anxiety     with  diagnosis  . PAF (paroxysmal atrial fibrillation) ([place])     a. 06/2012 Admitted with asymp afib->converted on IV dilt.  No asa/coumadin 2/2 h/o vaginal bleeding pending d/c;  b. 07/20/2012 Echo:  EF 60-65%, Gr2 DD, mild LVH   . Vaginal bleeding   . Cancer [facility])     ENDOMETRIAL    Past Surgical History  Procedure Laterality Date  . Dilation and curettage of uterus  08/09/2012    Procedure: DILATATION AND CURETTAGE;  Surgeon: Janie Morning, MD PHD;  Location: [facility];  Service: Gynecology;  Laterality: N/A;  . Robotic assisted lap vaginal hysterectomy  09/13/2012    Procedure: ROBOTIC ASSISTED LAPAROSCOPIC VAGINAL HYSTERECTOMY;  Surgeon: Imagene Gurney A. Alycia Rossetti, MD;  Location: [facility];  Service: Gynecology;  Laterality: N/A;  . Lymph node dissection  09/13/2012    Procedure: LYMPH NODE DISSECTION;  Surgeon: Imagene Gurney A. Alycia Rossetti, MD;  Location:  [facility];  Service: Gynecology;  Laterality: Bilateral;  bilateral pelvic lymph node dissection     Medications: Current Outpatient Prescriptions  Medication Sig Dispense Refill  . Calcium Carbonate-Vitamin D 600-400 MG-UNIT per tablet Take 1 tablet by mouth daily.     Marland Kitchen diltiazem (CARDIZEM CD) 180 MG 24 hr capsule Take 1 capsule (180 mg total) by mouth daily. 90 capsule 3  . Multiple Vitamin (MULTIVITAMIN) capsule Take 1 capsule by mouth daily.    . Omega-3 Fatty Acids (FISH OIL) 1000 MG CAPS Take 1 capsule by mouth daily.    . Red Yeast Rice Extract (RED YEAST RICE PO) Take 1 tablet by mouth daily.    . vitamin B-12 (CYANOCOBALAMIN) 100 MCG tablet Take 100 mcg by mouth daily.    Marland Kitchen spironolactone (ALDACTONE) 25 MG tablet Take 25 mg by mouth daily.     No current facility-administered medications for this visit.    Allergies: No Known Allergies  Social History: The patient  reports that she has never smoked. She has never used smokeless tobacco. She reports that she drinks alcohol. She reports that she does not use illicit drugs.   Family History: The patient's family history is not on file. Both parents lived past their 22's.  Review of Systems: Please see the history of  present illness.   Otherwise, the review of systems is positive for none.   All other systems are reviewed and negative.   Physical Exam: VS:  BP 126/64 mmHg  Pulse 80  Ht 5\' 2"  (1.575 m)  Wt 210 lb 12.8 oz (95.618 kg)  BMI 38.55 kg/m2 .  BMI Body mass index is 38.55 kg/(m^2).  Wt Readings from Last 3 Encounters:  10/15/15 210 lb 12.8 oz (95.618 kg)  04/30/15 208 lb 9.6 oz (94.62 kg)  11/15/14 202 lb 6.4 oz (91.808 kg)    General: Pleasant. She remains obese. She has gained weight. She is in no acute distress.  HEENT: Normal. Neck: Supple, no JVD, carotid bruits, or masses noted.  Cardiac: Regular rate and rhythm. No murmurs, rubs, or gallops. No edema.  Respiratory:  Lungs are clear to auscultation  bilaterally with normal work of breathing.  GI: Soft and nontender.  MS: No deformity or atrophy. Gait and ROM intact. Skin: Warm and dry. Color is normal.  Neuro:  Strength and sensation are intact and no gross focal deficits noted.  Psych: Alert, appropriate and with normal affect.   LABORATORY DATA:  EKG:  EKG is ordered today. This demonstrates NSR.  Lab Results  Component Value Date   WBC 14.9* 09/14/2012   HGB 12.4 09/14/2012   HCT 36.3 09/14/2012   PLT 281 09/14/2012   GLUCOSE 180* 09/14/2012   CHOL 191 07/20/2012   TRIG 142 07/20/2012   HDL 37* 07/20/2012   LDLCALC 126* 07/20/2012   ALT 11 09/09/2012   AST 14 09/09/2012   NA 133* 09/14/2012   K 4.0 09/14/2012   CL 99 09/14/2012   CREATININE 0.68 09/14/2012   BUN 10 09/14/2012   CO2 28 09/14/2012   TSH 1.379 07/19/2012   INR 1.07 07/19/2012    BNP (last 3 results) No results for input(s): BNP in the last 8760 hours.  ProBNP (last 3 results) No results for input(s): PROBNP in the last 8760 hours.   Other Studies Reviewed Today:  Echo Study Conclusions from 2013  - Left ventricle: The cavity size was normal. Wall thickness was increased in a pattern of mild LVH. Systolic function was normal. The estimated ejection fraction was in the range of 60% to 65%. Wall motion was normal; there were no regional wall motion abnormalities. Features are consistent with a pseudonormal left ventricular filling pattern, with concomitant abnormal relaxation and increased filling pressure (grade 2 diastolic dysfunction). - Aortic valve: There was no stenosis. - Mitral valve: Trivial regurgitation. - Left atrium: The atrium was mildly dilated. - Right ventricle: The cavity size was normal. Systolic function was normal. - Tricuspid valve: Peak RV-RA gradient: 52mm Hg (S). - Pulmonary arteries: PA peak pressure: 89mm Hg (S). - Inferior vena cava: The vessel was normal in size; the respirophasic  diameter changes were in the normal range (= 50%); findings are consistent with normal central venous pressure. Impressions:  - Normal LV size with mild LV hypertrophy. EF 60-65%. Moderate diastolic dysfunction. Normal RV size and systolic function. No significant valvular dysfunction.  Assessment/Plan: 1. PAF - remains in sinus. I have refilled her Diltiazem today. See back in a year with EKG. I have suggested she take a baby aspirin daily. Will give samples. If she were to have a recurrent episode of AF - she will be committed to long term anticoagulation. Discussed with Dr. Acie Fredrickson who is in agreement with this plan.   2. Obesity - knows she needs to get  back on track with walking/diet/etc. Needs to really make her health a priority.   3. DM - followed by PCP   4. HLD - checking labs today.    Current medicines are reviewed with the patient today.  The patient does not have concerns regarding medicines other than what has been noted above.  The following changes have been made:  See above.  Labs/ tests ordered today include:    Orders Placed This Encounter  Procedures  . Basic metabolic panel  . CBC  . Hepatic function panel  . Lipid panel  . TSH  . EKG 12-Lead     Disposition:   FU with me in 1 year.   Patient is agreeable to this plan and will call if any problems develop in the interim.   Signed: Burtis Junes, RN, ANP-C 10/15/2015 10:41 AM  [facility] [address] [place], [place]  [address] Phone: [phone] Fax: [phone]

## 2015-10-30 ENCOUNTER — Other Ambulatory Visit (HOSPITAL_COMMUNITY)
Admission: RE | Admit: 2015-10-30 | Discharge: 2015-10-30 | Disposition: A | Discharge status: Home / Self Care | Payer: PPO | Source: Ambulatory Visit | Attending: Gynecologic Oncology | Admitting: Gynecologic Oncology

## 2015-10-30 ENCOUNTER — Encounter: Payer: Self-pay | Admitting: Gynecologic Oncology

## 2015-10-30 ENCOUNTER — Ambulatory Visit: Payer: PPO | Attending: Gynecologic Oncology | Admitting: Gynecologic Oncology

## 2015-10-30 VITALS — BP 139/63 | HR 85 | Temp 98.0°F | Resp 18 | Ht 62.0 in | Wt 211.6 lb

## 2015-10-30 DIAGNOSIS — Z01411 Encounter for gynecological examination (general) (routine) with abnormal findings: Secondary | ICD-10-CM | POA: Insufficient documentation

## 2015-10-30 DIAGNOSIS — Z8542 Personal history of malignant neoplasm of other parts of uterus: Secondary | ICD-10-CM | POA: Diagnosis not present

## 2015-10-30 DIAGNOSIS — C541 Malignant neoplasm of endometrium: Secondary | ICD-10-CM

## 2015-10-30 NOTE — Patient Instructions (Signed)
We'll notify you of the results from today. Please return to see Dr. Alycia Rossetti in 6 months.

## 2015-10-30 NOTE — Progress Notes (Signed)
Consult Note: Gyn-Onc  Jill Cooke 74 y.o. female  CC:  Chief Complaint  Patient presents with  . Endometrial cancer    F/U    HPI: 74 y/o [place] at 74 years old. Patient noted clear vaginal discharge that progressed to bleeding the following month. Seen by Dr. Clearence Cheek in 01/2012 and atrophic vaginitis suspected. Vaginal bleeding continued and she was referred to Dr. [name], Treated with (?) Megace and antibiotics with out improvement. Pelvic U/S 04/2012 c/w 2cm endometrial stripe and retroverted uterus. EMB unsuccesful. She was taken to the OR by Dr. [name] on 06/24/2012. D&C hysteroscopy was not feasible because of severe cervical stenosis noted.    Patient was administered cytotec vaginally and underwent uterine dilatation and curettage under  anesthesia 08/09/2012. Operative findings: Cervix enlarged to 5 cm. No gross lesions, no cul de sac nodularity. Unable to assess adnexae because of the body habitus. Uterus sounded to 10cm. Uterine prolapse to almost the introitus  Pathology  1. Endocervix, curettage  - SCANT BENIGN ENDOCERVIX AND BENIGN SQUAMOUS FRAGMENTS.  2. Endometrium, curettage  - ADENOCARCINOMA WITH EXTENSIVE SQUAMOUS DIFFERENTIATION.  Microscopic Comment  There is mucus and fragments of adenocarcinoma with extensive squamous differentiation. There are focal areas with solid growth pattern and the features favor FIGO grade 2.  She underwent a total robotic hysterectomy bilateral salpingo-oophorectomy and pelvic lymph node dissection on October 15 of year 2013. Operative findings included to 10 week size uterus. Frozen section revealed a grade 2 lesion with less than 50% myometrial invasion. There was significant intra-abdominal obesity and visceral fat.  Pathology revealed a grade 2 endometrioid adenocarcinoma with 0.9 out of 2.0 cm of myometrial invasion. There was no lymphovascular space involvement. The cervix, uterus otherwise, and bilateral adnexa were negative. 0/9 lymph  nodes were involved. Her final stage was a stage IA grade 2 endometrioid adenocarcinoma.   Interval History: I saw her in December 2015. At that time her exam and Pap smear were both normal. Her primary care physician is Dr. [name]. I last saw her in May 2016 at which time her exam was unremarkable. She really has had a fairly unremarkable 6 months. She does suffer from some arthritis changes and recently started taking glucosamine which she feels is helping with her right knee. She has both a treadmill as well as the rowing machine at home. She is very discouraged by her weight gain. She attributes this to stress over the presidential election as her husband had the news on all the time. She is due for her mammograms. She recently saw her cardiologist and was placed on diltiazem and spironolactone.  Review of Systems  Constitutional: Weight slowly increasing Skin: No rash Cardiovascular: No chest pain, shortness of breath, or edema  Pulmonary: No cough or wheeze.  Gastro Intestinal:  No nausea, vomiting, constipation, or diarrhea reported. No bright red blood per rectum or change in bowel movement.  Genitourinary: No frequency, urgency, or dysuria.  Denies vaginal bleeding and discharge.  Musculoskeletal: No myalgia, stiff if sitting too long primarily her right knee Neurologic: No weakness, numbness, or change in gait.  Psychology: No changes  Current Meds:  Outpatient Encounter Prescriptions as of 10/30/2015  Medication Sig  . aspirin 81 MG tablet Take 81 mg by mouth daily.  . Calcium Carbonate-Vitamin D 600-400 MG-UNIT per tablet Take 1 tablet by mouth daily.   . Cholecalciferol (VITAMIN D PO) Take by mouth.  . diltiazem (CARDIZEM CD) 180 MG 24 hr capsule Take 1  capsule (180 mg total) by mouth daily.  . Multiple Vitamin (MULTIVITAMIN) capsule Take 1 capsule by mouth daily.  . Omega-3 Fatty Acids (FISH OIL) 1000 MG CAPS Take 1 capsule by mouth daily.  . ONE TOUCH ULTRA TEST test  strip USE AS INSTRUCTED TO CHECK BLOOD SUGAR 4 TIMES DAILY.  . Red Yeast Rice Extract (RED YEAST RICE PO) Take 1 tablet by mouth daily.  . vitamin B-12 (CYANOCOBALAMIN) 100 MCG tablet Take 100 mcg by mouth daily.  Marland Kitchen spironolactone (ALDACTONE) 25 MG tablet Take 25 mg by mouth daily.   No facility-administered encounter medications on file as of 10/30/2015.    Allergy: No Known Allergies  Social Hx:   Social History   Social History  . Marital Status: Married    Spouse Name: N/A  . Number of Children: N/A  . Years of Education: N/A   Occupational History  . Not on file.   Social History Main Topics  . Smoking status: Never Smoker   . Smokeless tobacco: Never Used  . Alcohol Use: Yes     Comment: ocassional wine or beer   2 x month  . Drug Use: No  . Sexual Activity: No   Other Topics Concern  . Not on file   Social History Narrative    Past Surgical Hx:  Past Surgical History  Procedure Laterality Date  . Dilation and curettage of uterus  08/09/2012    Procedure: DILATATION AND CURETTAGE;  Surgeon: Janie Morning, MD PHD;  Location: [facility];  Service: Gynecology;  Laterality: N/A;  . Robotic assisted lap vaginal hysterectomy  09/13/2012    Procedure: ROBOTIC ASSISTED LAPAROSCOPIC VAGINAL HYSTERECTOMY;  Surgeon: Imagene Gurney A. Alycia Rossetti, MD;  Location: [facility];  Service: Gynecology;  Laterality: N/A;  . Lymph node dissection  09/13/2012    Procedure: LYMPH NODE DISSECTION;  Surgeon: Imagene Gurney A. Alycia Rossetti, MD;  Location: [facility];  Service: Gynecology;  Laterality: Bilateral;  bilateral pelvic lymph node dissection    Past Medical Hx:  Past Medical History  Diagnosis Date  . Endometrial hyperplasia   . Postmenopausal bleeding   . Retroversion of uterus   . Vulvar ulceration   . Anxiety     with  diagnosis  . PAF (paroxysmal atrial fibrillation) ([place])     a. 06/2012 Admitted with asymp afib->converted on IV dilt.  No asa/coumadin 2/2 h/o vaginal bleeding pending d/c;  b. 07/20/2012  Echo:  EF 60-65%, Gr2 DD, mild LVH   . Vaginal bleeding   . Cancer ([place])     ENDOMETRIAL    Family Hx:  Family History  Problem Relation Age of Onset  .       Vitals:  Blood pressure 139/63, pulse 85, temperature 98 F (36.7 C), resp. rate 18, height 5\' 2"  (1.575 m), weight 211 lb 9.6 oz (95.981 kg), SpO2 100 %.  Physical Exam:  Well-nourished well-developed female in no acute distress.  Neck: Supple, no lymphadenopathy, no thyromegaly.  Lungs: Clear to auscultation bilateral.  Cardiovascular: Regular rate and rhythm  Abdomen: Well-healed surgical incisions. No appreciable incisional hernias were identified.  Abdomen is soft and nontender. No palpable masses, no hepatosplenomegaly. Exam is limited by habitus  Pelvic: Normal external female genitalia. The vaginal cuff has no visible lesions. Pap smear submitted without difficulty. Bimanual examination reveals no masses or nodularity. Rectal confirms  Extremities: 1+ edema  Assessment/Plan: 74 year old with a stage IA grade 2 endometrioid adenocarcinoma who did not meet GOG 99 criteria for vaginal cuff brachytherapy. She  has no evidence of recurrent disease. She return to see [place] in 6 months. We will notify her of the results for Pap smear from today. She will take the responsibility of getting her mammogram scheduled. In the interim she'll follow-up with Dr. Prince Rome as scheduled.   Khadir Roam A., MD 10/30/2015, 3:05 PM

## 2015-11-04 LAB — CYTOLOGY - PAP

## 2015-11-05 ENCOUNTER — Telehealth: Payer: Self-pay

## 2015-11-05 NOTE — Telephone Encounter (Signed)
Orders received from [facility] to contact the patient to update with PAP results are "normal". Patient was conatcted and updated with PAP results are "normal" , patient states understanding , denies further questions at this time .

## 2015-11-08 ENCOUNTER — Other Ambulatory Visit: Payer: Self-pay | Admitting: Nurse Practitioner

## 2016-04-15 ENCOUNTER — Ambulatory Visit: Payer: PPO | Attending: Gynecologic Oncology | Admitting: Gynecologic Oncology

## 2016-04-15 ENCOUNTER — Encounter: Payer: Self-pay | Admitting: Gynecologic Oncology

## 2016-04-15 VITALS — BP 137/65 | HR 95 | Temp 98.2°F | Ht 62.0 in | Wt 220.4 lb

## 2016-04-15 DIAGNOSIS — Z8542 Personal history of malignant neoplasm of other parts of uterus: Secondary | ICD-10-CM

## 2016-04-15 DIAGNOSIS — C569 Malignant neoplasm of unspecified ovary: Secondary | ICD-10-CM | POA: Insufficient documentation

## 2016-04-15 DIAGNOSIS — Z79899 Other long term (current) drug therapy: Secondary | ICD-10-CM | POA: Insufficient documentation

## 2016-04-15 DIAGNOSIS — I480 Paroxysmal atrial fibrillation: Secondary | ICD-10-CM | POA: Diagnosis not present

## 2016-04-15 DIAGNOSIS — Z7982 Long term (current) use of aspirin: Secondary | ICD-10-CM | POA: Insufficient documentation

## 2016-04-15 DIAGNOSIS — Z9889 Other specified postprocedural states: Secondary | ICD-10-CM | POA: Diagnosis not present

## 2016-04-15 DIAGNOSIS — R05 Cough: Secondary | ICD-10-CM | POA: Insufficient documentation

## 2016-04-15 DIAGNOSIS — F419 Anxiety disorder, unspecified: Secondary | ICD-10-CM | POA: Insufficient documentation

## 2016-04-15 DIAGNOSIS — C541 Malignant neoplasm of endometrium: Secondary | ICD-10-CM

## 2016-04-15 NOTE — Progress Notes (Signed)
Consult Note: Gyn-Onc  Jill Cooke 75 y.o. female  CC:  Chief Complaint  Patient presents with  . Follow-up    HPI: 75 y/o [name] at 75 years old. Patient noted clear vaginal discharge that progressed to bleeding the following month. Seen by Dr. Clearence Cheek in 01/2012 and atrophic vaginitis suspected. Vaginal bleeding continued and she was referred to Dr. [name], Treated with (?) Megace and antibiotics with out improvement. Pelvic U/S 04/2012 c/w 2cm endometrial stripe and retroverted uterus. EMB unsuccesful. She was taken to the OR by Dr. [name] on 06/24/2012. D&C hysteroscopy was not feasible because of severe cervical stenosis noted.    Patient was administered cytotec vaginally and underwent uterine dilatation and curettage under  anesthesia 08/09/2012. Operative findings: Cervix enlarged to 5 cm. No gross lesions, no cul de sac nodularity. Unable to assess adnexae because of the body habitus. Uterus sounded to 10cm. Uterine prolapse to almost the introitus  Pathology  1. Endocervix, curettage  - SCANT BENIGN ENDOCERVIX AND BENIGN SQUAMOUS FRAGMENTS.  2. Endometrium, curettage  - ADENOCARCINOMA WITH EXTENSIVE SQUAMOUS DIFFERENTIATION.  Microscopic Comment  There is mucus and fragments of adenocarcinoma with extensive squamous differentiation. There are focal areas with solid growth pattern and the features favor FIGO grade 2.  She underwent a total robotic hysterectomy bilateral salpingo-oophorectomy and pelvic lymph node dissection on September 13, 2012. Operative findings included to 10 week size uterus. Frozen section revealed a grade 2 lesion with less than 50% myometrial invasion. There was significant intra-abdominal obesity and visceral fat.  Pathology revealed a grade 2 endometrioid adenocarcinoma with 0.9 out of 2.0 cm of myometrial invasion. There was no lymphovascular space involvement. The cervix, uterus otherwise, and bilateral adnexa were negative. 0/9 lymph nodes were involved. Her  final stage was a stage IA grade 2 endometrioid adenocarcinoma.   Interval History: I saw her in November 2016. At that time her exam and Pap smear were both normal. Her primary care physician was Dr. [name], but she is left eye practice that she'll be seeing Dr. [name]. She feels that she is retaining a lot of fluid and she can't get a refill on her spironolactone until she has a follow-up visit. Her weight is up about 9 pounds since November. Her review of systems is completely negative except with some allergy type symptoms and a cough related to allergies. She is due for her mammogram. She will be due for her colonoscopy in 2018. She believes that the last colonoscopy she has to have. She has had some stress as they had some issues with her home in [place] the cost them quite a bit of money. She states that she stays very busy with [employer] taking care of many animals and does get a fair amount of exercise.  Review of Systems  Constitutional: Weight increasing Skin: No rash Cardiovascular: No chest pain, shortness of breath, + edema  Pulmonary: No cough or wheeze.  Gastro Intestinal:  No nausea, vomiting, constipation, or diarrhea reported. No change in bowel movement.  Genitourinary: No frequency, urgency, or dysuria.  Denies vaginal bleeding and discharge.  Musculoskeletal: No myalgia, stiff if sitting too long primarily her right knee Neurologic: No weakness, numbness, or change in gait.  Psychology: No changes  Current Meds:  Outpatient Encounter Prescriptions as of 04/15/2016  Medication Sig  . aspirin 81 MG tablet Take 81 mg by mouth daily.  . Calcium Carbonate-Vitamin D 600-400 MG-UNIT per tablet Take 1 tablet by mouth daily.   Marland Kitchen  Cholecalciferol (VITAMIN D PO) Take by mouth.  . diltiazem (CARDIZEM CD) 180 MG 24 hr capsule Take 1 capsule (180 mg total) by mouth daily.  . Multiple Vitamin (MULTIVITAMIN) capsule Take 1 capsule by mouth daily.  . ONE TOUCH ULTRA TEST test  strip USE AS INSTRUCTED TO CHECK BLOOD SUGAR 4 TIMES DAILY.  . Red Yeast Rice Extract (RED YEAST RICE PO) Take 1 tablet by mouth daily.  . vitamin B-12 (CYANOCOBALAMIN) 100 MCG tablet Take 100 mcg by mouth daily.  . Omega-3 Fatty Acids (FISH OIL) 1000 MG CAPS Take 1 capsule by mouth daily. Reported on 04/15/2016  . spironolactone (ALDACTONE) 25 MG tablet Take 25 mg by mouth daily.   No facility-administered encounter medications on file as of 04/15/2016.    Allergy: No Known Allergies  Social Hx:   Social History   Social History  . Marital Status: Married    Spouse Name: N/A  . Number of Children: N/A  . Years of Education: N/A   Occupational History  . Not on file.   Social History Main Topics  . Smoking status: Never Smoker   . Smokeless tobacco: Never Used  . Alcohol Use: No     Comment: ocassional wine or beer   2 x month  . Drug Use: No  . Sexual Activity: No   Other Topics Concern  . Not on file   Social History Narrative    Past Surgical Hx:  Past Surgical History  Procedure Laterality Date  . Dilation and curettage of uterus  08/09/2012    Procedure: DILATATION AND CURETTAGE;  Surgeon: Janie Morning, MD PHD;  Location: [facility];  Service: Gynecology;  Laterality: N/A;  . Robotic assisted lap vaginal hysterectomy  09/13/2012    Procedure: ROBOTIC ASSISTED LAPAROSCOPIC VAGINAL HYSTERECTOMY;  Surgeon: Imagene Gurney A. Alycia Rossetti, MD;  Location: [facility];  Service: Gynecology;  Laterality: N/A;  . Lymph node dissection  09/13/2012    Procedure: LYMPH NODE DISSECTION;  Surgeon: Imagene Gurney A. Alycia Rossetti, MD;  Location: [facility];  Service: Gynecology;  Laterality: Bilateral;  bilateral pelvic lymph node dissection    Past Medical Hx:  Past Medical History  Diagnosis Date  . Endometrial hyperplasia   . Postmenopausal bleeding   . Retroversion of uterus   . Vulvar ulceration   . Anxiety     with  diagnosis  . PAF (paroxysmal atrial fibrillation) ([place])     a. 06/2012 Admitted with asymp  afib->converted on IV dilt.  No asa/coumadin 2/2 h/o vaginal bleeding pending d/c;  b. 07/20/2012 Echo:  EF 60-65%, Gr2 DD, mild LVH   . Vaginal bleeding   . Cancer ([place])     ENDOMETRIAL    Family Hx:  Family History  Problem Relation Age of Onset  .       Vitals:  Blood pressure 137/65, pulse 95, temperature 98.2 F (36.8 C), temperature source Oral, height 5\' 2"  (1.575 m), weight 220 lb 6 oz (99.961 kg).  Physical Exam:  Well-nourished well-developed female in no acute distress.  Neck: Supple, no lymphadenopathy, no thyromegaly.  Lungs: Clear to auscultation bilateral.  Cardiovascular: Regular rate and rhythm  Abdomen: Well-healed surgical incisions. No appreciable incisional hernias were identified.  Abdomen is soft and nontender. No palpable masses, no hepatosplenomegaly. Exam is limited by habitus, morbidly obese.  Pelvic: Normal external female genitalia. The vaginal cuff has no visible lesions.  Bimanual examination reveals no masses or nodularity. Rectal confirms  Extremities: 1+ edema  Assessment/Plan: 75 year old with a stage  IA grade 2 endometrioid adenocarcinoma who did not meet GOG 99 criteria for vaginal cuff brachytherapy. She has no evidence of recurrent disease. She return to see [place] in 6 months. In the interim she'll follow-up with her primary physician as scheduled.   Tavio Biegel A., MD 04/15/2016, 2:06 PM

## 2016-04-15 NOTE — Patient Instructions (Signed)
Please return to clinic in 6 months. Follow-up with your other providers as scheduled.

## 2016-09-29 ENCOUNTER — Encounter: Payer: Self-pay | Admitting: Nurse Practitioner

## 2016-10-10 ENCOUNTER — Other Ambulatory Visit: Payer: Self-pay | Admitting: Nurse Practitioner

## 2016-10-13 ENCOUNTER — Ambulatory Visit (INDEPENDENT_AMBULATORY_CARE_PROVIDER_SITE_OTHER): Payer: PPO | Admitting: Nurse Practitioner

## 2016-10-13 ENCOUNTER — Encounter (INDEPENDENT_AMBULATORY_CARE_PROVIDER_SITE_OTHER): Payer: Self-pay

## 2016-10-13 ENCOUNTER — Encounter: Payer: Self-pay | Admitting: Nurse Practitioner

## 2016-10-13 ENCOUNTER — Ambulatory Visit: Payer: PPO | Admitting: Gynecologic Oncology

## 2016-10-13 VITALS — BP 120/70 | HR 73 | Ht 62.0 in | Wt 219.8 lb

## 2016-10-13 DIAGNOSIS — I480 Paroxysmal atrial fibrillation: Secondary | ICD-10-CM

## 2016-10-13 DIAGNOSIS — E7800 Pure hypercholesterolemia, unspecified: Secondary | ICD-10-CM

## 2016-10-13 LAB — CBC
HCT: 39.8 % (ref 35.0–45.0)
Hemoglobin: 13.4 g/dL (ref 11.7–15.5)
MCH: 30.6 pg (ref 27.0–33.0)
MCHC: 33.7 g/dL (ref 32.0–36.0)
MCV: 90.9 fL (ref 80.0–100.0)
MPV: 8.7 fL (ref 7.5–12.5)
Platelets: 386 10*3/uL (ref 140–400)
RBC: 4.38 MIL/uL (ref 3.80–5.10)
RDW: 13.9 % (ref 11.0–15.0)
WBC: 10.1 10*3/uL (ref 3.8–10.8)

## 2016-10-13 LAB — BASIC METABOLIC PANEL
BUN: 20 mg/dL (ref 7–25)
CO2: 29 mmol/L (ref 20–31)
Calcium: 9.3 mg/dL (ref 8.6–10.4)
Chloride: 101 mmol/L (ref 98–110)
Creat: 1.02 mg/dL — ABNORMAL HIGH (ref 0.60–0.93)
Glucose, Bld: 102 mg/dL — ABNORMAL HIGH (ref 65–99)
Potassium: 4.2 mmol/L (ref 3.5–5.3)
Sodium: 140 mmol/L (ref 135–146)

## 2016-10-13 LAB — LIPID PANEL
Cholesterol: 187 mg/dL (ref <200)
HDL: 44 mg/dL — ABNORMAL LOW (ref >50)
LDL Cholesterol: 117 mg/dL — ABNORMAL HIGH (ref <100)
Total CHOL/HDL Ratio: 4.3 Ratio (ref <5.0)
Triglycerides: 130 mg/dL (ref <150)
VLDL: 26 mg/dL (ref <30)

## 2016-10-13 LAB — HEPATIC FUNCTION PANEL
ALT: 15 U/L (ref 6–29)
AST: 15 U/L (ref 10–35)
Albumin: 3.9 g/dL (ref 3.6–5.1)
Alkaline Phosphatase: 89 U/L (ref 33–130)
Bilirubin, Direct: 0.1 mg/dL (ref <=0.2)
Indirect Bilirubin: 0.2 mg/dL (ref 0.2–1.2)
Total Bilirubin: 0.3 mg/dL (ref 0.2–1.2)
Total Protein: 7.2 g/dL (ref 6.1–8.1)

## 2016-10-13 LAB — TSH: TSH: 2.57 mIU/L

## 2016-10-13 MED ORDER — DILTIAZEM HCL ER COATED BEADS 180 MG PO CP24: 180.0000 mg | ORAL_CAPSULE | Freq: Every day | ORAL | 3 refills | Status: DC

## 2016-10-13 NOTE — Progress Notes (Signed)
CARDIOLOGY OFFICE NOTE  Date:  10/13/2016    Jill Cooke Date of Birth: 08/26/1941 Medical Record [id number]  PCP:  Suzanna Obey, MD  Cardiologist:  Servando Snare Nahser  Chief Complaint  Patient presents with  . Atrial Fibrillation    One year check - seen for Dr. Acie Fredrickson    History of Present Illness: Jill Cooke is a 75 y.o. female who presents today for a one year check. Seen for Dr. Acie Fredrickson.  She has had PAF back in 2013 following a D&C for vaginal bleeding. Converted with IV diltiazem. Echo with normal EF and grade 2 diastolic dysfunction and mild LVH at that time. No other cardiac history. She is obese.   I last saw her in November of 2016 and she was doing ok. She had gotten off track with taking care of herself and had gained weight. Rhythm had been stable.   Comes in today. Here alone. She is doing well. Still struggles with her weight. Husband now with diabetes - they are both trying to eat better. She is fasting today. No chest pain. Breathing is stable. No problems with her rhythm.   Past Medical History:  Diagnosis Date  . Anxiety    with  diagnosis  . Cancer ([place])    ENDOMETRIAL  . Endometrial hyperplasia   . PAF (paroxysmal atrial fibrillation) ([place])    a. 06/2012 Admitted with asymp afib->converted on IV dilt.  No asa/coumadin 2/2 h/o vaginal bleeding pending d/c;  b. 07/20/2012 Echo:  EF 60-65%, Gr2 DD, mild LVH   . Postmenopausal bleeding   . Retroversion of uterus   . Vaginal bleeding   . Vulvar ulceration     Past Surgical History:  Procedure Laterality Date  . DILATION AND CURETTAGE OF UTERUS  08/09/2012   Procedure: DILATATION AND CURETTAGE;  Surgeon: Janie Morning, MD PHD;  [facility];  Service: Gynecology;  Laterality: N/A;  . LYMPH NODE DISSECTION  09/13/2012   Procedure: LYMPH NODE DISSECTION;  Surgeon: Imagene Gurney A. Alycia Rossetti, MD;  [facility];  Service: Gynecology;  Laterality: Bilateral;  bilateral pelvic lymph node dissection  .  ROBOTIC ASSISTED LAP VAGINAL HYSTERECTOMY  09/13/2012   Procedure: ROBOTIC ASSISTED LAPAROSCOPIC VAGINAL HYSTERECTOMY;  Surgeon: Imagene Gurney A. Alycia Rossetti, MD;  [facility];  Service: Gynecology;  Laterality: N/A;     Medications: Current Outpatient Prescriptions  Medication Sig Dispense Refill  . Ascorbic Acid (VITAMIN C) 1000 MG tablet Take 1,000 mg by mouth daily.    Marland Kitchen aspirin 81 MG tablet Take 81 mg by mouth daily.    . Calcium Carbonate-Vitamin D 600-400 MG-UNIT per tablet Take 1 tablet by mouth daily.     . Cholecalciferol (VITAMIN D PO) Take 1,000 Units by mouth daily.     Marland Kitchen diltiazem (CARDIZEM CD) 180 MG 24 hr capsule Take 1 capsule (180 mg total) by mouth daily. 90 capsule 3  . Glucosamine-Chondroit-Vit C-Mn (GLUCOSAMINE CHONDR 1500 COMPLX PO) Take 2 tablets by mouth daily.    . Multiple Vitamin (MULTIVITAMIN) capsule Take 1 capsule by mouth daily.    . Omega-3 Fatty Acids (FISH OIL) 1000 MG CAPS Take 1 capsule by mouth daily. Reported on 04/15/2016    . vitamin B-12 (CYANOCOBALAMIN) 1000 MCG tablet Take 1,000 mcg by mouth daily.     No current facility-administered medications for this visit.     Allergies: No Known Allergies  Social History: The patient  reports that she has never smoked. She has never  used smokeless tobacco. She reports that she does not drink alcohol or use drugs.   Family History: The patient's family history is not on file.   Review of Systems: Please see the history of present illness.   Otherwise, the review of systems is positive for none.   All other systems are reviewed and negative.   Physical Exam: VS:  BP 120/70   Pulse 73   Ht 5\' 2"  (1.575 m)   Wt 219 lb 12.8 oz (99.7 kg)   BMI 40.20 kg/m  .  BMI Body mass index is 40.2 kg/m.  Wt Readings from Last 3 Encounters:  10/13/16 219 lb 12.8 oz (99.7 kg)  04/15/16 220 lb 6 oz (100 kg)  10/30/15 211 lb 9.6 oz (96 kg)    General: Pleasant. Well developed, well nourished and in no acute  distress.  She remains obese.  HEENT: Normal.  Neck: Supple, no JVD, carotid bruits, or masses noted.  Cardiac: Regular rate and rhythm. Heart tones are distant. No edema.  Respiratory:  Lungs are clear to auscultation bilaterally with normal work of breathing.  GI: Soft and nontender.  MS: No deformity or atrophy. Gait and ROM intact.  Skin: Warm and dry. Color is normal.  Neuro:  Strength and sensation are intact and no gross focal deficits noted.  Psych: Alert, appropriate and with normal affect.   LABORATORY DATA:  EKG:  EKG is ordered today. This demonstrates NSR with poor R wave progression - unchanged.  Lab Results  Component Value Date   WBC 11.3 (H) 10/15/2015   HGB 13.5 10/15/2015   HCT 39.7 10/15/2015   PLT 379 10/15/2015   GLUCOSE 102 (H) 10/15/2015   CHOL 194 10/15/2015   TRIG 130 10/15/2015   HDL 45 (L) 10/15/2015   LDLCALC 123 10/15/2015   ALT 12 10/15/2015   AST 14 10/15/2015   NA 138 10/15/2015   K 4.4 10/15/2015   CL 102 10/15/2015   CREATININE 1.07 (H) 10/15/2015   BUN 25 10/15/2015   CO2 28 10/15/2015   TSH 2.476 10/15/2015   INR 1.07 07/19/2012    BNP (last 3 results) No results for input(s): BNP in the last 8760 hours.  ProBNP (last 3 results) No results for input(s): PROBNP in the last 8760 hours.   Other Studies Reviewed Today:  Echo Study Conclusions from 2013  - Left ventricle: The cavity size was normal. Wall thickness was increased in a pattern of mild LVH. Systolic function was normal. The estimated ejection fraction was in the range of 60% to 65%. Wall motion was normal; there were no regional wall motion abnormalities. Features are consistent with a pseudonormal left ventricular filling pattern, with concomitant abnormal relaxation and increased filling pressure (grade 2 diastolic dysfunction). - Aortic valve: There was no stenosis. - Mitral valve: Trivial regurgitation. - Left atrium: The atrium was mildly  dilated. - Right ventricle: The cavity size was normal. Systolic function was normal. - Tricuspid valve: Peak RV-RA gradient: 33mm Hg (S). - Pulmonary arteries: PA peak pressure: 39mm Hg (S). - Inferior vena cava: The vessel was normal in size; the respirophasic diameter changes were in the normal range (= 50%); findings are consistent with normal central venous pressure. Impressions:  - Normal LV size with mild LV hypertrophy. EF 60-65%. Moderate diastolic dysfunction. Normal RV size and systolic function. No significant valvular dysfunction.  Assessment/Plan: 1. PAF - remains in sinus. I have refilled her Diltiazem today. See back in a year with EKG.  If she were to have a recurrent episode of AF - she will be committed to long term anticoagulation other than aspirin. Discussed previously with Dr. Acie Fredrickson who was in agreement with this plan.   2. Obesity - knows she needs to get back on track with walking/diet/etc. Needs to really make her health a priority.   3. DM - followed by PCP   4. HLD - checking labs today.   Current medicines are reviewed with the patient today.  The patient does not have concerns regarding medicines other than what has been noted above.  The following changes have been made:  See above.  Labs/ tests ordered today include:    Orders Placed This Encounter  Procedures  . Basic metabolic panel  . CBC  . Hepatic function panel  . Lipid panel  . TSH  . EKG 12-Lead     Disposition:   FU with me in 1 year.   Patient is agreeable to this plan and will call if any problems develop in the interim.   Signed: Burtis Junes, RN, ANP-C 10/13/2016 10:30 AM  [facility] [address] [place], [place]  [address] Phone: [phone] Fax: [phone]

## 2016-10-13 NOTE — Patient Instructions (Addendum)
We will be checking the following labs today - BMET, CBC, HPF, Lipids and TSH   Medication Instructions:    Continue with your current medicines.   I sent in your refill for your Diltiazem    Testing/Procedures To Be Arranged:  N/A  Follow-Up:   See me in one year with fasting labs.     Other Special Instructions:   N/A    If you need a refill on your cardiac medications before your next appointment, please call your pharmacy.   Call the [place] office at [phone] if you have any questions, problems or concerns.

## 2016-10-21 ENCOUNTER — Ambulatory Visit: Payer: PPO | Admitting: Gynecologic Oncology

## 2016-11-10 ENCOUNTER — Telehealth: Payer: Self-pay | Admitting: Family Medicine

## 2016-11-10 NOTE — Telephone Encounter (Signed)
Patient received an automated call for her wellness visit. She is not a patient here and does not see any doctors here in [facility]. She would like to be removed from the list. Please advise.

## 2016-11-18 ENCOUNTER — Encounter: Payer: Self-pay | Admitting: Gynecologic Oncology

## 2016-11-18 ENCOUNTER — Other Ambulatory Visit: Payer: Self-pay | Admitting: Gynecologic Oncology

## 2016-11-18 ENCOUNTER — Other Ambulatory Visit (HOSPITAL_COMMUNITY)
Admission: RE | Admit: 2016-11-18 | Discharge: 2016-11-18 | Disposition: A | Discharge status: Home / Self Care | Payer: PPO | Source: Ambulatory Visit | Attending: Gynecologic Oncology | Admitting: Gynecologic Oncology

## 2016-11-18 ENCOUNTER — Ambulatory Visit: Payer: PPO | Attending: Gynecologic Oncology | Admitting: Gynecologic Oncology

## 2016-11-18 VITALS — BP 158/67 | HR 77 | Temp 98.1°F | Ht 62.0 in | Wt 218.6 lb

## 2016-11-18 DIAGNOSIS — Z01411 Encounter for gynecological examination (general) (routine) with abnormal findings: Secondary | ICD-10-CM | POA: Insufficient documentation

## 2016-11-18 DIAGNOSIS — C541 Malignant neoplasm of endometrium: Secondary | ICD-10-CM

## 2016-11-18 DIAGNOSIS — Z8542 Personal history of malignant neoplasm of other parts of uterus: Secondary | ICD-10-CM | POA: Diagnosis not present

## 2016-11-18 DIAGNOSIS — Z90710 Acquired absence of both cervix and uterus: Secondary | ICD-10-CM | POA: Insufficient documentation

## 2016-11-18 DIAGNOSIS — E669 Obesity, unspecified: Secondary | ICD-10-CM | POA: Insufficient documentation

## 2016-11-18 DIAGNOSIS — F419 Anxiety disorder, unspecified: Secondary | ICD-10-CM | POA: Insufficient documentation

## 2016-11-18 DIAGNOSIS — I480 Paroxysmal atrial fibrillation: Secondary | ICD-10-CM | POA: Diagnosis not present

## 2016-11-18 NOTE — Progress Notes (Signed)
Consult Note: Gyn-Onc  Jill Cooke 75 y.o. female  CC:  Chief Complaint  Patient presents with  . endometrial cancer    HPI: 75 y/o [name] at 75 years old. Patient noted clear vaginal discharge that progressed to bleeding the following month. Seen by Dr. Clearence Cheek in 01/2012 and atrophic vaginitis suspected. Vaginal bleeding continued and she was referred to Dr. [name], Treated with (?) Megace and antibiotics with out improvement. Pelvic U/S 04/2012 c/w 2cm endometrial stripe and retroverted uterus. EMB unsuccesful. She was taken to the OR by Dr. [name] on 06/24/2012. D&C hysteroscopy was not feasible because of severe cervical stenosis noted.    Patient was administered cytotec vaginally and underwent uterine dilatation and curettage under  anesthesia 08/09/2012. Operative findings: Cervix enlarged to 5 cm. No gross lesions, no cul de sac nodularity. Unable to assess adnexae because of the body habitus. Uterus sounded to 10cm. Uterine prolapse to almost the introitus  Pathology  1. Endocervix, curettage  - SCANT BENIGN ENDOCERVIX AND BENIGN SQUAMOUS FRAGMENTS.  2. Endometrium, curettage  - ADENOCARCINOMA WITH EXTENSIVE SQUAMOUS DIFFERENTIATION.  Microscopic Comment  There is mucus and fragments of adenocarcinoma with extensive squamous differentiation. There are focal areas with solid growth pattern and the features favor FIGO grade 2.  She underwent a total robotic hysterectomy bilateral salpingo-oophorectomy and pelvic lymph node dissection on September 13, 2012. Operative findings included to 10 week size uterus. Frozen section revealed a grade 2 lesion with less than 50% myometrial invasion. There was significant intra-abdominal obesity and visceral fat.  Pathology revealed a grade 2 endometrioid adenocarcinoma with 0.9 out of 2.0 cm of myometrial invasion. There was no lymphovascular space involvement. The cervix, uterus otherwise, and bilateral adnexa were negative. 0/9 lymph nodes were  involved. Her final stage was a stage IA grade 2 endometrioid adenocarcinoma.   Interval History: I saw her in May 2017. At that time her exam was unremarkable and her exam and and Pap smear were both normal in November of last year. Her primary care physician was Dr. [name], but she is left eye practice that she'll be seeing Dr. [name].   She's overall doing fairly well since we last saw her. She did not have a mammogram in she's been overdue for at least a year. She is due for her colonoscopy in 2018. There are no new medical problems and her family.  Review of Systems  Constitutional: Weight stable Skin: No rash Cardiovascular: No chest pain, slight shortness of breath with activity, + edema  Pulmonary: No cough or wheeze.  Gastro Intestinal:  No nausea, vomiting, constipation, or diarrhea reported. No change in bowel movement.  Genitourinary: No frequency, urgency, or dysuria.  Denies vaginal bleeding and discharge.  Musculoskeletal: No myalgia, occasional right knee pain Neurologic: No weakness, numbness, or change in gait.  Psychology: No changes  Current Meds:  Outpatient Encounter Prescriptions as of 11/18/2016  Medication Sig Dispense Refill  . Ascorbic Acid (VITAMIN C) 1000 MG tablet Take 1,000 mg by mouth daily.    Marland Kitchen aspirin 81 MG tablet Take 81 mg by mouth daily.    . Calcium Carbonate-Vitamin D 600-400 MG-UNIT per tablet Take 1 tablet by mouth daily.     . Cholecalciferol (VITAMIN D PO) Take 1,000 Units by mouth daily.     Marland Kitchen diltiazem (CARDIZEM CD) 180 MG 24 hr capsule Take 1 capsule (180 mg total) by mouth daily. 90 capsule 3  . Glucosamine-Chondroit-Vit C-Mn (GLUCOSAMINE CHONDR 1500 COMPLX PO) Take 2  tablets by mouth daily.    . Omega-3 Fatty Acids (FISH OIL) 1000 MG CAPS Take 1 capsule by mouth daily. Reported on 04/15/2016    . Red Yeast Rice 600 MG TABS Take 1 tablet by mouth daily.    . vitamin B-12 (CYANOCOBALAMIN) 1000 MCG tablet Take 1,000 mcg by mouth  daily.    . Multiple Vitamin (MULTIVITAMIN) capsule Take 1 capsule by mouth daily.     No facility-administered encounter medications on file as of 11/18/2016.     Allergy: No Known Allergies  Social Hx:   Social History   Social History  . Marital status: Married    Spouse name: N/A  . Number of children: N/A  . Years of education: N/A   Occupational History  . Not on file.   Social History Main Topics  . Smoking status: Never Smoker  . Smokeless tobacco: Never Used  . Alcohol use No     Comment: ocassional wine or beer   2 x month  . Drug use: No  . Sexual activity: No   Other Topics Concern  . Not on file   Social History Narrative  . No narrative on file    Past Surgical Hx:  Past Surgical History:  Procedure Laterality Date  . DILATION AND CURETTAGE OF UTERUS  08/09/2012   Procedure: DILATATION AND CURETTAGE;  Surgeon: Janie Morning, MD PHD;  [facility];  Service: Gynecology;  Laterality: N/A;  . LYMPH NODE DISSECTION  09/13/2012   Procedure: LYMPH NODE DISSECTION;  Surgeon: Imagene Gurney A. Alycia Rossetti, MD;  [facility];  Service: Gynecology;  Laterality: Bilateral;  bilateral pelvic lymph node dissection  . ROBOTIC ASSISTED LAP VAGINAL HYSTERECTOMY  09/13/2012   Procedure: ROBOTIC ASSISTED LAPAROSCOPIC VAGINAL HYSTERECTOMY;  Surgeon: Imagene Gurney A. Alycia Rossetti, MD;  [facility];  Service: Gynecology;  Laterality: N/A;    Past Medical Hx:  Past Medical History:  Diagnosis Date  . Anxiety    with  diagnosis  . Cancer ([place])    ENDOMETRIAL  . Endometrial hyperplasia   . PAF (paroxysmal atrial fibrillation) ([place])    a. 06/2012 Admitted with asymp afib->converted on IV dilt.  No asa/coumadin 2/2 h/o vaginal bleeding pending d/c;  b. 07/20/2012 Echo:  EF 60-65%, Gr2 DD, mild LVH   . Postmenopausal bleeding   . Retroversion of uterus   . Vaginal bleeding   . Vulvar ulceration     Family Hx:  History reviewed. No pertinent family history.  Vitals:  Blood pressure  (!) 158/67, pulse 77, temperature 98.1 F (36.7 C), temperature source Oral, height 5\' 2"  (1.575 m), weight 218 lb 9.6 oz (99.2 kg), SpO2 99 %.  Physical Exam:  Well-nourished well-developed female in no acute distress.  Neck: Supple, no lymphadenopathy, no thyromegaly.  Lungs: Clear to auscultation bilateral.  Cardiovascular: Regular rate and rhythm  Abdomen: Well-healed surgical incisions. No appreciable incisional hernias were identified.  Abdomen is soft and nontender. No palpable masses, no hepatosplenomegaly. Exam is limited by habitus, morbidly obese.  Groins: No lymphadenopathy though exam is limited by habitus. There is a candidal infection of her left groin extending along the crease of the groin onto her anterior thigh and her pannus where the thigh and pannus touch.  Pelvic: Normal external female genitalia. The vaginal cuff has no visible lesions.  Pap smear submitted without difficulty. Bimanual examination reveals no masses or nodularity. Rectal confirms  Extremities: 1+ edema  Assessment/Plan: 75 year old with a stage IA grade 2 endometrioid adenocarcinoma who  did not meet GOG 99 criteria for vaginal cuff brachytherapy. She has no evidence of recurrent disease. She return to see [place] in 6 months. In the interim she'll follow-up with her primary physician as scheduled. We will follow-up on the results of her Pap smear from today.  She will take the responsibility of scheduling both her mammogram as well as colonoscopy. The importance of the screening tests were discussed with the patient. She does have antifungal medication at home and will start using it.  Decarlos Empey A., MD 11/18/2016, 10:19 AM

## 2016-11-18 NOTE — Patient Instructions (Signed)
We will notify you of the results of your Pap smear from today. Please return to see GYN oncology in 6 months. Happy holidays!

## 2016-11-26 ENCOUNTER — Telehealth: Payer: Self-pay | Admitting: Gynecologic Oncology

## 2016-11-26 NOTE — Telephone Encounter (Signed)
Message left for patient with pap smear results: negative.  Instructed to call for any questions or concerns.  

## 2016-11-30 LAB — CYTOLOGY - PAP: Diagnosis: NEGATIVE

## 2017-05-26 ENCOUNTER — Ambulatory Visit: Payer: PPO | Attending: Gynecologic Oncology | Admitting: Gynecologic Oncology

## 2017-05-26 ENCOUNTER — Encounter: Payer: Self-pay | Admitting: Gynecologic Oncology

## 2017-05-26 VITALS — BP 126/63 | HR 88 | Temp 97.6°F | Resp 20 | Wt 214.6 lb

## 2017-05-26 DIAGNOSIS — Z7982 Long term (current) use of aspirin: Secondary | ICD-10-CM | POA: Diagnosis not present

## 2017-05-26 DIAGNOSIS — Z90722 Acquired absence of ovaries, bilateral: Secondary | ICD-10-CM | POA: Insufficient documentation

## 2017-05-26 DIAGNOSIS — I480 Paroxysmal atrial fibrillation: Secondary | ICD-10-CM | POA: Diagnosis not present

## 2017-05-26 DIAGNOSIS — Z8542 Personal history of malignant neoplasm of other parts of uterus: Secondary | ICD-10-CM

## 2017-05-26 DIAGNOSIS — Z08 Encounter for follow-up examination after completed treatment for malignant neoplasm: Secondary | ICD-10-CM | POA: Insufficient documentation

## 2017-05-26 DIAGNOSIS — C541 Malignant neoplasm of endometrium: Secondary | ICD-10-CM

## 2017-05-26 DIAGNOSIS — Z79899 Other long term (current) drug therapy: Secondary | ICD-10-CM | POA: Insufficient documentation

## 2017-05-26 DIAGNOSIS — Z90710 Acquired absence of both cervix and uterus: Secondary | ICD-10-CM | POA: Diagnosis not present

## 2017-05-26 NOTE — Progress Notes (Signed)
Consult Note: Gyn-Onc  Jill Cooke 76 y.o. female  CC:  Chief Complaint  Patient presents with  . Endometrial Cancer    HPI: 76 y/o Jill Cooke at 76 years old. Patient noted clear vaginal discharge that progressed to bleeding the following month. Seen by Dr. Clearence Cheek in 01/2012 and atrophic vaginitis suspected. Vaginal bleeding continued and she was referred to Dr. [name], Treated with (?) Megace and antibiotics with out improvement. Pelvic U/S 04/2012 c/w 2cm endometrial stripe and retroverted uterus. EMB unsuccesful. She was taken to the OR by Dr. [name] on 06/24/2012. D&C hysteroscopy was not feasible because of severe cervical stenosis noted.    Patient was administered cytotec vaginally and underwent uterine dilatation and curettage under  anesthesia 08/09/2012. Operative findings: Cervix enlarged to 5 cm. No gross lesions, no cul de sac nodularity. Unable to assess adnexae because of the body habitus. Uterus sounded to 10cm. Uterine prolapse to almost the introitus  Pathology  1. Endocervix, curettage  - SCANT BENIGN ENDOCERVIX AND BENIGN SQUAMOUS FRAGMENTS.  2. Endometrium, curettage  - ADENOCARCINOMA WITH EXTENSIVE SQUAMOUS DIFFERENTIATION.  Microscopic Comment  There is mucus and fragments of adenocarcinoma with extensive squamous differentiation. There are focal areas with solid growth pattern and the features favor FIGO grade 2.  She underwent a total robotic hysterectomy bilateral salpingo-oophorectomy and pelvic lymph node dissection on September 13, 2012. Operative findings included to 10 week size uterus. Frozen section revealed a grade 2 lesion with less than 50% myometrial invasion. There was significant intra-abdominal obesity and visceral fat.  Pathology revealed a grade 2 endometrioid adenocarcinoma with 0.9 out of 2.0 cm of myometrial invasion. There was no lymphovascular space involvement. The cervix, uterus otherwise, and bilateral adnexa were negative. 0/9 lymph nodes were  involved. Her final stage was a stage IA grade 2 endometrioid adenocarcinoma.   Interval History: I saw her in December 2017. At that time her exam and pap smear were unremarkable. Her primary care physician is Dr. [name].   She's overall doing fairly well since we last saw her.  She never had her mammogram. She has yet to schedule her colonoscopy. She states that since March her dog is been sick and is on chemotherapy and prednisone. She has been up since 5:00 and is tired. She will take naps during the day. She's been tired since she's been waking up more at night with her dog. Her husband has been diagnosed with type 2 diabetes and she's needing to help him remember to take his oral medications. She checks her sugars every morning and those have been normal. There are no new medical problems and her family. She otherwise denies any complaints.  Review of Systems  Constitutional: Weight stable Skin: No rash Cardiovascular: No chest pain, slight shortness of breath with activity, + edema  Pulmonary: No cough or wheeze.  Gastro Intestinal:  No nausea, vomiting, constipation, or diarrhea reported. Genitourinary: No frequency, urgency, or dysuria.  Denies vaginal bleeding and discharge.  Musculoskeletal: Occasional right knee pain Neurologic: No weakness, numbness, or change in gait.  Psychology: No changes  Current Meds:  Outpatient Encounter Prescriptions as of 05/26/2017  Medication Sig  . Ascorbic Acid (VITAMIN C) 1000 MG tablet Take 1,000 mg by mouth daily.  Marland Kitchen aspirin 81 MG tablet Take 81 mg by mouth daily.  . Calcium Carbonate-Vitamin D 600-400 MG-UNIT per tablet Take 1 tablet by mouth daily.   . Cholecalciferol (VITAMIN D PO) Take 1,000 Units by mouth daily.   Marland Kitchen diltiazem (  CARDIZEM CD) 180 MG 24 hr capsule Take 1 capsule (180 mg total) by mouth daily.  . Multiple Vitamin (MULTIVITAMIN) capsule Take 1 capsule by mouth daily.  . Omega-3 Fatty Acids (FISH OIL) 1000 MG CAPS Take 1 capsule  by mouth daily. Reported on 04/15/2016  . vitamin B-12 (CYANOCOBALAMIN) 1000 MCG tablet Take 1,000 mcg by mouth daily.  . [DISCONTINUED] Glucosamine-Chondroit-Vit C-Mn (GLUCOSAMINE CHONDR 1500 COMPLX PO) Take 2 tablets by mouth daily.  . [DISCONTINUED] Red Yeast Rice 600 MG TABS Take 1 tablet by mouth daily.   No facility-administered encounter medications on file as of 05/26/2017.     Allergy: No Known Allergies  Social Hx:   Social History   Social History  . Marital status: Married    Spouse name: N/A  . Number of children: N/A  . Years of education: N/A   Occupational History  . Not on file.   Social History Main Topics  . Smoking status: Never Smoker  . Smokeless tobacco: Never Used  . Alcohol use No     Comment: ocassional wine or beer   2 x month  . Drug use: No  . Sexual activity: No   Other Topics Concern  . Not on file   Social History Narrative  . No narrative on file    Past Surgical Hx:  Past Surgical History:  Procedure Laterality Date  . DILATION AND CURETTAGE OF UTERUS  08/09/2012   Procedure: DILATATION AND CURETTAGE;  Surgeon: Janie Morning, MD PHD;  Location: [facility];  Service: Gynecology;  Laterality: N/A;  . LYMPH NODE DISSECTION  09/13/2012   Procedure: LYMPH NODE DISSECTION;  Surgeon: Imagene Gurney A. Alycia Rossetti, MD;  Location: [facility];  Service: Gynecology;  Laterality: Bilateral;  bilateral pelvic lymph node dissection  . ROBOTIC ASSISTED LAP VAGINAL HYSTERECTOMY  09/13/2012   Procedure: ROBOTIC ASSISTED LAPAROSCOPIC VAGINAL HYSTERECTOMY;  Surgeon: Imagene Gurney A. Alycia Rossetti, MD;  Location: [facility];  Service: Gynecology;  Laterality: N/A;    Past Medical Hx:  Past Medical History:  Diagnosis Date  . Anxiety    with  diagnosis  . Cancer ([place])    ENDOMETRIAL  . Endometrial hyperplasia   . PAF (paroxysmal atrial fibrillation) ([place])    a. 06/2012 Admitted with asymp afib->converted on IV dilt.  No asa/coumadin 2/2 h/o vaginal bleeding pending d/c;  b. 07/20/2012 Echo:   EF 60-65%, Gr2 DD, mild LVH   . Postmenopausal bleeding   . Retroversion of uterus   . Vaginal bleeding   . Vulvar ulceration     Family Hx:  History reviewed. No pertinent family history.  Vitals:  Blood pressure 126/63, pulse 88, temperature 97.6 F (36.4 C), temperature source Oral, resp. rate 20, weight 214 lb 9.6 oz (97.3 kg), SpO2 98 %.  Physical Exam:  Well-nourished well-developed female in no acute distress.  Neck: Supple, no lymphadenopathy, no thyromegaly.  Lungs: Clear to auscultation bilateral.  Cardiovascular: Regular rate and rhythm  Abdomen: Well-healed surgical incisions. No appreciable incisional hernias were identified.  Abdomen is soft and nontender. No palpable masses, no hepatosplenomegaly. Exam is limited by habitus, morbidly obese.  Groins: No lymphadenopathy though exam is limited by habitus.  Pelvic: Normal external female genitalia. The vaginal cuff has no visible lesions. Bimanual examination reveals no masses or nodularity. Rectal confirms  Extremities: 1+ edema  Assessment/Plan: 76 year old with a stage IA grade 2 endometrioid adenocarcinoma who did not meet GOG 99 criteria for vaginal cuff brachytherapy. She has no evidence of recurrent disease. She return  to see [place] in 6 months. That'll put her beyond her 5 year anniversary and she can be released from our clinic. This was discussed in full with her. In the interim she'll follow-up with her primary physician as scheduled.   She will take the responsibility of scheduling both her mammogram as well as colonoscopy. The importance of the screening tests were discussed with the patient. Marland Kitchen  Kaniel Kiang A., MD 05/26/2017, 9:50 AM

## 2017-05-26 NOTE — Patient Instructions (Signed)
Return to clinic to see [place] in 6 months. That will be your 5 year anniversary and you will be released from our clinic.

## 2017-09-27 ENCOUNTER — Encounter: Payer: Self-pay | Admitting: Gastroenterology

## 2017-10-02 ENCOUNTER — Other Ambulatory Visit: Payer: Self-pay | Admitting: Nurse Practitioner

## 2017-10-13 ENCOUNTER — Encounter: Payer: Self-pay | Admitting: Nurse Practitioner

## 2017-10-13 ENCOUNTER — Ambulatory Visit (INDEPENDENT_AMBULATORY_CARE_PROVIDER_SITE_OTHER): Payer: PPO | Admitting: Nurse Practitioner

## 2017-10-13 VITALS — BP 130/70 | HR 97 | Ht 62.0 in | Wt 218.4 lb

## 2017-10-13 DIAGNOSIS — E7800 Pure hypercholesterolemia, unspecified: Secondary | ICD-10-CM | POA: Diagnosis not present

## 2017-10-13 DIAGNOSIS — E785 Hyperlipidemia, unspecified: Secondary | ICD-10-CM | POA: Diagnosis not present

## 2017-10-13 DIAGNOSIS — I480 Paroxysmal atrial fibrillation: Secondary | ICD-10-CM

## 2017-10-13 LAB — HEPATIC FUNCTION PANEL
ALT: 9 IU/L (ref 0–32)
AST: 14 IU/L (ref 0–40)
Albumin: 4.2 g/dL (ref 3.5–4.8)
Alkaline Phosphatase: 139 IU/L — ABNORMAL HIGH (ref 39–117)
Bilirubin Total: 0.2 mg/dL (ref 0.0–1.2)
Bilirubin, Direct: 0.12 mg/dL (ref 0.00–0.40)
Total Protein: 7.4 g/dL (ref 6.0–8.5)

## 2017-10-13 LAB — BASIC METABOLIC PANEL
BUN/Creatinine Ratio: 17 (ref 12–28)
BUN: 16 mg/dL (ref 8–27)
CO2: 25 mmol/L (ref 20–29)
Calcium: 9.5 mg/dL (ref 8.7–10.3)
Chloride: 100 mmol/L (ref 96–106)
Creatinine, Ser: 0.96 mg/dL (ref 0.57–1.00)
GFR calc Af Amer: 66 mL/min/{1.73_m2} (ref >59)
GFR calc non Af Amer: 57 mL/min/{1.73_m2} — ABNORMAL LOW (ref >59)
Glucose: 107 mg/dL — ABNORMAL HIGH (ref 65–99)
Potassium: 4.7 mmol/L (ref 3.5–5.2)
Sodium: 142 mmol/L (ref 134–144)

## 2017-10-13 LAB — CBC
Hematocrit: 40.8 % (ref 34.0–46.6)
Hemoglobin: 13.9 g/dL (ref 11.1–15.9)
MCH: 30.8 pg (ref 26.6–33.0)
MCHC: 34.1 g/dL (ref 31.5–35.7)
MCV: 91 fL (ref 79–97)
Platelets: 377 10*3/uL (ref 150–379)
RBC: 4.51 x10E6/uL (ref 3.77–5.28)
RDW: 13.9 % (ref 12.3–15.4)
WBC: 11.5 10*3/uL — ABNORMAL HIGH (ref 3.4–10.8)

## 2017-10-13 LAB — LIPID PANEL
Chol/HDL Ratio: 3.8 ratio (ref 0.0–4.4)
Cholesterol, Total: 192 mg/dL (ref 100–199)
HDL: 51 mg/dL (ref >39)
LDL Calculated: 120 mg/dL — ABNORMAL HIGH (ref 0–99)
Triglycerides: 107 mg/dL (ref 0–149)
VLDL Cholesterol Cal: 21 mg/dL (ref 5–40)

## 2017-10-13 LAB — TSH: TSH: 2.65 u[IU]/mL (ref 0.450–4.500)

## 2017-10-13 MED ORDER — APIXABAN 5 MG PO TABS: 5.0000 mg | ORAL_TABLET | Freq: Two times a day (BID) | ORAL | 6 refills | Status: DC

## 2017-10-13 MED ORDER — DILTIAZEM HCL ER COATED BEADS 240 MG PO CP24: 240.0000 mg | ORAL_CAPSULE | Freq: Every day | ORAL | 3 refills | Status: DC

## 2017-10-13 NOTE — Patient Instructions (Addendum)
We will be checking the following labs today - BMET, CBC, HPF, Lipids and TSH   Medication Instructions:    Continue with your current medicines. BUT  I am adding Eliquis 5 mg to take twice a day - this is to protect you from having a stroke  I am increasing your Diltiazem to 240 mg a day   These are both at your drug store today. We will try to give you samples of Eliquis    Testing/Procedures To Be Arranged:  Echocardiogrm  Follow-Up:   See me in 4 to 6 weeks with EKG and lab - do not need to fast   Other Special Instructions:   N/A    If you need a refill on your cardiac medications before your next appointment, please call your pharmacy.   Call the [place] office at [phone] if you have any questions, problems or concerns.

## 2017-10-13 NOTE — Progress Notes (Signed)
CARDIOLOGY OFFICE NOTE  Date:  10/13/2017    [name] Date of Birth: Apr 20, 1941 Medical Record #[id number]  PCP:  Katherina Mires, MD  Cardiologist:  Servando Snare Nahser    Chief Complaint  Patient presents with  . Atrial Fibrillation    1 year check - seen for Dr. Acie Fredrickson.     History of Present Illness: Jill Cooke is a 76 y.o. female who presents today for a one year check. Seen for Dr. Acie Fredrickson.  She has had PAF back in 2013 following a D&C for vaginal bleeding. Converted with IV diltiazem. Echo with normal EF and grade 2 diastolic dysfunction and mild LVH at that time. No other cardiac history. She is obese.   I last saw her in November of 2017 and she was doing ok. Rhythm ok. Struggling with weight loss.   Comes in today. Here alone. Says she is doing pretty good. Has had her 55th college reunion and was trying to work on her weight but was not successful.  No chest pain. Not short of breath. Energy is ok. Has been bothered more by allergies. She denies any palpitations, dizziness, syncope.    Past Medical History:  Diagnosis Date  . Anxiety    with  diagnosis  . Cancer ([place])    ENDOMETRIAL  . Endometrial hyperplasia   . PAF (paroxysmal atrial fibrillation) ([place])    a. 06/2012 Admitted with asymp afib->converted on IV dilt.  No asa/coumadin 2/2 h/o vaginal bleeding pending d/c;  b. 07/20/2012 Echo:  EF 60-65%, Gr2 DD, mild LVH   . Postmenopausal bleeding   . Retroversion of uterus   . Vaginal bleeding   . Vulvar ulceration     History reviewed. No pertinent surgical history.   Medications: Current Meds  Medication Sig  . Omega-3 Fatty Acids (FISH OIL) 1000 MG CAPS Take 1 capsule by mouth daily. Reported on 04/15/2016  . [DISCONTINUED] aspirin 81 MG tablet Take 81 mg by mouth daily.  . [DISCONTINUED] diltiazem (CARDIZEM CD) 180 MG 24 hr capsule Take 1 capsule (180 mg total) by mouth daily.     Allergies: No Known Allergies  Social History: The patient   reports that  has never smoked. she has never used smokeless tobacco. She reports that she does not drink alcohol or use drugs.   Family History: The patient's family history is not on file.   Review of Systems: Please see the history of present illness.   Otherwise, the review of systems is positive for none.   All other systems are reviewed and negative.   Physical Exam: VS:  BP 130/70 (BP Location: Left Arm, Patient Position: Sitting, Cuff Size: Large)   Pulse 97   Ht 5\' 2"  (1.575 m)   Wt 218 lb 6.4 oz (99.1 kg)   BMI 39.95 kg/m  .  BMI Body mass index is 39.95 kg/m.  Wt Readings from Last 3 Encounters:  10/13/17 218 lb 6.4 oz (99.1 kg)  05/26/17 214 lb 9.6 oz (97.3 kg)  11/18/16 218 lb 9.6 oz (99.2 kg)    General: Pleasant. Obese female who is alert and in no acute distress.   HEENT: Normal.  Neck: Supple, no JVD, carotid bruits, or masses noted.  Cardiac: Irregular irregular rhythm. Rate is a little fast.  No murmurs, rubs, or gallops. No edema.  Respiratory:  Lungs are clear to auscultation bilaterally with normal work of breathing.  GI: Soft and nontender.  MS: No deformity  or atrophy. Gait and ROM intact.  Skin: Warm and dry. Color is normal.  Neuro:  Strength and sensation are intact and no gross focal deficits noted.  Psych: Alert, appropriate and with normal affect.   LABORATORY DATA:  EKG:  EKG is ordered today. This demonstrates atrial fib with a VR of 97 today.   Lab Results  Component Value Date   WBC 10.1 10/13/2016   HGB 13.4 10/13/2016   HCT 39.8 10/13/2016   PLT 386 10/13/2016   GLUCOSE 102 (H) 10/13/2016   CHOL 187 10/13/2016   TRIG 130 10/13/2016   HDL 44 (L) 10/13/2016   LDLCALC 117 (H) 10/13/2016   ALT 15 10/13/2016   AST 15 10/13/2016   NA 140 10/13/2016   K 4.2 10/13/2016   CL 101 10/13/2016   CREATININE 1.02 (H) 10/13/2016   BUN 20 10/13/2016   CO2 29 10/13/2016   TSH 2.57 10/13/2016   INR 1.07 07/19/2012    BNP (last 3  results) No results for input(s): BNP in the last 8760 hours.  ProBNP (last 3 results) No results for input(s): PROBNP in the last 8760 hours.   Other Studies Reviewed Today:  Echo Study Conclusions from 2013  - Left ventricle: The cavity size was normal. Wall thickness was increased in a pattern of mild LVH. Systolic function was normal. The estimated ejection fraction was in the range of 60% to 65%. Wall motion was normal; there were no regional wall motion abnormalities. Features are consistent with a pseudonormal left ventricular filling pattern, with concomitant abnormal relaxation and increased filling pressure (grade 2 diastolic dysfunction). - Aortic valve: There was no stenosis. - Mitral valve: Trivial regurgitation. - Left atrium: The atrium was mildly dilated. - Right ventricle: The cavity size was normal. Systolic function was normal. - Tricuspid valve: Peak RV-RA gradient: 10mm Hg (S). - Pulmonary arteries: PA peak pressure: 51mm Hg (S). - Inferior vena cava: The vessel was normal in size; the respirophasic diameter changes were in the normal range (= 50%); findings are consistent with normal central venous pressure. Impressions:  - Normal LV size with mild LV hypertrophy. EF 60-65%. Moderate diastolic dysfunction. Normal RV size and systolic function. No significant valvular dysfunction.  Assessment/Plan: 1. Persistent AF - she has reverted to AF - duration is unknown. She seems to be totally asymptomatic to me. She is not aware today that she is in AF. Reviewed risk factors for AF as well as treatment plan. Adding Eliquis 5 mg BID today - samples given. Stopping aspirin. Advised avoiding NSAIDs and to use Tylenol instead for pain/discomforts. Advised of bleeding risk as well. HR is not controlled and I will increase her CCB today. Update her echo. Lab today. See back to discuss possible cardioversion versus just managing with rate  control and anticoagulation.   2. Obesity - knows she needs to get back on track with walking/diet/etc. Needs to really make her health a priority.   3. DM - followed by PCP   4. HLD - checking labs today.    Current medicines are reviewed with the patient today.  The patient does not have concerns regarding medicines other than what has been noted above.  The following changes have been made:  See above.  Labs/ tests ordered today include:    Orders Placed This Encounter  Procedures  . Basic metabolic panel  . CBC  . Hepatic function panel  . Lipid panel  . TSH  . EKG 12-Lead  . ECHOCARDIOGRAM  COMPLETE     Disposition:   FU with me in 4 to 6 weeks with EKG and labs.   Patient is agreeable to this plan and will call if any problems develop in the interim.   SignedTruitt Merle, NP  10/13/2017 11:03 AM  [facility] [address] [place], [place]  [address] Phone: [phone] Fax: [phone]

## 2017-10-20 ENCOUNTER — Other Ambulatory Visit: Payer: Self-pay

## 2017-10-20 ENCOUNTER — Ambulatory Visit (HOSPITAL_COMMUNITY): Payer: PPO | Attending: Internal Medicine

## 2017-10-20 DIAGNOSIS — E119 Type 2 diabetes mellitus without complications: Secondary | ICD-10-CM | POA: Diagnosis not present

## 2017-10-20 DIAGNOSIS — I361 Nonrheumatic tricuspid (valve) insufficiency: Secondary | ICD-10-CM | POA: Diagnosis not present

## 2017-10-20 DIAGNOSIS — I480 Paroxysmal atrial fibrillation: Secondary | ICD-10-CM

## 2017-11-09 NOTE — Progress Notes (Deleted)
Consult Note: Gyn-Onc  Jill Cooke 76 y.o. female  CC:  No chief complaint on file.   HPI: 76 y/o Jill Cooke at 76 years old. Patient noted clear vaginal discharge that progressed to bleeding the following month. Seen by Dr. Clearence Cheek in 01/2012 and atrophic vaginitis suspected. Vaginal bleeding continued and she was referred to Dr. [name], Treated with (?) Megace and antibiotics with out improvement. Pelvic U/S 04/2012 c/w 2cm endometrial stripe and retroverted uterus. EMB unsuccesful. She was taken to the OR by Dr. [name] on 06/24/2012. D&C hysteroscopy was not feasible because of severe cervical stenosis noted.    Patient was administered cytotec vaginally and underwent uterine dilatation and curettage under  anesthesia 08/09/2012. Operative findings: Cervix enlarged to 5 cm. No gross lesions, no cul de sac nodularity. Unable to assess adnexae because of the body habitus. Uterus sounded to 10cm. Uterine prolapse to almost the introitus  Pathology  1. Endocervix, curettage  - SCANT BENIGN ENDOCERVIX AND BENIGN SQUAMOUS FRAGMENTS.  2. Endometrium, curettage  - ADENOCARCINOMA WITH EXTENSIVE SQUAMOUS DIFFERENTIATION.  Microscopic Comment  There is mucus and fragments of adenocarcinoma with extensive squamous differentiation. There are focal areas with solid growth pattern and the features favor FIGO grade 2.  She underwent a total robotic hysterectomy bilateral salpingo-oophorectomy and pelvic lymph node dissection on September 13, 2012. Operative findings included to 10 week size uterus. Frozen section revealed a grade 2 lesion with less than 50% myometrial invasion. There was significant intra-abdominal obesity and visceral fat.  Pathology revealed a grade 2 endometrioid adenocarcinoma with 0.9 out of 2.0 cm of myometrial invasion. There was no lymphovascular space involvement. The cervix, uterus otherwise, and bilateral adnexa were negative. 0/9 lymph nodes were involved. Her final stage was a stage IA  grade 2 endometrioid adenocarcinoma.   Interval History: I last saw her June 2018. At that time her exam was normal and her pap smear were unremarkable in 12/17. Her primary care physician is Dr. [name].   She's overall doing fairly well since we last saw her.   Review of Systems  Constitutional: Weight stable Skin: No rash Cardiovascular: No chest pain, slight shortness of breath with activity, + edema  Pulmonary: No cough or wheeze.  Gastro Intestinal:  No nausea, vomiting, constipation, or diarrhea reported. Genitourinary: No frequency, urgency, or dysuria.  Denies vaginal bleeding and discharge.  Musculoskeletal: Occasional right knee pain Neurologic: No weakness, numbness, or change in gait.  Psychology: No changes  Current Meds:  Outpatient Encounter Medications as of 11/10/2017  Medication Sig  . apixaban (ELIQUIS) 5 MG TABS tablet Take 1 tablet (5 mg total) 2 (two) times daily by mouth.  . diltiazem (CARDIZEM CD) 240 MG 24 hr capsule Take 1 capsule (240 mg total) daily by mouth.  . Omega-3 Fatty Acids (FISH OIL) 1000 MG CAPS Take 1 capsule by mouth daily. Reported on 04/15/2016   No facility-administered encounter medications on file as of 11/10/2017.     Allergy: No Known Allergies  Social Hx:   Social History   Socioeconomic History  . Marital status: Married    Spouse name: Not on file  . Number of children: Not on file  . Years of education: Not on file  . Highest education level: Not on file  Social Needs  . Financial resource strain: Not on file  . Food insecurity - worry: Not on file  . Food insecurity - inability: Not on file  . Transportation needs - medical: Not on file  .  Transportation needs - non-medical: Not on file  Occupational History  . Not on file  Tobacco Use  . Smoking status: Never Smoker  . Smokeless tobacco: Never Used  Substance and Sexual Activity  . Alcohol use: No    Comment: ocassional wine or beer   2 x month  . Drug use: No   . Sexual activity: No  Other Topics Concern  . Not on file  Social History Narrative  . Not on file    Past Surgical Hx:  Past Surgical History:  Procedure Laterality Date  . DILATION AND CURETTAGE OF UTERUS  08/09/2012   Procedure: DILATATION AND CURETTAGE;  Surgeon: Janie Morning, MD PHD;  Location: [facility];  Service: Gynecology;  Laterality: N/A;  . LYMPH NODE DISSECTION  09/13/2012   Procedure: LYMPH NODE DISSECTION;  Surgeon: Imagene Gurney A. Alycia Rossetti, MD;  Location: [facility];  Service: Gynecology;  Laterality: Bilateral;  bilateral pelvic lymph node dissection  . ROBOTIC ASSISTED LAP VAGINAL HYSTERECTOMY  09/13/2012   Procedure: ROBOTIC ASSISTED LAPAROSCOPIC VAGINAL HYSTERECTOMY;  Surgeon: Imagene Gurney A. Alycia Rossetti, MD;  Location: [facility];  Service: Gynecology;  Laterality: N/A;    Past Medical Hx:  Past Medical History:  Diagnosis Date  . Anxiety    with  diagnosis  . Cancer ([place])    ENDOMETRIAL  . Endometrial hyperplasia   . PAF (paroxysmal atrial fibrillation) ([place])    a. 06/2012 Admitted with asymp afib->converted on IV dilt.  No asa/coumadin 2/2 h/o vaginal bleeding pending d/c;  b. 07/20/2012 Echo:  EF 60-65%, Gr2 DD, mild LVH   . Postmenopausal bleeding   . Retroversion of uterus   . Vaginal bleeding   . Vulvar ulceration     Family Hx:  No family history on file.  Vitals:  There were no vitals taken for this visit.  Physical Exam:  Well-nourished well-developed female in no acute distress.  Neck: Supple, no lymphadenopathy, no thyromegaly.  Lungs: Clear to auscultation bilateral.  Cardiovascular: Regular rate and rhythm  Abdomen: Well-healed surgical incisions. No appreciable incisional hernias were identified.  Abdomen is soft and nontender. No palpable masses, no hepatosplenomegaly. Exam is limited by habitus, morbidly obese.  Groins: No lymphadenopathy though exam is limited by habitus.  Pelvic: Normal external female genitalia. The vaginal cuff has no visible lesions.  Pap smear submitted without difficulty. Bimanual examination reveals no masses or nodularity. Rectal confirms  Extremities: 1+ edema  Assessment/Plan: 75 year old with a stage IA grade 2 endometrioid adenocarcinoma who did not meet GOG 99 criteria for vaginal cuff brachytherapy. She is s/p definitive surgery 12/2011 and will be released from our clinic.  We will notify her of the results of her pap smear from today.  She will take the responsibility of scheduling both her mammogram as well as colonoscopy. The importance of the screening tests were discussed with the patient. Marland Kitchen  Nancy Marus A., MD 11/09/2017, 6:56 PM

## 2017-11-10 ENCOUNTER — Ambulatory Visit: Payer: PPO | Admitting: Gynecologic Oncology

## 2017-11-17 ENCOUNTER — Ambulatory Visit (INDEPENDENT_AMBULATORY_CARE_PROVIDER_SITE_OTHER): Payer: PPO | Admitting: Nurse Practitioner

## 2017-11-17 ENCOUNTER — Encounter: Payer: Self-pay | Admitting: Nurse Practitioner

## 2017-11-17 VITALS — BP 154/70 | HR 72 | Ht 62.0 in | Wt 216.8 lb

## 2017-11-17 DIAGNOSIS — E7800 Pure hypercholesterolemia, unspecified: Secondary | ICD-10-CM

## 2017-11-17 DIAGNOSIS — Z7901 Long term (current) use of anticoagulants: Secondary | ICD-10-CM | POA: Diagnosis not present

## 2017-11-17 DIAGNOSIS — I480 Paroxysmal atrial fibrillation: Secondary | ICD-10-CM | POA: Diagnosis not present

## 2017-11-17 DIAGNOSIS — I10 Essential (primary) hypertension: Secondary | ICD-10-CM | POA: Diagnosis not present

## 2017-11-17 NOTE — Patient Instructions (Addendum)
We will be checking the following labs today - BMET and CBC   Medication Instructions:    Continue with your current medicines.     Testing/Procedures To Be Arranged:  N/A  Follow-Up:   See me in 4 months with EKG    Other Special Instructions:   You are back in a normal rhythm today - we do not need to arrange a cardioversion (shocking of your heart).     If you need a refill on your cardiac medications before your next appointment, please call your pharmacy.   Call the [place] office at [phone] if you have any questions, problems or concerns.

## 2017-11-17 NOTE — Progress Notes (Signed)
CARDIOLOGY OFFICE NOTE  Date:  11/17/2017    Jill Cooke Date of Birth: June 09, 1941 Medical Record #[id number]  PCP:  Katherina Mires, MD  Cardiologist:  Servando Snare Nahser    Chief Complaint  Patient presents with  . Atrial Fibrillation    Follow up visit - seen for Dr. Acie Fredrickson    History of Present Illness: Jill Cooke is a 76 y.o. female who presents today for a one month check. Seen for Dr. Acie Fredrickson.  She has had PAF back in 2013 following a D&C for vaginal bleeding. Converted with IV diltiazem. Echo with normal EF and grade 2 diastolic dysfunction and mild LVH at that time. No other cardiac history. She is obese.   I saw her in November of 2017and she was doing ok. Rhythm ok. Struggling with weight loss.   Seen last month for her annual check - she was feeling good but was in AF - anticoagulation was started. Echo updated. To consider cardioversion on return.   Comes in today. Here alone.She notes lots of stress with the holidays. Otherwise feels ok. No bleeding or bruising. Feels "cold" with the Eliqus but otherwise, she is doing ok. Has seen the dentist for a cleaning. Probably getting colonoscopy sometime in 2019 - this should be ok. Echo reviewed - basically unchanged from prior study of 2013. BP has been good at home - in the 509'T systolic typcally.   Past Medical History:  Diagnosis Date  . Anxiety    with  diagnosis  . Cancer ([place])    ENDOMETRIAL  . Endometrial hyperplasia   . PAF (paroxysmal atrial fibrillation) ([place])    a. 06/2012 Admitted with asymp afib->converted on IV dilt.  No asa/coumadin 2/2 h/o vaginal bleeding pending d/c;  b. 07/20/2012 Echo:  EF 60-65%, Gr2 DD, mild LVH   . Postmenopausal bleeding   . Retroversion of uterus   . Vaginal bleeding   . Vulvar ulceration     Past Surgical History:  Procedure Laterality Date  . DILATION AND CURETTAGE OF UTERUS  08/09/2012   Procedure: DILATATION AND CURETTAGE;  Surgeon: Janie Morning, MD PHD;   Location: [facility];  Service: Gynecology;  Laterality: N/A;  . LYMPH NODE DISSECTION  09/13/2012   Procedure: LYMPH NODE DISSECTION;  Surgeon: Imagene Gurney A. Alycia Rossetti, MD;  Location: [facility];  Service: Gynecology;  Laterality: Bilateral;  bilateral pelvic lymph node dissection  . ROBOTIC ASSISTED LAP VAGINAL HYSTERECTOMY  09/13/2012   Procedure: ROBOTIC ASSISTED LAPAROSCOPIC VAGINAL HYSTERECTOMY;  Surgeon: Imagene Gurney A. Alycia Rossetti, MD;  [facility]: [facility];  Service: Gynecology;  Laterality: N/A;     Medications: Current Meds  Medication Sig  . apixaban (ELIQUIS) 5 MG TABS tablet Take 1 tablet (5 mg total) 2 (two) times daily by mouth.  . diltiazem (CARDIZEM CD) 240 MG 24 hr capsule Take 1 capsule (240 mg total) daily by mouth.     Allergies: No Known Allergies  Social History: The patient  reports that  has never smoked. she has never used smokeless tobacco. She reports that she does not drink alcohol or use drugs.   Family History: The patient's family history is not on file.   Review of Systems: Please see the history of present illness.   Otherwise, the review of systems is positive for none.   All other systems are reviewed and negative.   Physical Exam: VS:  BP (!) 154/70   Pulse 72   Ht 5\' 2"  (1.575 m)  Wt 216 lb 12.8 oz (98.3 kg)   SpO2 96%   BMI 39.65 kg/m  .  BMI Body mass index is 39.65 kg/m.  Wt Readings from Last 3 Encounters:  11/17/17 216 lb 12.8 oz (98.3 kg)  10/13/17 218 lb 6.4 oz (99.1 kg)  05/26/17 214 lb 9.6 oz (97.3 kg)   Repeat BP by me is 130/80.  General: Pleasant. Well developed, well nourished and in no acute distress.   HEENT: Normal.  Neck: Supple, no JVD, carotid bruits, or masses noted.  Cardiac: Regular rate and rhythm. No murmurs, rubs, or gallops. No edema.  Respiratory:  Lungs are clear to auscultation bilaterally with normal work of breathing.  GI: Soft and nontender.  MS: No deformity or atrophy. Gait and ROM intact.  Skin: Warm and dry. Color is  normal.  Neuro:  Strength and sensation are intact and no gross focal deficits noted.  Psych: Alert, appropriate and with normal affect.   LABORATORY DATA:  EKG:  EKG is ordered today. This demonstrates NSR today.  Lab Results  Component Value Date   WBC 11.5 (H) 10/13/2017   HGB 13.9 10/13/2017   HCT 40.8 10/13/2017   PLT 377 10/13/2017   GLUCOSE 107 (H) 10/13/2017   CHOL 192 10/13/2017   TRIG 107 10/13/2017   HDL 51 10/13/2017   LDLCALC 120 (H) 10/13/2017   ALT 9 10/13/2017   AST 14 10/13/2017   NA 142 10/13/2017   K 4.7 10/13/2017   CL 100 10/13/2017   CREATININE 0.96 10/13/2017   BUN 16 10/13/2017   CO2 25 10/13/2017   TSH 2.650 10/13/2017   INR 1.07 07/19/2012     BNP (last 3 results) No results for input(s): BNP in the last 8760 hours.  ProBNP (last 3 results) No results for input(s): PROBNP in the last 8760 hours.   Other Studies Reviewed Today:  Echo Study Conclusions 09/2017  - Left ventricle: The cavity size was normal. Wall thickness was   increased in a pattern of mild LVH. Systolic function was normal.   The estimated ejection fraction was in the range of 60% to 65%.   Wall motion was normal; there were no regional wall motion   abnormalities. Doppler parameters are consistent with   pseudonormal left ventricular relaxation (grade 2 diastolic   dysfunction). The E/e&' ratio is >15, suggesting elevated LV   filling pressure. - Mitral valve: Mildly thickened leaflets . There was trivial   regurgitation. - Left atrium: The atrium was normal in size. - Tricuspid valve: There was mild regurgitation. - Pulmonary arteries: PA peak pressure: 48 mm Hg (S). - Inferior vena cava: The vessel was normal in size. The   respirophasic diameter changes were in the normal range (>= 50%),   consistent with normal central venous pressure.  Impressions:  - Compared to a prior study in 2013, there are no significant   changes.  Assessment/Plan:  1. PAF -  she has been totally asymptomatic - she is now on Eliquis. No need for cardioversion. Would manage with her current dose of CCB and continued anticoagulation.   2. Obesity - knows she needs to get back on track with walking/diet/etc. Needs to really make her health a priority. We have discussed this again today. Reminded her that this is a risk factor for AF.   3. DM - followed by PCP   4. HLD - last lipids noted.   5. Chronic anticoagulation - no problems noted - repeat lab today.  6. Elevated BP - recheck is ok. She typically has good control at home.   Current medicines are reviewed with the patient today.  The patient does not have concerns regarding medicines other than what has been noted above.  The following changes have been made:  See above.  Labs/ tests ordered today include:    Orders Placed This Encounter  Procedures  . Basic metabolic panel  . CBC  . EKG 12-Lead     Disposition:   FU with me in 4 months with EKG.   Patient is agreeable to this plan and will call if any problems develop in the interim.   SignedTruitt Merle, NP  11/17/2017 10:37 AM  [facility] [address] [place], [place]  [address] Phone: [phone] Fax: [phone]

## 2017-11-18 LAB — BASIC METABOLIC PANEL
BUN/Creatinine Ratio: 16 (ref 12–28)
BUN: 16 mg/dL (ref 8–27)
CO2: 25 mmol/L (ref 20–29)
Calcium: 9.2 mg/dL (ref 8.7–10.3)
Chloride: 100 mmol/L (ref 96–106)
Creatinine, Ser: 1 mg/dL (ref 0.57–1.00)
GFR calc Af Amer: 63 mL/min/{1.73_m2} (ref >59)
GFR calc non Af Amer: 54 mL/min/{1.73_m2} — ABNORMAL LOW (ref >59)
Glucose: 101 mg/dL — ABNORMAL HIGH (ref 65–99)
Potassium: 4.3 mmol/L (ref 3.5–5.2)
Sodium: 139 mmol/L (ref 134–144)

## 2017-11-18 LAB — CBC
Hematocrit: 41.4 % (ref 34.0–46.6)
Hemoglobin: 14 g/dL (ref 11.1–15.9)
MCH: 30.8 pg (ref 26.6–33.0)
MCHC: 33.8 g/dL (ref 31.5–35.7)
MCV: 91 fL (ref 79–97)
Platelets: 359 10*3/uL (ref 150–379)
RBC: 4.55 x10E6/uL (ref 3.77–5.28)
RDW: 13.6 % (ref 12.3–15.4)
WBC: 11.1 10*3/uL — ABNORMAL HIGH (ref 3.4–10.8)

## 2018-03-23 ENCOUNTER — Ambulatory Visit (INDEPENDENT_AMBULATORY_CARE_PROVIDER_SITE_OTHER): Payer: PPO | Admitting: Nurse Practitioner

## 2018-03-23 ENCOUNTER — Encounter: Payer: Self-pay | Admitting: Nurse Practitioner

## 2018-03-23 VITALS — BP 140/74 | HR 84 | Ht 62.0 in | Wt 213.8 lb

## 2018-03-23 DIAGNOSIS — Z7901 Long term (current) use of anticoagulants: Secondary | ICD-10-CM | POA: Diagnosis not present

## 2018-03-23 DIAGNOSIS — E7800 Pure hypercholesterolemia, unspecified: Secondary | ICD-10-CM | POA: Diagnosis not present

## 2018-03-23 DIAGNOSIS — I4819 Other persistent atrial fibrillation: Secondary | ICD-10-CM

## 2018-03-23 DIAGNOSIS — I481 Persistent atrial fibrillation: Secondary | ICD-10-CM | POA: Diagnosis not present

## 2018-03-23 DIAGNOSIS — I10 Essential (primary) hypertension: Secondary | ICD-10-CM

## 2018-03-23 LAB — BASIC METABOLIC PANEL
BUN/Creatinine Ratio: 18 (ref 12–28)
BUN: 18 mg/dL (ref 8–27)
CO2: 27 mmol/L (ref 20–29)
Calcium: 9 mg/dL (ref 8.7–10.3)
Chloride: 101 mmol/L (ref 96–106)
Creatinine, Ser: 1 mg/dL (ref 0.57–1.00)
GFR calc Af Amer: 63 mL/min/{1.73_m2} (ref >59)
GFR calc non Af Amer: 54 mL/min/{1.73_m2} — ABNORMAL LOW (ref >59)
Glucose: 116 mg/dL — ABNORMAL HIGH (ref 65–99)
Potassium: 4 mmol/L (ref 3.5–5.2)
Sodium: 141 mmol/L (ref 134–144)

## 2018-03-23 LAB — CBC
Hematocrit: 41.2 % (ref 34.0–46.6)
Hemoglobin: 14 g/dL (ref 11.1–15.9)
MCH: 30.4 pg (ref 26.6–33.0)
MCHC: 34 g/dL (ref 31.5–35.7)
MCV: 89 fL (ref 79–97)
Platelets: 335 10*3/uL (ref 150–379)
RBC: 4.61 x10E6/uL (ref 3.77–5.28)
RDW: 14 % (ref 12.3–15.4)
WBC: 8.3 10*3/uL (ref 3.4–10.8)

## 2018-03-23 MED ORDER — APIXABAN 5 MG PO TABS: 5.0000 mg | ORAL_TABLET | Freq: Two times a day (BID) | ORAL | 3 refills | Status: DC

## 2018-03-23 NOTE — Progress Notes (Signed)
CARDIOLOGY OFFICE NOTE  Date:  03/23/2018    Jill Cooke Date of Birth: Dec 24, 1940 Medical Record #[id number]  PCP:  Katherina Mires, MD  Cardiologist:  Servando Snare Nahser  Chief Complaint  Patient presents with  . Atrial Fibrillation    4 month check - seen for Dr. Acie Fredrickson    History of Present Illness: Jill Cooke is a 77 y.o. female who presents today for a follow up visit. Seen for Dr. Acie Fredrickson.  She has had PAF back in 2013 following a D&C for vaginal bleeding. Converted with IV diltiazem. Echo with normal EF and grade 2 diastolic dysfunction and mild LVH at that time. No other cardiac history. She is obese.   I saw her in November of 2017and she was doing ok.Rhythm ok. Struggling with weight loss.  Seen back in November for her annual check - she was feeling good but was in AF - she was not aware nor was she symptomatic -  anticoagulation was started. Echo updated. To consider cardioversion on return.  Last seen in December - did not wish to have cardioversion and opted for rate control and anticoagulation.   Comes in today. Here alone.She feels well. No chest pain. Not short of breath. No palpitations. Energy level is fine. Husband has had to go on a diabetic diet - she is "following suit". Has lost a few pounds. Not interested in cardioversion or other therapies for her AF/flutter. No problems with the Eliquis.   Past Medical History:  Diagnosis Date  . Anxiety    with  diagnosis  . Cancer ([place])    ENDOMETRIAL  . Endometrial hyperplasia   . PAF (paroxysmal atrial fibrillation) ([place])    a. 06/2012 Admitted with asymp afib->converted on IV dilt.  No asa/coumadin 2/2 h/o vaginal bleeding pending d/c;  b. 07/20/2012 Echo:  EF 60-65%, Gr2 DD, mild LVH   . Postmenopausal bleeding   . Retroversion of uterus   . Vaginal bleeding   . Vulvar ulceration     Past Surgical History:  Procedure Laterality Date  . DILATION AND CURETTAGE OF UTERUS  08/09/2012   Procedure:  DILATATION AND CURETTAGE;  Surgeon: Janie Morning, MD PHD;  [facility];  Service: Gynecology;  Laterality: N/A;  . LYMPH NODE DISSECTION  09/13/2012   Procedure: LYMPH NODE DISSECTION;  Surgeon: Imagene Gurney A. Alycia Rossetti, MD;  [facility];  Service: Gynecology;  Laterality: Bilateral;  bilateral pelvic lymph node dissection  . ROBOTIC ASSISTED LAP VAGINAL HYSTERECTOMY  09/13/2012   Procedure: ROBOTIC ASSISTED LAPAROSCOPIC VAGINAL HYSTERECTOMY;  Surgeon: Imagene Gurney A. Alycia Rossetti, MD;  [facility];  Service: Gynecology;  Laterality: N/A;     Medications: Current Meds  Medication Sig  . apixaban (ELIQUIS) 5 MG TABS tablet Take 1 tablet (5 mg total) by mouth 2 (two) times daily.  Marland Kitchen diltiazem (CARDIZEM CD) 240 MG 24 hr capsule Take 1 capsule (240 mg total) daily by mouth.  . [DISCONTINUED] apixaban (ELIQUIS) 5 MG TABS tablet Take 1 tablet (5 mg total) 2 (two) times daily by mouth.     Allergies: No Known Allergies  Social History: The patient  reports that she has never smoked. She has never used smokeless tobacco. She reports that she does not drink alcohol or use drugs.   Family History: The patient's family history is not on file.   Review of Systems: Please see the history of present illness.   Otherwise, the review of systems is positive for none.  All other systems are reviewed and negative.   Physical Exam: VS:  BP 140/74 (BP Location: Left Arm, Patient Position: Sitting, Cuff Size: Large)   Pulse 84   Ht 5\' 2"  (1.575 m)   Wt 213 lb 12.8 oz (97 kg)   BMI 39.10 kg/m  .  BMI Body mass index is 39.1 kg/m.  Wt Readings from Last 3 Encounters:  03/23/18 213 lb 12.8 oz (97 kg)  11/17/17 216 lb 12.8 oz (98.3 kg)  10/13/17 218 lb 6.4 oz (99.1 kg)    General: Pleasant. Well developed, well nourished and in no acute distress. She is down 3 pounds.   HEENT: Normal.  Neck: Supple, no JVD, carotid bruits, or masses noted.  Cardiac: Irregular irregular rhythm. Rate is ok. No  murmurs, rubs, or gallops. No edema.  Respiratory:  Lungs are clear to auscultation bilaterally with normal work of breathing.  GI: Soft and nontender.  MS: No deformity or atrophy. Gait and ROM intact.  Skin: Warm and dry. Color is normal.  Neuro:  Strength and sensation are intact and no gross focal deficits noted.  Psych: Alert, appropriate and with normal affect.   LABORATORY DATA:  EKG:  EKG is ordered today. This demonstrates AFL with controlled VR.  Lab Results  Component Value Date   WBC 11.1 (H) 11/17/2017   HGB 14.0 11/17/2017   HCT 41.4 11/17/2017   PLT 359 11/17/2017   GLUCOSE 101 (H) 11/17/2017   CHOL 192 10/13/2017   TRIG 107 10/13/2017   HDL 51 10/13/2017   LDLCALC 120 (H) 10/13/2017   ALT 9 10/13/2017   AST 14 10/13/2017   NA 139 11/17/2017   K 4.3 11/17/2017   CL 100 11/17/2017   CREATININE 1.00 11/17/2017   BUN 16 11/17/2017   CO2 25 11/17/2017   TSH 2.650 10/13/2017   INR 1.07 07/19/2012     BNP (last 3 results) No results for input(s): BNP in the last 8760 hours.  ProBNP (last 3 results) No results for input(s): PROBNP in the last 8760 hours.   Other Studies Reviewed Today:  Echo Study Conclusions 09/2017  - Left ventricle: The cavity size was normal. Wall thickness was increased in a pattern of mild LVH. Systolic function was normal. The estimated ejection fraction was in the range of 60% to 65%. Wall motion was normal; there were no regional wall motion abnormalities. Doppler parameters are consistent with pseudonormal left ventricular relaxation (grade 2 diastolic dysfunction). The E/e&' ratio is >15, suggesting elevated LV filling pressure. - Mitral valve: Mildly thickened leaflets . There was trivial regurgitation. - Left atrium: The atrium was normal in size. - Tricuspid valve: There was mild regurgitation. - Pulmonary arteries: PA peak pressure: 48 mm Hg (S). - Inferior vena cava: The vessel was normal in size.  The respirophasic diameter changes were in the normal range (>= 50%), consistent with normal central venous pressure.  Impressions:  - Compared to a prior study in 2013, there are no significant changes.  Assessment/Plan:  1.PAF/flutter - she has been totally asymptomatic - she has elected to stay with rate control and anticoagulation. EF is normal. Surveillance lab today.   2. Obesity - now doing more of a diabetic diet with her husband. Has lost a few pounds. She is encouraged to keep up the good work.   3. DM - followed by PCP   4. HLD - last lipids noted from November.   5. Chronic anticoagulation - no problems noted - getting  repeat lab today.   6. Elevated BP - BP lower at home. No changes made today. Will follow.  Current medicines are reviewed with the patient today.  The patient does not have concerns regarding medicines other than what has been noted above.  The following changes have been made:  See above.  Labs/ tests ordered today include:    Orders Placed This Encounter  Procedures  . Basic metabolic panel  . CBC  . EKG 12-Lead     Disposition:   FU with me in 6 months.   Patient is agreeable to this plan and will call if any problems develop in the interim.   SignedTruitt Merle, NP  03/23/2018 11:09 AM  [facility] [address] [place], [place]  [address] Phone: [phone] Fax: [phone]

## 2018-03-23 NOTE — Patient Instructions (Addendum)
We will be checking the following labs today - BMET & CBC   Medication Instructions:    Continue with your current medicines.   I did refill your Eliquis today    Testing/Procedures To Be Arranged:  N/A  Follow-Up:   See me in 6 months     Other Special Instructions:   N/A    If you need a refill on your cardiac medications before your next appointment, please call your pharmacy.   Call the [place] office at [phone] if you have any questions, problems or concerns.

## 2018-07-20 ENCOUNTER — Encounter: Payer: Self-pay | Admitting: *Deleted

## 2018-09-09 ENCOUNTER — Encounter: Payer: Self-pay | Admitting: Nurse Practitioner

## 2018-09-21 ENCOUNTER — Ambulatory Visit: Payer: PPO | Admitting: Nurse Practitioner

## 2018-09-27 NOTE — Progress Notes (Signed)
CARDIOLOGY OFFICE NOTE  Date:  09/28/2018    Jill Cooke Date of Birth: [birth year] Medical Record #[id number]  PCP:  Katherina Mires, MD  Cardiologist:  Servando Snare Nahser  Chief Complaint  Patient presents with  . Atrial Fibrillation    6 month check. Seen for Dr. Acie Fredrickson    History of Present Illness: Jill Cooke is a 77 y.o. female who presents today for a 6 month check. Seen for Dr. Acie Fredrickson.  She has had PAF back in 2013 following a D&C for vaginal bleeding. Converted with IV diltiazem. Echo with normal EF and grade 2 diastolic dysfunction and mild LVH at that time. No other cardiac history. She is obese.   I saw her in November of 2017and she was doing ok.Rhythm ok. Struggling with weight loss.She was here for a visit in November of 2018 and was in AF - she was not aware of this. Echo was updated. She did not wish to have cardioversion - she opted for rate control and anticoagulation. Last seen in April by me and she was stable - still struggling with weight loss.   Comes in today. Here alone.Has not had a good summer. Had a death of a good friend this summer and another just this month. She has had issues with her knee. Feels "old and decrepit". No real palpitations that she is aware of. No chest pain. Not dizzy or lightheaded. No passing out. BP typically lower at home - was 121/60 at home. Overall she feels like she is doing ok. She is fasting today.   Past Medical History:  Diagnosis Date  . Anxiety    with  diagnosis  . Cancer ([place])    ENDOMETRIAL  . Endometrial hyperplasia   . PAF (paroxysmal atrial fibrillation) ([place])    a. 06/2012 Admitted with asymp afib->converted on IV dilt.  No asa/coumadin 2/2 h/o vaginal bleeding pending d/c;  b. 07/20/2012 Echo:  EF 60-65%, Gr2 DD, mild LVH   . Postmenopausal bleeding   . Retroversion of uterus   . Vaginal bleeding   . Vulvar ulceration     Past Surgical History:  Procedure Laterality Date  . DILATION AND  CURETTAGE OF UTERUS  08/09/2012   Procedure: DILATATION AND CURETTAGE;  Surgeon: Janie Morning, MD PHD;  [facility];  Service: Gynecology;  Laterality: N/A;  . LYMPH NODE DISSECTION  09/13/2012   Procedure: LYMPH NODE DISSECTION;  Surgeon: Imagene Gurney A. Alycia Rossetti, MD;  [facility];  Service: Gynecology;  Laterality: Bilateral;  bilateral pelvic lymph node dissection  . ROBOTIC ASSISTED LAP VAGINAL HYSTERECTOMY  09/13/2012   Procedure: ROBOTIC ASSISTED LAPAROSCOPIC VAGINAL HYSTERECTOMY;  Surgeon: Imagene Gurney A. Alycia Rossetti, MD;  [facility];  Service: Gynecology;  Laterality: N/A;     Medications: Current Meds  Medication Sig  . apixaban (ELIQUIS) 5 MG TABS tablet Take 1 tablet (5 mg total) by mouth 2 (two) times daily.  Marland Kitchen diltiazem (CARDIZEM CD) 240 MG 24 hr capsule Take 1 capsule (240 mg total) daily by mouth.  . vitamin B-12 (CYANOCOBALAMIN) 1000 MCG tablet Take 1,000 mcg by mouth daily.     Allergies: No Known Allergies  Social History: The patient  reports that she has never smoked. She has never used smokeless tobacco. She reports that she does not drink alcohol or use drugs.   Family History: The patient's family history is not on file.   Review of Systems: Please see the history of present illness.  Otherwise, the review of systems is positive for none.   All other systems are reviewed and negative.   Physical Exam: VS:  BP 140/70 (BP Location: Left Arm, Patient Position: Sitting, Cuff Size: Large)   Pulse 85   Ht 5\' 2"  (1.575 m)   Wt 218 lb 6.4 oz (99.1 kg)   SpO2 97% Comment: at rest  BMI 39.95 kg/m  .  BMI Body mass index is 39.95 kg/m.  Wt Readings from Last 3 Encounters:  09/28/18 218 lb 6.4 oz (99.1 kg)  03/23/18 213 lb 12.8 oz (97 kg)  11/17/17 216 lb 12.8 oz (98.3 kg)    General: Pleasant. Obese. Alert and in no acute distress.   HEENT: Normal.  Neck: Supple, no JVD, carotid bruits, or masses noted.  Cardiac: Irregular irregular rhythm. Heart tones are  distant. Rate is ok.  No edema.  Respiratory:  Lungs are clear to auscultation bilaterally with normal work of breathing.  GI: Soft and nontender.  MS: No deformity or atrophy. Gait and ROM intact.  Skin: Warm and dry. Color is normal.  Neuro:  Strength and sensation are intact and no gross focal deficits noted.  Psych: Alert, appropriate and with normal affect.   LABORATORY DATA:  EKG:  EKG is not ordered today.  Lab Results  Component Value Date   WBC 8.3 03/23/2018   HGB 14.0 03/23/2018   HCT 41.2 03/23/2018   PLT 335 03/23/2018   GLUCOSE 116 (H) 03/23/2018   CHOL 192 10/13/2017   TRIG 107 10/13/2017   HDL 51 10/13/2017   LDLCALC 120 (H) 10/13/2017   ALT 9 10/13/2017   AST 14 10/13/2017   NA 141 03/23/2018   K 4.0 03/23/2018   CL 101 03/23/2018   CREATININE 1.00 03/23/2018   BUN 18 03/23/2018   CO2 27 03/23/2018   TSH 2.650 10/13/2017   INR 1.07 07/19/2012     BNP (last 3 results) No results for input(s): BNP in the last 8760 hours.  ProBNP (last 3 results) No results for input(s): PROBNP in the last 8760 hours.   Other Studies Reviewed Today:  EchoStudy Conclusions11/2018  - Left ventricle: The cavity size was normal. Wall thickness was increased in a pattern of mild LVH. Systolic function was normal. The estimated ejection fraction was in the range of 60% to 65%. Wall motion was normal; there were no regional wall motion abnormalities. Doppler parameters are consistent with pseudonormal left ventricular relaxation (grade 2 diastolic dysfunction). The E/e&' ratio is >15, suggesting elevated LV filling pressure. - Mitral valve: Mildly thickened leaflets . There was trivial regurgitation. - Left atrium: The atrium was normal in size. - Tricuspid valve: There was mild regurgitation. - Pulmonary arteries: PA peak pressure: 48 mm Hg (S). - Inferior vena cava: The vessel was normal in size. The respirophasic diameter changes were in the  normal range (>= 50%), consistent with normal central venous pressure.  Impressions:  - Compared to a prior study in 2013, there are no significant changes.  Assessment/Plan:  1.Persistent AF - she is managed with rate control and anticoagulation. No problems noted. HR is ok. Checking lab today.   2. Obesity - she knows she needs to try and be more active.   3. HLD - lab today - she is not on therapy  4. Chronic anticoagulation - no problems noted - surveillance lab today.   5. Elevated BP - typically always lower at home. Would follow.   Current medicines are reviewed with  the patient today.  The patient does not have concerns regarding medicines other than what has been noted above.  The following changes have been made:  See above.  Labs/ tests ordered today include:    Orders Placed This Encounter  Procedures  . Basic metabolic panel  . CBC  . Hepatic function panel  . Lipid panel     Disposition:   FU with me in 6 months.   Patient is agreeable to this plan and will call if any problems develop in the interim.   SignedTruitt Merle, NP  09/28/2018 11:17 AM  [facility] [address] [place], [place]  [address] Phone: [phone] Fax: [phone]

## 2018-09-28 ENCOUNTER — Ambulatory Visit: Payer: PPO | Admitting: Nurse Practitioner

## 2018-09-28 ENCOUNTER — Encounter: Payer: Self-pay | Admitting: Nurse Practitioner

## 2018-09-28 VITALS — BP 140/70 | HR 85 | Ht 62.0 in | Wt 218.4 lb

## 2018-09-28 DIAGNOSIS — Z7901 Long term (current) use of anticoagulants: Secondary | ICD-10-CM

## 2018-09-28 DIAGNOSIS — E7800 Pure hypercholesterolemia, unspecified: Secondary | ICD-10-CM

## 2018-09-28 DIAGNOSIS — I10 Essential (primary) hypertension: Secondary | ICD-10-CM

## 2018-09-28 DIAGNOSIS — I4819 Other persistent atrial fibrillation: Secondary | ICD-10-CM

## 2018-09-28 LAB — BASIC METABOLIC PANEL
BUN/Creatinine Ratio: 16 (ref 12–28)
BUN: 17 mg/dL (ref 8–27)
CO2: 24 mmol/L (ref 20–29)
Calcium: 9.3 mg/dL (ref 8.7–10.3)
Chloride: 100 mmol/L (ref 96–106)
Creatinine, Ser: 1.06 mg/dL — ABNORMAL HIGH (ref 0.57–1.00)
GFR calc Af Amer: 58 mL/min/{1.73_m2} — ABNORMAL LOW (ref >59)
GFR calc non Af Amer: 50 mL/min/{1.73_m2} — ABNORMAL LOW (ref >59)
Glucose: 101 mg/dL — ABNORMAL HIGH (ref 65–99)
Potassium: 4.1 mmol/L (ref 3.5–5.2)
Sodium: 141 mmol/L (ref 134–144)

## 2018-09-28 LAB — LIPID PANEL
Chol/HDL Ratio: 3.6 ratio (ref 0.0–4.4)
Cholesterol, Total: 173 mg/dL (ref 100–199)
HDL: 48 mg/dL (ref >39)
LDL Calculated: 102 mg/dL — ABNORMAL HIGH (ref 0–99)
Triglycerides: 115 mg/dL (ref 0–149)
VLDL Cholesterol Cal: 23 mg/dL (ref 5–40)

## 2018-09-28 LAB — CBC
Hematocrit: 44 % (ref 34.0–46.6)
Hemoglobin: 14.4 g/dL (ref 11.1–15.9)
MCH: 30.1 pg (ref 26.6–33.0)
MCHC: 32.7 g/dL (ref 31.5–35.7)
MCV: 92 fL (ref 79–97)
Platelets: 372 10*3/uL (ref 150–450)
RBC: 4.78 x10E6/uL (ref 3.77–5.28)
RDW: 13.2 % (ref 12.3–15.4)
WBC: 11.4 10*3/uL — ABNORMAL HIGH (ref 3.4–10.8)

## 2018-09-28 LAB — HEPATIC FUNCTION PANEL
ALT: 12 IU/L (ref 0–32)
AST: 11 IU/L (ref 0–40)
Albumin: 4.1 g/dL (ref 3.5–4.8)
Alkaline Phosphatase: 125 IU/L — ABNORMAL HIGH (ref 39–117)
Bilirubin Total: 0.4 mg/dL (ref 0.0–1.2)
Bilirubin, Direct: 0.11 mg/dL (ref 0.00–0.40)
Total Protein: 7.2 g/dL (ref 6.0–8.5)

## 2018-09-28 NOTE — Patient Instructions (Addendum)
We will be checking the following labs today - BMET, CBC, HPF and lipids  If you have labs (blood work) drawn today and your tests are completely normal, you will receive your results only by: Marland Kitchen MyChart Message (if you have MyChart) OR . A paper copy in the mail If you have any lab test that is abnormal or we need to change your treatment, we will call you to review the results.   Medication Instructions:    Continue with your current medicines.    If you need a refill on your cardiac medications before your next appointment, please call your pharmacy.     Testing/Procedures To Be Arranged:  N/A  Follow-Up:   See me in 6 months.     At [facility], you and your health needs are our priority.  As part of our continuing mission to provide you with exceptional heart care, we have created designated Provider Care Teams.  These Care Teams include your primary Cardiologist (physician) and Advanced Practice Providers (APPs -  Physician Assistants and Nurse Practitioners) who all work together to provide you with the care you need, when you need it.  Special Instructions:  . None  Call the [place] office at [phone] if you have any questions, problems or concerns.

## 2018-10-03 ENCOUNTER — Other Ambulatory Visit: Payer: Self-pay | Admitting: Nurse Practitioner

## 2018-11-03 DIAGNOSIS — H25812 Combined forms of age-related cataract, left eye: Secondary | ICD-10-CM | POA: Diagnosis not present

## 2018-11-03 DIAGNOSIS — H25811 Combined forms of age-related cataract, right eye: Secondary | ICD-10-CM | POA: Diagnosis not present

## 2018-11-03 DIAGNOSIS — H353131 Nonexudative age-related macular degeneration, bilateral, early dry stage: Secondary | ICD-10-CM | POA: Diagnosis not present

## 2019-01-04 DIAGNOSIS — H25812 Combined forms of age-related cataract, left eye: Secondary | ICD-10-CM | POA: Diagnosis not present

## 2019-01-04 DIAGNOSIS — H2512 Age-related nuclear cataract, left eye: Secondary | ICD-10-CM | POA: Diagnosis not present

## 2019-02-06 DIAGNOSIS — H2511 Age-related nuclear cataract, right eye: Secondary | ICD-10-CM | POA: Diagnosis not present

## 2019-02-08 DIAGNOSIS — H52221 Regular astigmatism, right eye: Secondary | ICD-10-CM | POA: Diagnosis not present

## 2019-02-08 DIAGNOSIS — H52211 Irregular astigmatism, right eye: Secondary | ICD-10-CM | POA: Diagnosis not present

## 2019-02-08 DIAGNOSIS — H2511 Age-related nuclear cataract, right eye: Secondary | ICD-10-CM | POA: Diagnosis not present

## 2019-02-08 DIAGNOSIS — H25811 Combined forms of age-related cataract, right eye: Secondary | ICD-10-CM | POA: Diagnosis not present

## 2019-03-27 ENCOUNTER — Telehealth: Payer: Self-pay | Admitting: *Deleted

## 2019-03-27 NOTE — Telephone Encounter (Signed)
lvm with instructions for upcoming visit on April 29 with Truitt Merle, NP, pt needs consent.  If pt cannot keep appt is instructed to call office to r/s.

## 2019-03-28 NOTE — Progress Notes (Signed)
Telehealth Visit     Virtual Visit via Telephone Note   This visit type was conducted due to national recommendations for restrictions regarding the COVID-19 Pandemic (e.g. social distancing) in an effort to limit this patient's exposure and mitigate transmission in our community.  Due to her co-morbid illnesses, this patient is at least at moderate risk for complications without adequate follow up.  This format is felt to be most appropriate for this patient at this time.  The patient did not have access to video technology/had technical difficulties with video requiring transitioning to audio format only (telephone).  All issues noted in this document were discussed and addressed.  No physical exam could be performed with this format.  Please refer to the patient's chart for her  consent to telehealth for [facility].   Evaluation Performed:  Follow-up visit  This visit type was conducted due to national recommendations for restrictions regarding the COVID-19 Pandemic (e.g. social distancing).  This format is felt to be most appropriate for this patient at this time.  All issues noted in this document were discussed and addressed.  No physical exam was performed (except for noted visual exam findings with Video Visits).  Please refer to the patient's chart (MyChart message for video visits and phone note for telephone visits) for the patient's consent to telehealth for [facility].  Date:  03/29/2019   ID:  Jill Cooke, DOB 06-02-1941, MRN [id number]  Patient Location:  Home  Provider location:   Home  PCP:  Katherina Mires, MD  Cardiologist:  Servando Snare & No primary care provider on file.  Electrophysiologist:  None   Chief Complaint:  Follow up  History of Present Illness:    Jill Cooke is a 78 y.o. female who presents via audio/video conferencing for a telehealth visit today.  Seen for Dr. Acie Fredrickson. She primarily follows with me.   I saw her in November of 2017and she was doing  ok.Rhythm ok. Struggling with weight loss.She was here for a visit in November of 2018 and was in AF - she was not aware of this. Echo was updated. She did not wish to have cardioversion - she opted for rate control and anticoagulation. She has struggled with weight loss. Last visit with me was back in 09-26-23 - lots of death in her family/friends. Cardiac status felt to be ok.   The patient does not have symptoms concerning for COVID-19 infection (fever, chills, cough, or new shortness of breath).   Seen today via telephone conversation. She does not have Internet or smart phone capabilities. She has consented for this visit. She is doing ok. She notes she is gaining weight due to staying at home with the shelter in place order. No chest pain. BP is good. She has been trying to work in her yard some. She is trying to walk some around the perimeter of her church across the street. Feels good. No chest pain. Not short of breath. No palpitations for the most part. She is not dizzy or lightheaded. No bleeding or excessive bruising noted.   Past Medical History:  Diagnosis Date  . Anxiety    with  diagnosis  . Cancer ([place])    ENDOMETRIAL  . Endometrial hyperplasia   . PAF (paroxysmal atrial fibrillation) ([place])    a. 06/2012 Admitted with asymp afib->converted on IV dilt.  No asa/coumadin 2/2 h/o vaginal bleeding pending d/c;  b. 07/20/2012 Echo:  EF 60-65%, Gr2 DD, mild LVH   .  Postmenopausal bleeding   . Retroversion of uterus   . Vaginal bleeding   . Vulvar ulceration    Past Surgical History:  Procedure Laterality Date  . DILATION AND CURETTAGE OF UTERUS  08/09/2012   Procedure: DILATATION AND CURETTAGE;  Surgeon: Janie Morning, MD PHD;  Location: [facility];  Service: Gynecology;  Laterality: N/A;  . LYMPH NODE DISSECTION  09/13/2012   Procedure: LYMPH NODE DISSECTION;  Surgeon: Imagene Gurney A. Alycia Rossetti, MD;  Location: [facility];  Service: Gynecology;  Laterality: Bilateral;  bilateral pelvic lymph node  dissection  . ROBOTIC ASSISTED LAP VAGINAL HYSTERECTOMY  09/13/2012   Procedure: ROBOTIC ASSISTED LAPAROSCOPIC VAGINAL HYSTERECTOMY;  Surgeon: Imagene Gurney A. Alycia Rossetti, MD;  Location: [facility];  Service: Gynecology;  Laterality: N/A;     Current Meds  Medication Sig  . apixaban (ELIQUIS) 5 MG TABS tablet Take 1 tablet (5 mg total) by mouth 2 (two) times daily.  Marland Kitchen diltiazem (CARDIZEM CD) 240 MG 24 hr capsule TAKE 1 CAPSULE (240 MG TOTAL) DAILY BY MOUTH.  . vitamin B-12 (CYANOCOBALAMIN) 1000 MCG tablet Take 1,000 mcg by mouth daily.     Allergies:   Patient has no known allergies.   Social History   Tobacco Use  . Smoking status: Never Smoker  . Smokeless tobacco: Never Used  Substance Use Topics  . Alcohol use: No    Comment: ocassional wine or beer   2 x month  . Drug use: No     Family Hx: The patient's family history is not on file.  ROS:   Please see the history of present illness.   All other systems reviewed are negative.    Objective:    Vital Signs:  BP 116/68   Ht 5\' 2"  (1.575 m)   Wt 222 lb (100.7 kg)   BMI 40.60 kg/m    Wt Readings from Last 3 Encounters:  03/29/19 222 lb (100.7 kg)  09/28/18 218 lb 6.4 oz (99.1 kg)  03/23/18 213 lb 12.8 oz (97 kg)    Alert female in no acute distress. Not short of breath with conversation.    Labs/Other Tests and Data Reviewed:    Lab Results  Component Value Date   WBC 11.4 (H) 09/28/2018   HGB 14.4 09/28/2018   HCT 44.0 09/28/2018   PLT 372 09/28/2018   GLUCOSE 101 (H) 09/28/2018   CHOL 173 09/28/2018   TRIG 115 09/28/2018   HDL 48 09/28/2018   LDLCALC 102 (H) 09/28/2018   ALT 12 09/28/2018   AST 11 09/28/2018   NA 141 09/28/2018   K 4.1 09/28/2018   CL 100 09/28/2018   CREATININE 1.06 (H) 09/28/2018   BUN 17 09/28/2018   CO2 24 09/28/2018   TSH 2.650 10/13/2017   INR 1.07 07/19/2012     BNP (last 3 results) No results for input(s): BNP in the last 8760 hours.  ProBNP (last 3 results) No results for  input(s): PROBNP in the last 8760 hours.    Prior CV studies:    The following studies were reviewed today:  EchoStudy Conclusions11/2018  - Left ventricle: The cavity size was normal. Wall thickness was increased in a pattern of mild LVH. Systolic function was normal. The estimated ejection fraction was in the range of 60% to 65%. Wall motion was normal; there were no regional wall motion abnormalities. Doppler parameters are consistent with pseudonormal left ventricular relaxation (grade 2 diastolic dysfunction). The E/e&' ratio is >15, suggesting elevated LV filling pressure. - Mitral valve: Mildly  thickened leaflets . There was trivial regurgitation. - Left atrium: The atrium was normal in size. - Tricuspid valve: There was mild regurgitation. - Pulmonary arteries: PA peak pressure: 48 mm Hg (S). - Inferior vena cava: The vessel was normal in size. The respirophasic diameter changes were in the normal range (>= 50%), consistent with normal central venous pressure.  Impressions:  - Compared to a prior study in 2013, there are no significant changes.    ASSESSMENT & PLAN:    1.Persistent AF - she is managed with rate control and anticoagulation. No problems noted.   2. Obesity -she knows she needs to try and be more active. She was encouraged to go and walk over at the church across the street from her - this has always been a struggle for her.   3. HLD - not on therapy - LDL was 102 from lab in October.   4. Chronic anticoagulation - no problems noted   5. Elevated BP - great at home - no changes recommended today.   6. COVID-19 Education: The signs and symptoms of COVID-19 were discussed with the patient and how to seek care for testing (follow up with PCP or arrange E-visit).  The importance of social distancing, staying at home and hand hygiene were discussed today.  Patient Risk:   After full review of this patient's clinical  status, I feel that they are at least moderate risk at this time.  Time:   Today, I have spent 10 minutes with the patient with telehealth technology discussing the above issues.     Medication Adjustments/Labs and Tests Ordered: Current medicines are reviewed at length with the patient today.  Concerns regarding medicines are outlined above.   Tests Ordered: No orders of the defined types were placed in this encounter.   Medication Changes: No orders of the defined types were placed in this encounter.   Disposition:  FU with me in 6 months.  We will check lab on return.   Patient is agreeable to this plan and will call if any problems develop in the interim.   Amie Critchley, NP  03/29/2019 11:06 AM    [place]

## 2019-03-28 NOTE — Telephone Encounter (Signed)

## 2019-03-29 ENCOUNTER — Encounter: Payer: Self-pay | Admitting: Nurse Practitioner

## 2019-03-29 ENCOUNTER — Telehealth (INDEPENDENT_AMBULATORY_CARE_PROVIDER_SITE_OTHER): Payer: PPO | Admitting: Nurse Practitioner

## 2019-03-29 ENCOUNTER — Other Ambulatory Visit: Payer: Self-pay

## 2019-03-29 VITALS — BP 116/68 | Ht 62.0 in | Wt 222.0 lb

## 2019-03-29 DIAGNOSIS — I4819 Other persistent atrial fibrillation: Secondary | ICD-10-CM

## 2019-03-29 DIAGNOSIS — Z7901 Long term (current) use of anticoagulants: Secondary | ICD-10-CM

## 2019-03-29 DIAGNOSIS — Z7189 Other specified counseling: Secondary | ICD-10-CM

## 2019-03-29 DIAGNOSIS — E7800 Pure hypercholesterolemia, unspecified: Secondary | ICD-10-CM

## 2019-03-29 NOTE — Patient Instructions (Addendum)
After Visit Summary:  We will be checking the following labs today - NONE  Medication Instructions:    Continue with your current medicines.    If you need a refill on your cardiac medications before your next appointment, please call your pharmacy.     Testing/Procedures To Be Arranged:  N/A  Follow-Up:   See me in 6 months with EKG and labs    At [facility], you and your health needs are our priority.  As part of our continuing mission to provide you with exceptional heart care, we have created designated Provider Care Teams.  These Care Teams include your primary Cardiologist (physician) and Advanced Practice Providers (APPs -  Physician Assistants and Nurse Practitioners) who all work together to provide you with the care you need, when you need it.  Special Instructions:  . Stay safe, stay home, wash your hands for at least 20 seconds and wear a mask when out in public.  . It was good to talk with you today.    Call the [place] office at [phone] if you have any questions, problems or concerns.

## 2019-05-21 ENCOUNTER — Other Ambulatory Visit: Payer: Self-pay | Admitting: Nurse Practitioner

## 2019-05-22 NOTE — Telephone Encounter (Signed)
Pt last saw Truitt Merle, NP on 03/29/19 telemedicine visit Covid-19, last labs 09/28/18 Creat 1.06, age 78, weight 99.1kg, based on specified criteria pt is on appropriate dosage of Eliquis 5mg  BID.  Will refill rx.

## 2019-09-07 NOTE — Progress Notes (Signed)
CARDIOLOGY OFFICE NOTE  Date:  09/12/2019    Jill Cooke Date of Birth: 1941/06/28 Medical Record [id number]  PCP:  Katherina Mires, MD  Cardiologist:  Servando Snare [place]   Chief Complaint  Patient presents with  . Follow-up    History of Present Illness: Jill Cooke is a 78 y.o. female who presents today for a 6 month check. Seen for Dr. Acie Fredrickson. She primarily follows with me.   I saw her in November of 2017and she was doing ok.Rhythm ok. Struggling with weight loss.She was here for a visit in November of 2018 and was in AF - she was not aware of this. Echo was updated. She did not wish to have cardioversion - she opted for rate control and anticoagulation. She has struggled with weight loss. Last visit with me here in the office was in October of 2019 - we did a telehealth visit back in April - she was doing ok - was gaining weight however with the pandemic.   The patient does not have symptoms concerning for COVID-19 infection (fever, chills, cough, or new shortness of breath).   Comes in today. Here alone. She has been doing ok. Not really getting out. Admits to watching too much TV. Bad food choices. Fortunately, she feels ok. No chest pain. Not short of breath. Tolerating her medicines. No bleeding/excessive bruising. Needs refills. She is unsure about a flu shot. Overall, she feels like she is doing ok but knows she needs to be more active.   Past Medical History:  Diagnosis Date  . Anxiety    with  diagnosis  . Cancer ([place])    ENDOMETRIAL  . Endometrial hyperplasia   . PAF (paroxysmal atrial fibrillation) ([place])    a. 06/2012 Admitted with asymp afib->converted on IV dilt.  No asa/coumadin 2/2 h/o vaginal bleeding pending d/c;  b. 07/20/2012 Echo:  EF 60-65%, Gr2 DD, mild LVH   . Postmenopausal bleeding   . Retroversion of uterus   . Vaginal bleeding   . Vulvar ulceration     Past Surgical History:  Procedure Laterality Date  . DILATION AND CURETTAGE OF UTERUS   08/09/2012   Procedure: DILATATION AND CURETTAGE;  Surgeon: Janie Morning, MD PHD;  [facility];  Service: Gynecology;  Laterality: N/A;  . LYMPH NODE DISSECTION  09/13/2012   Procedure: LYMPH NODE DISSECTION;  Surgeon: Imagene Gurney A. Alycia Rossetti, MD;  Location: [facility];  Service: Gynecology;  Laterality: Bilateral;  bilateral pelvic lymph node dissection  . ROBOTIC ASSISTED LAP VAGINAL HYSTERECTOMY  09/13/2012   Procedure: ROBOTIC ASSISTED LAPAROSCOPIC VAGINAL HYSTERECTOMY;  Surgeon: Imagene Gurney A. Alycia Rossetti, MD;  [facility];  Service: Gynecology;  Laterality: N/A;     Medications: Current Meds  Medication Sig  . apixaban (ELIQUIS) 5 MG TABS tablet Take 1 tablet (5 mg total) by mouth 2 (two) times daily.  Marland Kitchen diltiazem (CARDIZEM CD) 240 MG 24 hr capsule Take 1 capsule (240 mg total) by mouth daily.  . vitamin B-12 (CYANOCOBALAMIN) 1000 MCG tablet Take 1,000 mcg by mouth daily.  . [DISCONTINUED] diltiazem (CARDIZEM CD) 240 MG 24 hr capsule TAKE 1 CAPSULE (240 MG TOTAL) DAILY BY MOUTH.  . [DISCONTINUED] ELIQUIS 5 MG TABS tablet TAKE 1 TABLET BY MOUTH TWICE A DAY     Allergies: No Known Allergies  Social History: The patient  reports that she has never smoked. She has never used smokeless tobacco. She reports that she does not drink alcohol or use drugs.  Family History: The patient's family history is not on file.   Review of Systems: Please see the history of present illness.   All other systems are reviewed and negative.   Physical Exam: VS:  BP 132/80   Pulse 96   Ht 5\' 2"  (1.575 m)   Wt 221 lb 12.8 oz (100.6 kg)   SpO2 97%   BMI 40.57 kg/m  .  BMI Body mass index is 40.57 kg/m.  Wt Readings from Last 3 Encounters:  09/12/19 221 lb 12.8 oz (100.6 kg)  03/29/19 222 lb (100.7 kg)  09/28/18 218 lb 6.4 oz (99.1 kg)    General: Pleasant. Well developed, well nourished and in no acute distress. She is obese.   HEENT: Normal.  Neck: Supple, no JVD, carotid bruits, or masses noted.   Cardiac: Regular rate and rhythm. No murmurs, rubs, or gallops. No edema.  Respiratory:  Lungs are clear to auscultation bilaterally with normal work of breathing.  GI: Soft and nontender.  MS: No deformity or atrophy. Gait and ROM intact.  Skin: Warm and dry. Color is normal.  Neuro:  Strength and sensation are intact and no gross focal deficits noted.  Psych: Alert, appropriate and with normal affect.   LABORATORY DATA:  EKG:  EKG is ordered today. This demonstrates AF with fair control - HR is 96 by EKG.   Lab Results  Component Value Date   WBC 11.4 (H) 09/28/2018   HGB 14.4 09/28/2018   HCT 44.0 09/28/2018   PLT 372 09/28/2018   GLUCOSE 101 (H) 09/28/2018   CHOL 173 09/28/2018   TRIG 115 09/28/2018   HDL 48 09/28/2018   LDLCALC 102 (H) 09/28/2018   ALT 12 09/28/2018   AST 11 09/28/2018   NA 141 09/28/2018   K 4.1 09/28/2018   CL 100 09/28/2018   CREATININE 1.06 (H) 09/28/2018   BUN 17 09/28/2018   CO2 24 09/28/2018   TSH 2.650 10/13/2017   INR 1.07 07/19/2012     BNP (last 3 results) No results for input(s): BNP in the last 8760 hours.  ProBNP (last 3 results) No results for input(s): PROBNP in the last 8760 hours.   Other Studies Reviewed Today:  EchoStudy Conclusions11/2018  - Left ventricle: The cavity size was normal. Wall thickness was increased in a pattern of mild LVH. Systolic function was normal. The estimated ejection fraction was in the range of 60% to 65%. Wall motion was normal; there were no regional wall motion abnormalities. Doppler parameters are consistent with pseudonormal left ventricular relaxation (grade 2 diastolic dysfunction). The E/e&' ratio is >15, suggesting elevated LV filling pressure. - Mitral valve: Mildly thickened leaflets . There was trivial regurgitation. - Left atrium: The atrium was normal in size. - Tricuspid valve: There was mild regurgitation. - Pulmonary arteries: PA peak pressure: 48 mm Hg  (S). - Inferior vena cava: The vessel was normal in size. The respirophasic diameter changes were in the normal range (>= 50%), consistent with normal central venous pressure.  Impressions:  - Compared to a prior study in 2013, there are no significant changes.    ASSESSMENT & PLAN:    1.Persistent AF - has fair rate control - HR little slower by time of physical exam. She feels good. Will continue with current regimen for now.   2. Obesity- long discussion - this has always been an issue that she struggles with.   3. HLD - not on therapy - rechecking lab today.   4.  Chronic anticoagulation - no problems noted - recheck lab today.   5. COVID-19 Education: The signs and symptoms of COVID-19 were discussed with the patient and how to seek care for testing (follow up with PCP or arrange E-visit).  The importance of social distancing, staying at home, hand hygiene and wearing a mask when out in public were discussed today.  Current medicines are reviewed with the patient today.  The patient does not have concerns regarding medicines other than what has been noted above.  The following changes have been made:  See above.  Labs/ tests ordered today include:    Orders Placed This Encounter  Procedures  . Basic metabolic panel  . CBC  . Hepatic function panel  . Lipid panel  . EKG 12-Lead     Disposition:   FU with me in 6 months.   Patient is agreeable to this plan and will call if any problems develop in the interim.   SignedTruitt Merle, NP  09/12/2019 12:06 PM  [place] [address] [place], [place]  [address] Phone: [phone] Fax: [phone]

## 2019-09-12 ENCOUNTER — Ambulatory Visit (INDEPENDENT_AMBULATORY_CARE_PROVIDER_SITE_OTHER): Payer: PPO | Admitting: Nurse Practitioner

## 2019-09-12 ENCOUNTER — Encounter: Payer: Self-pay | Admitting: Nurse Practitioner

## 2019-09-12 ENCOUNTER — Other Ambulatory Visit: Payer: Self-pay

## 2019-09-12 VITALS — BP 132/80 | HR 96 | Ht 62.0 in | Wt 221.8 lb

## 2019-09-12 DIAGNOSIS — I10 Essential (primary) hypertension: Secondary | ICD-10-CM | POA: Diagnosis not present

## 2019-09-12 DIAGNOSIS — I4819 Other persistent atrial fibrillation: Secondary | ICD-10-CM | POA: Diagnosis not present

## 2019-09-12 DIAGNOSIS — Z7901 Long term (current) use of anticoagulants: Secondary | ICD-10-CM

## 2019-09-12 DIAGNOSIS — E7800 Pure hypercholesterolemia, unspecified: Secondary | ICD-10-CM | POA: Diagnosis not present

## 2019-09-12 LAB — BASIC METABOLIC PANEL
BUN/Creatinine Ratio: 16 (ref 12–28)
BUN: 17 mg/dL (ref 8–27)
CO2: 26 mmol/L (ref 20–29)
Calcium: 9.1 mg/dL (ref 8.7–10.3)
Chloride: 101 mmol/L (ref 96–106)
Creatinine, Ser: 1.06 mg/dL — ABNORMAL HIGH (ref 0.57–1.00)
GFR calc Af Amer: 58 mL/min/{1.73_m2} — ABNORMAL LOW (ref >59)
GFR calc non Af Amer: 50 mL/min/{1.73_m2} — ABNORMAL LOW (ref >59)
Glucose: 115 mg/dL — ABNORMAL HIGH (ref 65–99)
Potassium: 4 mmol/L (ref 3.5–5.2)
Sodium: 141 mmol/L (ref 134–144)

## 2019-09-12 LAB — CBC
Hematocrit: 43.4 % (ref 34.0–46.6)
Hemoglobin: 14.6 g/dL (ref 11.1–15.9)
MCH: 30.8 pg (ref 26.6–33.0)
MCHC: 33.6 g/dL (ref 31.5–35.7)
MCV: 92 fL (ref 79–97)
Platelets: 323 10*3/uL (ref 150–450)
RBC: 4.74 x10E6/uL (ref 3.77–5.28)
RDW: 12.8 % (ref 11.7–15.4)
WBC: 11.1 10*3/uL — ABNORMAL HIGH (ref 3.4–10.8)

## 2019-09-12 LAB — HEPATIC FUNCTION PANEL
ALT: 14 IU/L (ref 0–32)
AST: 15 IU/L (ref 0–40)
Albumin: 4.1 g/dL (ref 3.7–4.7)
Alkaline Phosphatase: 138 IU/L — ABNORMAL HIGH (ref 39–117)
Bilirubin Total: 0.4 mg/dL (ref 0.0–1.2)
Bilirubin, Direct: 0.13 mg/dL (ref 0.00–0.40)
Total Protein: 7.1 g/dL (ref 6.0–8.5)

## 2019-09-12 LAB — LIPID PANEL
Chol/HDL Ratio: 4.3 ratio (ref 0.0–4.4)
Cholesterol, Total: 195 mg/dL (ref 100–199)
HDL: 45 mg/dL (ref >39)
LDL Chol Calc (NIH): 131 mg/dL — ABNORMAL HIGH (ref 0–99)
Triglycerides: 107 mg/dL (ref 0–149)
VLDL Cholesterol Cal: 19 mg/dL (ref 5–40)

## 2019-09-12 MED ORDER — DILTIAZEM HCL ER COATED BEADS 240 MG PO CP24: 240.0000 mg | ORAL_CAPSULE | Freq: Every day | ORAL | 3 refills | Status: DC

## 2019-09-12 MED ORDER — APIXABAN 5 MG PO TABS: 5.0000 mg | ORAL_TABLET | Freq: Two times a day (BID) | ORAL | 3 refills | Status: DC

## 2019-09-12 NOTE — Patient Instructions (Addendum)
After Visit Summary:  We will be checking the following labs today - BMET, CBC, HPF and lipids   Medication Instructions:    Continue with your current medicines.   I sent your refills in for you   If you need a refill on your cardiac medications before your next appointment, please call your pharmacy.     Testing/Procedures To Be Arranged:  N/A  Follow-Up:   See me in 6 months    At [facility], you and your health needs are our priority.  As part of our continuing mission to provide you with exceptional heart care, we have created designated Provider Care Teams.  These Care Teams include your primary Cardiologist (physician) and Advanced Practice Providers (APPs -  Physician Assistants and Nurse Practitioners) who all work together to provide you with the care you need, when you need it.  Special Instructions:  . Stay safe, stay home, wash your hands for at least 20 seconds and wear a mask when out in public.  . It was good to talk with you today.  . Walking every day!! Less TV!   Call the [place] office at [phone] if you have any questions, problems or concerns.

## 2019-10-23 DIAGNOSIS — Z961 Presence of intraocular lens: Secondary | ICD-10-CM | POA: Diagnosis not present

## 2019-10-23 DIAGNOSIS — H353131 Nonexudative age-related macular degeneration, bilateral, early dry stage: Secondary | ICD-10-CM | POA: Diagnosis not present

## 2020-03-12 NOTE — Progress Notes (Signed)
CARDIOLOGY OFFICE NOTE  Date:  03/19/2020    Jill Cooke Date of Birth: [birth year] Medical Record [id number]  PCP:  Katherina Mires, MD  Cardiologist:  Servando Snare Nahser  Chief Complaint  Patient presents with  . Follow-up    Seen for Dr. Acie Fredrickson    History of Present Illness: Jill Cooke is a 79 y.o. female who presents today for a 6 month check. Seen for Dr. Acie Fredrickson. She primarily follows with me.  I saw her in November of 2017and she was doing ok.Rhythm ok. Struggling with weight loss.She was here for a visit in November of 2018 and was in AF - she was not aware of this. Echo was updated. She did not wish to have cardioversion - she opted for rate control and anticoagulation.She has struggled with weight loss. Last seen here in October - not getting out due to the pandemic. Admitted to lots of TV and eating badly.   The patient does not have symptoms concerning for COVID-19 infection (fever, chills, cough, or new shortness of breath).   Comes in today. Here alone. She has hurt her knee - that has taken about 3 months to get better. She got a new dog after Christmas - but messed her knee up because of the dog. Has been frantic over some tax issues as well. Just started walking a bit. No chest pain. Breathing is ok. Has not gotten a vaccine - not sure she is going to do that - staying home most of the time. No palpitations. Not dizzy. No falls.   Past Medical History:  Diagnosis Date  . Anxiety    with  diagnosis  . Cancer ([place])    ENDOMETRIAL  . Endometrial hyperplasia   . PAF (paroxysmal atrial fibrillation) ([place])    a. 06/2012 Admitted with asymp afib->converted on IV dilt.  No asa/coumadin 2/2 h/o vaginal bleeding pending d/c;  b. 07/20/2012 Echo:  EF 60-65%, Gr2 DD, mild LVH   . Postmenopausal bleeding   . Retroversion of uterus   . Vaginal bleeding   . Vulvar ulceration     Past Surgical History:  Procedure Laterality Date  . DILATION AND CURETTAGE OF UTERUS   08/09/2012   Procedure: DILATATION AND CURETTAGE;  Surgeon: Janie Morning, MD PHD;  Location: [facility];  Service: Gynecology;  Laterality: N/A;  . LYMPH NODE DISSECTION  09/13/2012   Procedure: LYMPH NODE DISSECTION;  Surgeon: Imagene Gurney A. Alycia Rossetti, MD;  Location: [facility];  Service: Gynecology;  Laterality: Bilateral;  bilateral pelvic lymph node dissection  . ROBOTIC ASSISTED LAP VAGINAL HYSTERECTOMY  09/13/2012   Procedure: ROBOTIC ASSISTED LAPAROSCOPIC VAGINAL HYSTERECTOMY;  Surgeon: Imagene Gurney A. Alycia Rossetti, MD;  Location: [facility];  Service: Gynecology;  Laterality: N/A;     Medications: Current Meds  Medication Sig  . apixaban (ELIQUIS) 5 MG TABS tablet Take 1 tablet (5 mg total) by mouth 2 (two) times daily.  Marland Kitchen diltiazem (CARDIZEM CD) 240 MG 24 hr capsule Take 1 capsule (240 mg total) by mouth daily.  . Misc Natural Products (NEURIVA PO) Take 1 capsule by mouth daily.  . vitamin B-12 (CYANOCOBALAMIN) 1000 MCG tablet Take 1,000 mcg by mouth daily.     Allergies: No Known Allergies  Social History: The patient  reports that she has never smoked. She has never used smokeless tobacco. She reports that she does not drink alcohol or use drugs.   Family History: The patient's family history is not on file.  Review of Systems: Please see the history of present illness.   All other systems are reviewed and negative.   Physical Exam: VS:  BP 132/68   Pulse 91   Ht 5\' 2"  (1.575 m)   Wt 225 lb 12.8 oz (102.4 kg)   SpO2 98%   BMI 41.30 kg/m  .  BMI Body mass index is 41.3 kg/m.  Wt Readings from Last 3 Encounters:  03/19/20 225 lb 12.8 oz (102.4 kg)  09/12/19 221 lb 12.8 oz (100.6 kg)  03/29/19 222 lb (100.7 kg)    General: Alert. Obese. She is in no acute distress. Her weight continues to climb.   Cardiac: Irregular irregular rhythm. Her rate is ok.  No murmurs, rubs, or gallops. No edema.  Respiratory:  Lungs are clear to auscultation bilaterally with normal work of breathing.  GI: Soft  and nontender.  MS: No deformity or atrophy. Gait and ROM intact.  Skin: Warm and dry. Color is normal.  Neuro:  Strength and sensation are intact and no gross focal deficits noted.  Psych: Alert, appropriate and with normal affect.   LABORATORY DATA:  EKG:  EKG is not ordered today.    Lab Results  Component Value Date   WBC 11.1 (H) 09/12/2019   HGB 14.6 09/12/2019   HCT 43.4 09/12/2019   PLT 323 09/12/2019   GLUCOSE 115 (H) 09/12/2019   CHOL 195 09/12/2019   TRIG 107 09/12/2019   HDL 45 09/12/2019   LDLCALC 131 (H) 09/12/2019   ALT 14 09/12/2019   AST 15 09/12/2019   NA 141 09/12/2019   K 4.0 09/12/2019   CL 101 09/12/2019   CREATININE 1.06 (H) 09/12/2019   BUN 17 09/12/2019   CO2 26 09/12/2019   TSH 2.650 10/13/2017   INR 1.07 07/19/2012     BNP (last 3 results) No results for input(s): BNP in the last 8760 hours.  ProBNP (last 3 results) No results for input(s): PROBNP in the last 8760 hours.   Other Studies Reviewed Today:  EchoStudy Conclusions11/2018  - Left ventricle: The cavity size was normal. Wall thickness was increased in a pattern of mild LVH. Systolic function was normal. The estimated ejection fraction was in the range of 60% to 65%. Wall motion was normal; there were no regional wall motion abnormalities. Doppler parameters are consistent with pseudonormal left ventricular relaxation (grade 2 diastolic dysfunction). The E/e&' ratio is >15, suggesting elevated LV filling pressure. - Mitral valve: Mildly thickened leaflets . There was trivial regurgitation. - Left atrium: The atrium was normal in size. - Tricuspid valve: There was mild regurgitation. - Pulmonary arteries: PA peak pressure: 48 mm Hg (S). - Inferior vena cava: The vessel was normal in size. The respirophasic diameter changes were in the normal range (>= 50%), consistent with normal central venous pressure.  Impressions:  - Compared to a prior  study in 2013, there are no significant changes.    ASSESSMENT & PLAN:   1. Persistent AF - managed with rate control and anticoagulation - would continue Eliquis and Diltiazem.   2. Chronic anticoagulation - no problems noted - needs surveillance labs today   3. Obesity - always a struggle - unfortunately, I do not see this changing.   4. HLD - not discussed.   5. COVID-19 Education: The signs and symptoms of COVID-19 were discussed with the patient and how to seek care for testing (follow up with PCP or arrange E-visit).  The importance of social distancing, staying  at home, hand hygiene and wearing a mask when out in public were discussed today. Unsure about the vaccine at this time.   Current medicines are reviewed with the patient today.  The patient does not have concerns regarding medicines other than what has been noted above.  The following changes have been made:  See above.  Labs/ tests ordered today include:    Orders Placed This Encounter  Procedures  . Basic metabolic panel  . CBC     Disposition:   FU with me in 6 months.   Patient is agreeable to this plan and will call if any problems develop in the interim.   SignedTruitt Merle, NP  03/19/2020 12:09 PM  [facility] [address] [place], [place]  [address] Phone: [phone] Fax: [phone]

## 2020-03-19 ENCOUNTER — Encounter: Payer: Self-pay | Admitting: Nurse Practitioner

## 2020-03-19 ENCOUNTER — Other Ambulatory Visit: Payer: Self-pay

## 2020-03-19 ENCOUNTER — Ambulatory Visit: Payer: PPO | Admitting: Nurse Practitioner

## 2020-03-19 VITALS — BP 132/68 | HR 91 | Ht 62.0 in | Wt 225.8 lb

## 2020-03-19 DIAGNOSIS — I4819 Other persistent atrial fibrillation: Secondary | ICD-10-CM | POA: Diagnosis not present

## 2020-03-19 DIAGNOSIS — Z7901 Long term (current) use of anticoagulants: Secondary | ICD-10-CM

## 2020-03-19 NOTE — Patient Instructions (Addendum)
After Visit Summary:  We will be checking the following labs today - BMET & CBC   Medication Instructions:    Continue with your current medicines.    If you need a refill on your cardiac medications before your next appointment, please call your pharmacy.     Testing/Procedures To Be Arranged:  N/A  Follow-Up:   See me in 6 months    At [facility], you and your health needs are our priority.  As part of our continuing mission to provide you with exceptional heart care, we have created designated Provider Care Teams.  These Care Teams include your primary Cardiologist (physician) and Advanced Practice Providers (APPs -  Physician Assistants and Nurse Practitioners) who all work together to provide you with the care you need, when you need it.  Special Instructions:  . Stay safe, stay home, wash your hands for at least 20 seconds and wear a mask when out in public.  . It was good to talk with you today.    Call the [place] office at [phone] if you have any questions, problems or concerns.

## 2020-03-20 LAB — CBC
Hematocrit: 41.1 % (ref 34.0–46.6)
Hemoglobin: 13.7 g/dL (ref 11.1–15.9)
MCH: 30.2 pg (ref 26.6–33.0)
MCHC: 33.3 g/dL (ref 31.5–35.7)
MCV: 91 fL (ref 79–97)
Platelets: 410 10*3/uL (ref 150–450)
RBC: 4.54 x10E6/uL (ref 3.77–5.28)
RDW: 14.2 % (ref 11.7–15.4)
WBC: 10 10*3/uL (ref 3.4–10.8)

## 2020-03-20 LAB — BASIC METABOLIC PANEL
BUN/Creatinine Ratio: 18 (ref 12–28)
BUN: 17 mg/dL (ref 8–27)
CO2: 26 mmol/L (ref 20–29)
Calcium: 8.9 mg/dL (ref 8.7–10.3)
Chloride: 101 mmol/L (ref 96–106)
Creatinine, Ser: 0.92 mg/dL (ref 0.57–1.00)
GFR calc Af Amer: 68 mL/min/{1.73_m2} (ref >59)
GFR calc non Af Amer: 59 mL/min/{1.73_m2} — ABNORMAL LOW (ref >59)
Glucose: 109 mg/dL — ABNORMAL HIGH (ref 65–99)
Potassium: 4 mmol/L (ref 3.5–5.2)
Sodium: 141 mmol/L (ref 134–144)

## 2020-09-09 NOTE — Progress Notes (Signed)
CARDIOLOGY OFFICE NOTE  Date:  09/18/2020    Jill Cooke Date of Birth: September 10, 1941 Medical Record #[id number]  PCP:  Jill Mires, MD  Cardiologist:  Jill Cooke   Chief Complaint  Patient presents with  . Follow-up    Seen for Dr. Acie Cooke    History of Present Illness: Jill Cooke is a 79 y.o. female who presents today for a follow up visit. Seen for Dr. Acie Cooke.   I saw her in November of 2017and she was doing ok.Rhythm ok. Struggling with weight loss.She was here for a visit in November of 2018 and was in AF - she was not aware of this. Echo was updated. She did not wish to have cardioversion - she opted for rate control and anticoagulation.She has struggled with weight loss. Last seen here in April - still struggling with diet/weight loss - having knee issues due to a new dog. Was considering getting vaccinated.   Comes in today. Here alone. She is doing about the same. Struggles with lifestyle recommendations - she admits she stress eats. Weight up a bit - more stress with her fridge that went out - can't get a new one until maybe next month. No chest pain. Breathing seems stable. No palpitations. Did not get COVID vaccine - says she is staying "safe".   Past Medical History:  Diagnosis Date  . Anxiety    with  diagnosis  . Cancer ([place])    ENDOMETRIAL  . Endometrial hyperplasia   . PAF (paroxysmal atrial fibrillation) ([place])    a. 06/2012 Admitted with asymp afib->converted on IV dilt.  No asa/coumadin 2/2 h/o vaginal bleeding pending d/c;  b. 07/20/2012 Echo:  EF 60-65%, Gr2 DD, mild LVH   . Postmenopausal bleeding   . Retroversion of uterus   . Vaginal bleeding   . Vulvar ulceration     Past Surgical History:  Procedure Laterality Date  . DILATION AND CURETTAGE OF UTERUS  08/09/2012   Procedure: DILATATION AND CURETTAGE;  Surgeon: Jill Morning, MD PHD;  [facility]: [facility];  Service: Gynecology;  Laterality: N/A;  . LYMPH NODE DISSECTION  09/13/2012    Procedure: LYMPH NODE DISSECTION;  Surgeon: Jill Gurney A. Alycia Rossetti, MD;  Location: [facility];  Service: Gynecology;  Laterality: Bilateral;  bilateral pelvic lymph node dissection  . ROBOTIC ASSISTED LAP VAGINAL HYSTERECTOMY  09/13/2012   Procedure: ROBOTIC ASSISTED LAPAROSCOPIC VAGINAL HYSTERECTOMY;  Surgeon: Jill Gurney A. Alycia Rossetti, MD;  Location: [facility];  Service: Gynecology;  Laterality: N/A;     Medications: Current Meds  Medication Sig  . apixaban (ELIQUIS) 5 MG TABS tablet Take 1 tablet (5 mg total) by mouth 2 (two) times daily.  Marland Kitchen diltiazem (CARDIZEM CD) 240 MG 24 hr capsule TAKE 1 CAPSULE BY MOUTH EVERY DAY  . Misc Natural Products (NEURIVA PO) Take 1 capsule by mouth daily.  . vitamin B-12 (CYANOCOBALAMIN) 1000 MCG tablet Take 1,000 mcg by mouth daily.  . Zinc Sulfate (ZINC 15 PO) Take by mouth.  . [DISCONTINUED] ELIQUIS 5 MG TABS tablet TAKE 1 TABLET BY MOUTH TWICE A DAY     Allergies: No Known Allergies  Social History: The patient  reports that she has never smoked. She has never used smokeless tobacco. She reports that she does not drink alcohol and does not use drugs.   Family History: The patient's family history is not on file.   Review of Systems: Please see the history of present illness.   All other systems  are reviewed and negative.   Physical Exam: VS:  BP 130/84   Pulse (!) 108   Ht 5\' 2"  (1.575 m)   Wt 227 lb 9.6 oz (103.2 kg)   SpO2 98%   BMI 41.63 kg/m  .  BMI Body mass index is 41.63 kg/m.  Wt Readings from Last 3 Encounters:  09/18/20 227 lb 9.6 oz (103.2 kg)  03/19/20 225 lb 12.8 oz (102.4 kg)  09/12/19 221 lb 12.8 oz (100.6 kg)    General: Pleasant. Alert and in no acute distress. She remains obese.  Cardiac: Irregular irregular rhythm - her rate is fine. HR down to 88 by me.  No murmurs, rubs, or gallops. No edema.  Respiratory:  Lungs are clear to auscultation bilaterally with normal work of breathing.  GI: Soft and nontender.  MS: No deformity or  atrophy. Gait and ROM intact.  Skin: Warm and dry. Color is normal.  Neuro:  Strength and sensation are intact and no gross focal deficits noted.  Psych: Alert, appropriate and with normal affect.   LABORATORY DATA:  EKG:  EKG is ordered today.  Personally reviewed by me. This demonstrates persistent AF with a VR of 108.  Lab Results  Component Value Date   WBC 10.0 03/19/2020   HGB 13.7 03/19/2020   HCT 41.1 03/19/2020   PLT 410 03/19/2020   GLUCOSE 109 (H) 03/19/2020   CHOL 195 09/12/2019   TRIG 107 09/12/2019   HDL 45 09/12/2019   LDLCALC 131 (H) 09/12/2019   ALT 14 09/12/2019   AST 15 09/12/2019   NA 141 03/19/2020   K 4.0 03/19/2020   CL 101 03/19/2020   CREATININE 0.92 03/19/2020   BUN 17 03/19/2020   CO2 26 03/19/2020   TSH 2.650 10/13/2017   INR 1.07 07/19/2012     BNP (last 3 results) No results for input(s): BNP in the last 8760 hours.  ProBNP (last 3 results) No results for input(s): PROBNP in the last 8760 hours.   Other Studies Reviewed Today:  EchoStudy Conclusions11/2018  - Left ventricle: The cavity size was normal. Wall thickness was increased in a pattern of mild LVH. Systolic function was normal. The estimated ejection fraction was in the range of 60% to 65%. Wall motion was normal; there were no regional wall motion abnormalities. Doppler parameters are consistent with pseudonormal left ventricular relaxation (grade 2 diastolic dysfunction). The E/e&' ratio is >15, suggesting elevated LV filling pressure. - Mitral valve: Mildly thickened leaflets . There was trivial regurgitation. - Left atrium: The atrium was normal in size. - Tricuspid valve: There was mild regurgitation. - Pulmonary arteries: PA peak pressure: 48 mm Hg (S). - Inferior vena cava: The vessel was normal in size. The respirophasic diameter changes were in the normal range (>= 50%), consistent with normal central venous  pressure.  Impressions:  - Compared to a prior study in 2013, there are no significant changes.    ASSESSMENT & PLAN:   1. Persistent AF - managed with rate control and continues on anticoagulation - her rate has slowed since EKG done today - I have left her on her current regimen.   2. Chronic anticoagulation - no problems noted. Needs surveillance lab today.   3. Obesity - this is a struggle for her - she admits to stress eating.   4. HLD - lab today.   5. COVID 19 - she has no plans to get vaccinated. Encouraged her to continue to wear her mask.  Current medicines are reviewed with the patient today.  The patient does not have concerns regarding medicines other than what has been noted above.  The following changes have been made:  See above.  Labs/ tests ordered today include:    Orders Placed This Encounter  Procedures  . Basic metabolic panel  . CBC  . Hepatic function panel  . Lipid panel  . EKG 12-Lead     Disposition:   FU with Dr. Acie Cooke in 6 months. She is aware that I am leaving in February.    Patient is agreeable to this plan and will call if any problems develop in the interim.   SignedTruitt Merle, NP  09/18/2020 12:26 PM  [facility] [address] [place], [place]  [address] Phone: [phone] Fax: [phone]

## 2020-09-14 ENCOUNTER — Other Ambulatory Visit: Payer: Self-pay | Admitting: Nurse Practitioner

## 2020-09-14 DIAGNOSIS — I4819 Other persistent atrial fibrillation: Secondary | ICD-10-CM

## 2020-09-15 ENCOUNTER — Other Ambulatory Visit: Payer: Self-pay | Admitting: Nurse Practitioner

## 2020-09-16 NOTE — Telephone Encounter (Signed)
Pt last saw Truitt Merle, NP on 03/19/20, last labs 03/19/20 Creat 0.92, age 79, weight 102.4 kg, based on specified criteria pt is on appropriate dosage of Eliquis 5mg  BID.  Will refill rx.

## 2020-09-18 ENCOUNTER — Encounter: Payer: Self-pay | Admitting: Nurse Practitioner

## 2020-09-18 ENCOUNTER — Ambulatory Visit: Payer: PPO | Admitting: Nurse Practitioner

## 2020-09-18 ENCOUNTER — Other Ambulatory Visit: Payer: Self-pay

## 2020-09-18 VITALS — BP 130/84 | HR 108 | Ht 62.0 in | Wt 227.6 lb

## 2020-09-18 DIAGNOSIS — I4819 Other persistent atrial fibrillation: Secondary | ICD-10-CM

## 2020-09-18 DIAGNOSIS — E7800 Pure hypercholesterolemia, unspecified: Secondary | ICD-10-CM | POA: Diagnosis not present

## 2020-09-18 DIAGNOSIS — Z7901 Long term (current) use of anticoagulants: Secondary | ICD-10-CM

## 2020-09-18 DIAGNOSIS — I10 Essential (primary) hypertension: Secondary | ICD-10-CM | POA: Diagnosis not present

## 2020-09-18 LAB — BASIC METABOLIC PANEL
BUN/Creatinine Ratio: 14 (ref 12–28)
BUN: 15 mg/dL (ref 8–27)
CO2: 25 mmol/L (ref 20–29)
Calcium: 9.2 mg/dL (ref 8.7–10.3)
Chloride: 98 mmol/L (ref 96–106)
Creatinine, Ser: 1.09 mg/dL — ABNORMAL HIGH (ref 0.57–1.00)
GFR calc Af Amer: 55 mL/min/{1.73_m2} — ABNORMAL LOW (ref >59)
GFR calc non Af Amer: 48 mL/min/{1.73_m2} — ABNORMAL LOW (ref >59)
Glucose: 117 mg/dL — ABNORMAL HIGH (ref 65–99)
Potassium: 4.1 mmol/L (ref 3.5–5.2)
Sodium: 139 mmol/L (ref 134–144)

## 2020-09-18 LAB — HEPATIC FUNCTION PANEL
ALT: 11 IU/L (ref 0–32)
AST: 16 IU/L (ref 0–40)
Albumin: 4.1 g/dL (ref 3.7–4.7)
Alkaline Phosphatase: 153 IU/L — ABNORMAL HIGH (ref 44–121)
Bilirubin Total: 0.4 mg/dL (ref 0.0–1.2)
Bilirubin, Direct: 0.14 mg/dL (ref 0.00–0.40)
Total Protein: 7.3 g/dL (ref 6.0–8.5)

## 2020-09-18 LAB — CBC
Hematocrit: 42.2 % (ref 34.0–46.6)
Hemoglobin: 14.5 g/dL (ref 11.1–15.9)
MCH: 31.3 pg (ref 26.6–33.0)
MCHC: 34.4 g/dL (ref 31.5–35.7)
MCV: 91 fL (ref 79–97)
Platelets: 355 10*3/uL (ref 150–450)
RBC: 4.64 x10E6/uL (ref 3.77–5.28)
RDW: 12.8 % (ref 11.7–15.4)
WBC: 10.5 10*3/uL (ref 3.4–10.8)

## 2020-09-18 LAB — LIPID PANEL
Chol/HDL Ratio: 4 ratio (ref 0.0–4.4)
Cholesterol, Total: 186 mg/dL (ref 100–199)
HDL: 47 mg/dL (ref >39)
LDL Chol Calc (NIH): 121 mg/dL — ABNORMAL HIGH (ref 0–99)
Triglycerides: 100 mg/dL (ref 0–149)
VLDL Cholesterol Cal: 18 mg/dL (ref 5–40)

## 2020-09-18 MED ORDER — APIXABAN 5 MG PO TABS: 5.0000 mg | ORAL_TABLET | Freq: Two times a day (BID) | ORAL | 3 refills | Status: AC

## 2020-09-18 NOTE — Patient Instructions (Addendum)
After Visit Summary:  We will be checking the following labs today - BMET, CBC, HPF and Lipids   Medication Instructions:    Continue with your current medicines.   I sent refills in for your Eliquis today   If you need a refill on your cardiac medications before your next appointment, please call your pharmacy.     Testing/Procedures To Be Arranged:  N/A  Follow-Up:   See Dr. Acie Fredrickson in 6 months -  You will receive a reminder letter in the mail two months in advance. If you don't receive a letter, please call our office to schedule the follow-up appointment.     At [facility], you and your health needs are our priority.  As part of our continuing mission to provide you with exceptional heart care, we have created designated Provider Care Teams.  These Care Teams include your primary Cardiologist (physician) and Advanced Practice Providers (APPs -  Physician Assistants and Nurse Practitioners) who all work together to provide you with the care you need, when you need it.  Special Instructions:  . Stay safe, wash your hands for at least 20 seconds and wear a mask when needed.  . It was good to talk with you today.    Call the [place] office at [phone] if you have any questions, problems or concerns.

## 2020-09-19 ENCOUNTER — Other Ambulatory Visit: Payer: Self-pay | Admitting: *Deleted

## 2020-09-19 DIAGNOSIS — I4819 Other persistent atrial fibrillation: Secondary | ICD-10-CM

## 2020-09-19 MED ORDER — DILTIAZEM HCL ER COATED BEADS 240 MG PO CP24: ORAL_CAPSULE | ORAL | 3 refills | Status: AC

## 2020-10-22 DIAGNOSIS — Z961 Presence of intraocular lens: Secondary | ICD-10-CM | POA: Diagnosis not present

## 2020-10-22 DIAGNOSIS — H353131 Nonexudative age-related macular degeneration, bilateral, early dry stage: Secondary | ICD-10-CM | POA: Diagnosis not present

## 2020-11-07 ENCOUNTER — Encounter (HOSPITAL_COMMUNITY): Payer: Self-pay | Admitting: Emergency Medicine

## 2020-11-07 ENCOUNTER — Emergency Department (HOSPITAL_COMMUNITY): Payer: PPO

## 2020-11-07 ENCOUNTER — Other Ambulatory Visit: Payer: Self-pay

## 2020-11-07 ENCOUNTER — Inpatient Hospital Stay (HOSPITAL_COMMUNITY)
Admission: EM | Admit: 2020-11-07 | Discharge: 2020-11-30 | DRG: 177 | Disposition: E | Payer: PPO | Attending: Internal Medicine | Admitting: Internal Medicine

## 2020-11-07 DIAGNOSIS — I10 Essential (primary) hypertension: Secondary | ICD-10-CM

## 2020-11-07 DIAGNOSIS — R918 Other nonspecific abnormal finding of lung field: Secondary | ICD-10-CM | POA: Diagnosis not present

## 2020-11-07 DIAGNOSIS — N179 Acute kidney failure, unspecified: Secondary | ICD-10-CM

## 2020-11-07 DIAGNOSIS — R7401 Elevation of levels of liver transaminase levels: Secondary | ICD-10-CM | POA: Diagnosis present

## 2020-11-07 DIAGNOSIS — R739 Hyperglycemia, unspecified: Secondary | ICD-10-CM | POA: Diagnosis not present

## 2020-11-07 DIAGNOSIS — R7989 Other specified abnormal findings of blood chemistry: Secondary | ICD-10-CM

## 2020-11-07 DIAGNOSIS — G9349 Other encephalopathy: Secondary | ICD-10-CM | POA: Diagnosis not present

## 2020-11-07 DIAGNOSIS — Z7901 Long term (current) use of anticoagulants: Secondary | ICD-10-CM

## 2020-11-07 DIAGNOSIS — Z515 Encounter for palliative care: Secondary | ICD-10-CM | POA: Diagnosis not present

## 2020-11-07 DIAGNOSIS — J1282 Pneumonia due to coronavirus disease 2019: Secondary | ICD-10-CM | POA: Diagnosis present

## 2020-11-07 DIAGNOSIS — R7303 Prediabetes: Secondary | ICD-10-CM | POA: Diagnosis present

## 2020-11-07 DIAGNOSIS — T68XXXA Hypothermia, initial encounter: Secondary | ICD-10-CM | POA: Diagnosis not present

## 2020-11-07 DIAGNOSIS — Z79899 Other long term (current) drug therapy: Secondary | ICD-10-CM

## 2020-11-07 DIAGNOSIS — R339 Retention of urine, unspecified: Secondary | ICD-10-CM | POA: Diagnosis not present

## 2020-11-07 DIAGNOSIS — E669 Obesity, unspecified: Secondary | ICD-10-CM | POA: Diagnosis present

## 2020-11-07 DIAGNOSIS — Z20828 Contact with and (suspected) exposure to other viral communicable diseases: Secondary | ICD-10-CM | POA: Diagnosis not present

## 2020-11-07 DIAGNOSIS — I480 Paroxysmal atrial fibrillation: Secondary | ICD-10-CM | POA: Diagnosis not present

## 2020-11-07 DIAGNOSIS — E876 Hypokalemia: Secondary | ICD-10-CM

## 2020-11-07 DIAGNOSIS — E870 Hyperosmolality and hypernatremia: Secondary | ICD-10-CM | POA: Diagnosis not present

## 2020-11-07 DIAGNOSIS — F05 Delirium due to known physiological condition: Secondary | ICD-10-CM | POA: Diagnosis not present

## 2020-11-07 DIAGNOSIS — Z6838 Body mass index (BMI) 38.0-38.9, adult: Secondary | ICD-10-CM | POA: Diagnosis not present

## 2020-11-07 DIAGNOSIS — I4891 Unspecified atrial fibrillation: Secondary | ICD-10-CM | POA: Diagnosis not present

## 2020-11-07 DIAGNOSIS — U071 COVID-19: Secondary | ICD-10-CM | POA: Diagnosis present

## 2020-11-07 DIAGNOSIS — G9341 Metabolic encephalopathy: Secondary | ICD-10-CM | POA: Diagnosis present

## 2020-11-07 DIAGNOSIS — R0902 Hypoxemia: Secondary | ICD-10-CM | POA: Diagnosis not present

## 2020-11-07 DIAGNOSIS — E6609 Other obesity due to excess calories: Secondary | ICD-10-CM | POA: Diagnosis present

## 2020-11-07 DIAGNOSIS — I129 Hypertensive chronic kidney disease with stage 1 through stage 4 chronic kidney disease, or unspecified chronic kidney disease: Secondary | ICD-10-CM | POA: Diagnosis present

## 2020-11-07 DIAGNOSIS — J9601 Acute respiratory failure with hypoxia: Secondary | ICD-10-CM | POA: Diagnosis not present

## 2020-11-07 DIAGNOSIS — K8020 Calculus of gallbladder without cholecystitis without obstruction: Secondary | ICD-10-CM | POA: Diagnosis not present

## 2020-11-07 DIAGNOSIS — Z781 Physical restraint status: Secondary | ICD-10-CM

## 2020-11-07 DIAGNOSIS — R54 Age-related physical debility: Secondary | ICD-10-CM | POA: Diagnosis present

## 2020-11-07 DIAGNOSIS — F419 Anxiety disorder, unspecified: Secondary | ICD-10-CM | POA: Diagnosis not present

## 2020-11-07 DIAGNOSIS — R000 Tachycardia, unspecified: Secondary | ICD-10-CM | POA: Diagnosis not present

## 2020-11-07 DIAGNOSIS — J80 Acute respiratory distress syndrome: Secondary | ICD-10-CM | POA: Diagnosis not present

## 2020-11-07 DIAGNOSIS — Z66 Do not resuscitate: Secondary | ICD-10-CM | POA: Diagnosis not present

## 2020-11-07 DIAGNOSIS — T380X5A Adverse effect of glucocorticoids and synthetic analogues, initial encounter: Secondary | ICD-10-CM | POA: Diagnosis not present

## 2020-11-07 DIAGNOSIS — N1831 Chronic kidney disease, stage 3a: Secondary | ICD-10-CM

## 2020-11-07 DIAGNOSIS — N183 Chronic kidney disease, stage 3 unspecified: Secondary | ICD-10-CM

## 2020-11-07 DIAGNOSIS — Z8542 Personal history of malignant neoplasm of other parts of uterus: Secondary | ICD-10-CM | POA: Diagnosis not present

## 2020-11-07 LAB — LACTIC ACID, PLASMA
Lactic Acid, Venous: 1.9 mmol/L (ref 0.5–1.9)
Lactic Acid, Venous: 1.7 mmol/L (ref 0.5–1.9)

## 2020-11-07 LAB — CBC WITH DIFFERENTIAL/PLATELET
Abs Immature Granulocytes: 0.08 10*3/uL — ABNORMAL HIGH (ref 0.00–0.07)
Basophils Absolute: 0 10*3/uL (ref 0.0–0.1)
Basophils Relative: 0 %
Eosinophils Absolute: 0 10*3/uL (ref 0.0–0.5)
Eosinophils Relative: 0 %
HCT: 48.9 % — ABNORMAL HIGH (ref 36.0–46.0)
Hemoglobin: 16.5 g/dL — ABNORMAL HIGH (ref 12.0–15.0)
Immature Granulocytes: 1 %
Lymphocytes Relative: 5 %
Lymphs Abs: 0.5 10*3/uL — ABNORMAL LOW (ref 0.7–4.0)
MCH: 30.6 pg (ref 26.0–34.0)
MCHC: 33.7 g/dL (ref 30.0–36.0)
MCV: 90.7 fL (ref 80.0–100.0)
Monocytes Absolute: 0.3 10*3/uL (ref 0.1–1.0)
Monocytes Relative: 4 %
Neutro Abs: 8.2 10*3/uL — ABNORMAL HIGH (ref 1.7–7.7)
Neutrophils Relative %: 90 %
Platelets: 264 10*3/uL (ref 150–400)
RBC: 5.39 MIL/uL — ABNORMAL HIGH (ref 3.87–5.11)
RDW: 14 % (ref 11.5–15.5)
WBC: 9.1 10*3/uL (ref 4.0–10.5)
nRBC: 0 % (ref 0.0–0.2)

## 2020-11-07 LAB — COMPREHENSIVE METABOLIC PANEL
ALT: 109 U/L — ABNORMAL HIGH (ref 0–44)
AST: 179 U/L — ABNORMAL HIGH (ref 15–41)
Albumin: 3.1 g/dL — ABNORMAL LOW (ref 3.5–5.0)
Alkaline Phosphatase: 98 U/L (ref 38–126)
Anion gap: 14 (ref 5–15)
BUN: 28 mg/dL — ABNORMAL HIGH (ref 8–23)
CO2: 25 mmol/L (ref 22–32)
Calcium: 8.3 mg/dL — ABNORMAL LOW (ref 8.9–10.3)
Chloride: 96 mmol/L — ABNORMAL LOW (ref 98–111)
Creatinine, Ser: 1.21 mg/dL — ABNORMAL HIGH (ref 0.44–1.00)
GFR, Estimated: 45 mL/min — ABNORMAL LOW (ref >60)
Glucose, Bld: 113 mg/dL — ABNORMAL HIGH (ref 70–99)
Potassium: 3.4 mmol/L — ABNORMAL LOW (ref 3.5–5.1)
Sodium: 135 mmol/L (ref 135–145)
Total Bilirubin: 1.1 mg/dL (ref 0.3–1.2)
Total Protein: 7.5 g/dL (ref 6.5–8.1)

## 2020-11-07 LAB — FERRITIN: Ferritin: 3653 ng/mL — ABNORMAL HIGH (ref 11–307)

## 2020-11-07 LAB — RESP PANEL BY RT-PCR (FLU A&B, COVID) ARPGX2
Influenza A by PCR: NEGATIVE
Influenza B by PCR: NEGATIVE
SARS Coronavirus 2 by RT PCR: POSITIVE — AB

## 2020-11-07 LAB — LACTATE DEHYDROGENASE: LDH: 695 U/L — ABNORMAL HIGH (ref 98–192)

## 2020-11-07 LAB — C-REACTIVE PROTEIN: CRP: 19.3 mg/dL — ABNORMAL HIGH (ref <1.0)

## 2020-11-07 LAB — MAGNESIUM: Magnesium: 2.2 mg/dL (ref 1.7–2.4)

## 2020-11-07 LAB — FIBRINOGEN: Fibrinogen: 675 mg/dL — ABNORMAL HIGH (ref 210–475)

## 2020-11-07 LAB — HEPARIN LEVEL (UNFRACTIONATED): Heparin Unfractionated: <0.1 IU/mL — ABNORMAL LOW (ref 0.30–0.70)

## 2020-11-07 LAB — PROTIME-INR
INR: 1.2 (ref 0.8–1.2)
Prothrombin Time: 14.8 seconds (ref 11.4–15.2)

## 2020-11-07 LAB — TRIGLYCERIDES: Triglycerides: 92 mg/dL (ref <150)

## 2020-11-07 LAB — PROCALCITONIN: Procalcitonin: 0.54 ng/mL

## 2020-11-07 LAB — MRSA PCR SCREENING: MRSA by PCR: NEGATIVE

## 2020-11-07 LAB — D-DIMER, QUANTITATIVE: D-Dimer, Quant: 4.76 ug/mL-FEU — ABNORMAL HIGH (ref 0.00–0.50)

## 2020-11-07 LAB — APTT: aPTT: 31 seconds (ref 24–36)

## 2020-11-07 MED ORDER — CHLORHEXIDINE GLUCONATE 0.12 % MT SOLN
15.0000 mL | Freq: Two times a day (BID) | OROMUCOSAL | Status: DC
  Administered 2020-11-07 – 2020-11-13 (×12): 15 mL via OROMUCOSAL
  Filled 2020-11-07 (×12): qty 15

## 2020-11-07 MED ORDER — SODIUM CHLORIDE 0.9 % IV SOLN
200.0000 mg | Freq: Once | INTRAVENOUS | Status: AC
  Administered 2020-11-07: 200 mg via INTRAVENOUS
  Filled 2020-11-07: qty 200

## 2020-11-07 MED ORDER — CHLORHEXIDINE GLUCONATE CLOTH 2 % EX PADS
6.0000 | MEDICATED_PAD | Freq: Every day | CUTANEOUS | Status: DC
  Administered 2020-11-07 – 2020-11-13 (×6): 6 via TOPICAL

## 2020-11-07 MED ORDER — ORAL CARE MOUTH RINSE
15.0000 mL | Freq: Two times a day (BID) | OROMUCOSAL | Status: DC
  Administered 2020-11-08 – 2020-11-13 (×12): 15 mL via OROMUCOSAL

## 2020-11-07 MED ORDER — HEPARIN (PORCINE) 25000 UT/250ML-% IV SOLN
1200.0000 [IU]/h | INTRAVENOUS | Status: DC
  Administered 2020-11-07: 1200 [IU]/h via INTRAVENOUS
  Filled 2020-11-07: qty 250

## 2020-11-07 MED ORDER — ACETAMINOPHEN 325 MG PO TABS
650.0000 mg | ORAL_TABLET | Freq: Once | ORAL | Status: AC
  Administered 2020-11-07: 650 mg via ORAL
  Filled 2020-11-07: qty 2

## 2020-11-07 MED ORDER — LABETALOL HCL 5 MG/ML IV SOLN: 10.0000 mg | INTRAVENOUS | Status: DC | PRN

## 2020-11-07 MED ORDER — ACETAMINOPHEN 325 MG PO TABS: 650.0000 mg | ORAL_TABLET | Freq: Four times a day (QID) | ORAL | Status: DC | PRN

## 2020-11-07 MED ORDER — SODIUM CHLORIDE 0.9 % IV SOLN
2.0000 g | INTRAVENOUS | Status: AC
  Administered 2020-11-07 – 2020-11-10 (×4): 2 g via INTRAVENOUS
  Filled 2020-11-07: qty 2
  Filled 2020-11-07: qty 20
  Filled 2020-11-07 (×2): qty 2

## 2020-11-07 MED ORDER — DEXAMETHASONE SODIUM PHOSPHATE 10 MG/ML IJ SOLN
10.0000 mg | Freq: Once | INTRAMUSCULAR | Status: AC
  Administered 2020-11-07: 10 mg via INTRAVENOUS
  Filled 2020-11-07: qty 1

## 2020-11-07 MED ORDER — METHYLPREDNISOLONE SODIUM SUCC 125 MG IJ SOLR
100.0000 mg | Freq: Two times a day (BID) | INTRAMUSCULAR | Status: AC
  Administered 2020-11-07 – 2020-11-10 (×6): 100 mg via INTRAVENOUS
  Filled 2020-11-07 (×5): qty 2

## 2020-11-07 MED ORDER — SODIUM CHLORIDE 0.9 % IV SOLN
100.0000 mg | Freq: Every day | INTRAVENOUS | Status: AC
  Administered 2020-11-08 – 2020-11-11 (×4): 100 mg via INTRAVENOUS
  Filled 2020-11-07 (×4): qty 20

## 2020-11-07 MED ORDER — SODIUM CHLORIDE 0.9 % IV SOLN: 200.0000 mg | Freq: Once | INTRAVENOUS | Status: DC

## 2020-11-07 MED ORDER — SODIUM CHLORIDE 0.9 % IV SOLN: 100.0000 mg | Freq: Every day | INTRAVENOUS | Status: DC

## 2020-11-07 MED ORDER — HEPARIN BOLUS VIA INFUSION
4500.0000 [IU] | Freq: Once | INTRAVENOUS | Status: AC
  Administered 2020-11-07: 4500 [IU] via INTRAVENOUS
  Filled 2020-11-07: qty 4500

## 2020-11-07 MED ORDER — SODIUM CHLORIDE 0.9 % IV SOLN
500.0000 mg | INTRAVENOUS | Status: AC
  Administered 2020-11-07 – 2020-11-11 (×5): 500 mg via INTRAVENOUS
  Filled 2020-11-07 (×5): qty 500

## 2020-11-07 MED ORDER — PREDNISONE 20 MG PO TABS
50.0000 mg | ORAL_TABLET | Freq: Every day | ORAL | Status: DC
  Administered 2020-11-10 – 2020-11-12 (×3): 50 mg via ORAL
  Filled 2020-11-07 (×4): qty 2

## 2020-11-07 MED ORDER — GUAIFENESIN-DM 100-10 MG/5ML PO SYRP
10.0000 mL | ORAL_SOLUTION | ORAL | Status: DC | PRN
  Administered 2020-11-09 – 2020-11-10 (×2): 10 mL via ORAL
  Filled 2020-11-07 (×2): qty 10

## 2020-11-07 MED ORDER — SODIUM CHLORIDE 0.9% FLUSH
3.0000 mL | Freq: Two times a day (BID) | INTRAVENOUS | Status: DC
  Administered 2020-11-07 – 2020-11-13 (×13): 3 mL via INTRAVENOUS

## 2020-11-07 MED ORDER — IPRATROPIUM-ALBUTEROL 20-100 MCG/ACT IN AERS
1.0000 | INHALATION_SPRAY | Freq: Four times a day (QID) | RESPIRATORY_TRACT | Status: DC
  Administered 2020-11-08 – 2020-11-13 (×21): 1 via RESPIRATORY_TRACT
  Filled 2020-11-07: qty 4

## 2020-11-07 MED ORDER — FUROSEMIDE 10 MG/ML IJ SOLN
20.0000 mg | Freq: Once | INTRAMUSCULAR | Status: AC
  Administered 2020-11-07: 20 mg via INTRAVENOUS
  Filled 2020-11-07: qty 4

## 2020-11-07 MED ORDER — POTASSIUM CHLORIDE CRYS ER 20 MEQ PO TBCR
20.0000 meq | EXTENDED_RELEASE_TABLET | Freq: Once | ORAL | Status: AC
  Administered 2020-11-07: 20 meq via ORAL
  Filled 2020-11-07: qty 1

## 2020-11-07 MED ORDER — DILTIAZEM HCL ER COATED BEADS 120 MG PO CP24: 240.0000 mg | ORAL_CAPSULE | Freq: Every day | ORAL | Status: DC

## 2020-11-07 MED ORDER — DILTIAZEM HCL ER COATED BEADS 120 MG PO CP24
240.0000 mg | ORAL_CAPSULE | Freq: Every day | ORAL | Status: DC
  Administered 2020-11-07 – 2020-11-12 (×6): 240 mg via ORAL
  Filled 2020-11-07 (×7): qty 2

## 2020-11-07 MED ORDER — BARICITINIB 2 MG PO TABS
2.0000 mg | ORAL_TABLET | Freq: Every day | ORAL | Status: DC
  Administered 2020-11-07 – 2020-11-11 (×5): 2 mg via ORAL
  Filled 2020-11-07 (×5): qty 1

## 2020-11-07 NOTE — ED Triage Notes (Signed)
BIBA Per EMS: Pt coming from home with complaints of weakness, SOB, and fevers since Thanksgiving.  Upon arrival, O2 in the 64s. NRB placed on pt with sats at 91% A&O x4 124/70BP Hx afib  118 HR  98.7Temp  91% NRB

## 2020-11-07 NOTE — Progress Notes (Signed)
ANTICOAGULATION CONSULT NOTE -  Consult  Pharmacy Consult for IV heparin Indication: empiric VTE treatment   No Known Allergies  Patient Measurements: Height: 5\' 2"  (157.5 cm) Weight: 102.1 kg (225 lb) IBW/kg (Calculated) : 50.1 Heparin Dosing Weight:   Vital Signs: Temp: 99.9 F (37.7 C) (12/09 0903) Temp Source: Axillary (12/09 0903) BP: 125/66 (12/09 1400) Pulse Rate: 111 (12/09 1400)  Labs: Recent Labs    11/25/2020 0936 11/17/2020 1307 11/02/2020 1357  HGB 16.5*  --   --   HCT 48.9*  --   --   PLT 264  --   --   APTT  --   --  31  LABPROT  --   --  14.8  INR  --   --  1.2  HEPARINUNFRC  --  <0.10*  --   CREATININE 1.21*  --   --     Estimated Creatinine Clearance: 41.5 mL/min (A) (by C-G formula based on SCr of 1.21 mg/dL (H)).   Medical History: Past Medical History:  Diagnosis Date  . Anxiety    with  diagnosis  . Cancer ([place])    ENDOMETRIAL  . Endometrial hyperplasia   . PAF (paroxysmal atrial fibrillation) ([place])    a. 06/2012 Admitted with asymp afib->converted on IV dilt.  No asa/coumadin 2/2 h/o vaginal bleeding pending d/c;  b. 07/20/2012 Echo:  EF 60-65%, Gr2 DD, mild LVH   . Postmenopausal bleeding   . Retroversion of uterus   . Vaginal bleeding   . Vulvar ulceration     Medications:  Scheduled:  . baricitinib  2 mg Oral Daily  . diltiazem  240 mg Oral Daily  . Ipratropium-Albuterol  1 puff Inhalation Q6H  . methylPREDNISolone (SOLU-MEDROL) injection  100 mg Intravenous Q12H   Followed by  . [START ON 11/10/2020] predniSONE  50 mg Oral Daily  . sodium chloride flush  3 mL Intravenous Q12H    Assessment: Pharmacy is consulted to dose heparin in 79 yo female that is COVID positive. Per consult, pt is being empirically treated for PE as a CT has been unable to be obtained. Pt is listed as being on apixaban PTA for afib. Med rec is in process of completion but pt's daughter states that pt has not had  Eliquis in > 1 week as pt has not been feeling  well.   Today, 11/27/2020   Baseline labs : INR 1.2, PT 14.8, aPTT 31   HL is < 0.10, confirming  no recent apixaban intake    Scr 1.21 mg/dl, CrCl 41 ml/min   D-Dimer has been ordered    Goal of Therapy:  Heparin level 0.3-0.7 units/ml Monitor platelets by anticoagulation protocol: Yes   Plan:   Heparin 4500 unit bolus followed by heparin 1200 units/hr   Obtain HL 8 hours after start of infusion   Daily CBC   Monitor clinical course, renal function, cultures as available   Royetta Asal, PharmD, BCPS 10/30/2020 2:56 PM

## 2020-11-07 NOTE — Plan of Care (Signed)
  Problem: Education: Goal: Knowledge of General Education information will improve Description: Including pain rating scale, medication(s)/side effects and non-pharmacologic comfort measures Outcome: Progressing   Problem: Health Behavior/Discharge Planning: Goal: Ability to manage health-related needs will improve Outcome: Progressing   Problem: Clinical Measurements: Goal: Ability to maintain clinical measurements within normal limits will improve Outcome: Progressing Goal: Will remain free from infection Outcome: Progressing Goal: Diagnostic test results will improve Outcome: Progressing Goal: Respiratory complications will improve Outcome: Progressing Goal: Cardiovascular complication will be avoided Outcome: Progressing   Problem: Activity: Goal: Risk for activity intolerance will decrease Outcome: Progressing   Problem: Nutrition: Goal: Adequate nutrition will be maintained Outcome: Progressing   Problem: Coping: Goal: Level of anxiety will decrease Outcome: Progressing   Problem: Elimination: Goal: Will not experience complications related to bowel motility Outcome: Progressing Goal: Will not experience complications related to urinary retention Outcome: Progressing   Problem: Pain Managment: Goal: General experience of comfort will improve Outcome: Progressing   Problem: Safety: Goal: Ability to remain free from injury will improve Outcome: Progressing   Problem: Skin Integrity: Goal: Risk for impaired skin integrity will decrease Outcome: Progressing   Problem: Activity: Goal: Ability to tolerate increased activity will improve Outcome: Progressing   Problem: Clinical Measurements: Goal: Ability to maintain a body temperature in the normal range will improve Outcome: Progressing   Problem: Respiratory: Goal: Ability to maintain adequate ventilation will improve Outcome: Progressing Goal: Ability to maintain a clear airway will improve Outcome:  Progressing   Problem: Education: Goal: Knowledge of risk factors and measures for prevention of condition will improve Outcome: Progressing   Problem: Coping: Goal: Psychosocial and spiritual needs will be supported Outcome: Progressing   Problem: Respiratory: Goal: Will maintain a patent airway Outcome: Progressing Goal: Complications related to the disease process, condition or treatment will be avoided or minimized Outcome: Progressing   

## 2020-11-07 NOTE — H&P (Addendum)
History and Physical        Hospital Admission Note Date: 11/12/2020  Patient name: Jill Cooke Medical record number: [id number] Date of birth: 09-29-1941 Age: 79 y.o. Gender: female  PCP: Katherina Mires, MD  Patient coming from: home Lives with: husband   Chief Complaint    Chief Complaint  Patient presents with  . Weakness  . Shortness of Breath      HPI:   Patient is a poor historian and history is obtained from prior notes as well as the patient  This is an 79 year old female who is not vaccinated against COVID-19 with a past medical history of atrial fibrillation on Eliquis and hypertension who presented to the ED with weakness, shortness of breath and fever since 10/27/2020.  Patient's husband was initially sick the day after Thanksgiving with intermittent fevers and patient subsequently became sick sometime later.  Currently she denies any other symptoms other than malaise.  She denies any known history of VTE, COPD or asthma.   ED Course: Afebrile, tachycardic, tachypneic,  hypoxic (SpO2 76%) currently on 40 L/min HFNC. Notable Labs: Sodium 135, K3.4, chloride 96, glucose 113, BUN 28, creatinine 1.21 (baseline 0.9), AST 179, ALT 109, LDH 695, triglycerides 92, ferritin 3653, CRP 19, lactic acid 1.9, procalcitonin 0.5, WBC 9.1, Hb 16.5, platelets 264, D-dimer 4.76, fibrinogen 675, COVID-19 positive. Notable Imaging: CXR with bilateral interstitial and airspace opacities consistent with multifocal pneumonia. Patient received dexamethasone.    Vitals:   11/04/2020 1219 11/16/2020 1230  BP:    Pulse:  91  Resp:  (!) 21  Temp:    SpO2: 92% 90%     Review of Systems:  Review of Systems  All other systems reviewed and are negative.   Medical/Social/Family History   Past Medical History: Past Medical History:  Diagnosis Date  . Anxiety    with  diagnosis  .  Cancer ([place])    ENDOMETRIAL  . Endometrial hyperplasia   . PAF (paroxysmal atrial fibrillation) ([place])    a. 06/2012 Admitted with asymp afib->converted on IV dilt.  No asa/coumadin 2/2 h/o vaginal bleeding pending d/c;  b. 07/20/2012 Echo:  EF 60-65%, Gr2 DD, mild LVH   . Postmenopausal bleeding   . Retroversion of uterus   . Vaginal bleeding   . Vulvar ulceration     Past Surgical History:  Procedure Laterality Date  . DILATION AND CURETTAGE OF UTERUS  08/09/2012   Procedure: DILATATION AND CURETTAGE;  Surgeon: Janie Morning, MD PHD;  Location: [facility];  Service: Gynecology;  Laterality: N/A;  . LYMPH NODE DISSECTION  09/13/2012   Procedure: LYMPH NODE DISSECTION;  Surgeon: Imagene Gurney A. Alycia Rossetti, MD;  Location: [facility];  Service: Gynecology;  Laterality: Bilateral;  bilateral pelvic lymph node dissection  . ROBOTIC ASSISTED LAP VAGINAL HYSTERECTOMY  09/13/2012   Procedure: ROBOTIC ASSISTED LAPAROSCOPIC VAGINAL HYSTERECTOMY;  Surgeon: Imagene Gurney A. Alycia Rossetti, MD;  Location: [facility];  Service: Gynecology;  Laterality: N/A;    Medications: Prior to Admission medications   Medication Sig Start Date End Date Taking? Authorizing Provider  apixaban (ELIQUIS) 5 MG TABS tablet Take 1 tablet (5 mg total) by mouth 2 (two) times daily. 09/18/20   Burtis Junes,  NP  diltiazem (CARDIZEM CD) 240 MG 24 hr capsule TAKE 1 CAPSULE BY MOUTH EVERY DAY Patient taking differently: Take 240 mg by mouth daily. 09/19/20   Burtis Junes, NP  Misc Natural Products (NEURIVA PO) Take 1 capsule by mouth daily.    [provider]  vitamin B-12 (CYANOCOBALAMIN) 1000 MCG tablet Take 1,000 mcg by mouth daily.    [provider]  Zinc Sulfate (ZINC 15 PO) Take by mouth.    [provider]    Allergies:  No Known Allergies  Social History:  reports that she has never smoked. She has never used smokeless tobacco. She reports that she does not drink alcohol and does not use drugs.  Family  History: History reviewed. No pertinent family history.   Objective   Physical Exam: Blood pressure (!) 216/169, pulse 91, temperature 99.9 F (37.7 C), temperature source Axillary, resp. rate (!) 21, height 5\' 2"  (1.575 m), weight 102.1 kg, SpO2 90 %.  Physical Exam Vitals and nursing note reviewed.  Constitutional:      General: She is not in acute distress. HENT:     Head: Normocephalic.  Cardiovascular:     Rate and Rhythm: Tachycardia present. Rhythm irregular.  Pulmonary:     Effort: Pulmonary effort is normal. No tachypnea.     Comments: Mask fell off briefly and was 69% on RA.  Resident and I set the patient up in bed and replaced her mask and her O2 improved to high 80s to low 90s Musculoskeletal:     Cervical back: Normal range of motion.     Right lower leg: No tenderness. No edema.     Left lower leg: No tenderness. No edema.  Skin:    General: Skin is warm.     Coloration: Skin is not pale.  Neurological:     Mental Status: She is alert and oriented to person, place, and time.  Psychiatric:        Mood and Affect: Mood normal.     Comments: Seems to have poor insight     LABS on Admission: I have personally reviewed all the labs and imaging below    Basic Metabolic Panel: Recent Labs  Lab 11/23/2020 0936  NA 135  K 3.4*  CL 96*  CO2 25  GLUCOSE 113*  BUN 28*  CREATININE 1.21*  CALCIUM 8.3*   Liver Function Tests: Recent Labs  Lab 11/24/2020 0936  AST 179*  ALT 109*  ALKPHOS 98  BILITOT 1.1  PROT 7.5  ALBUMIN 3.1*   No results for input(s): LIPASE, AMYLASE in the last 168 hours. No results for input(s): AMMONIA in the last 168 hours. CBC: Recent Labs  Lab 11/06/2020 0936  WBC 9.1  NEUTROABS 8.2*  HGB 16.5*  HCT 48.9*  MCV 90.7  PLT 264   Cardiac Enzymes: No results for input(s): CKTOTAL, CKMB, CKMBINDEX, TROPONINI in the last 168 hours. BNP: Invalid input(s): POCBNP CBG: No results for input(s): GLUCAP in the last 168  hours.  Radiological Exams on Admission:  DG Chest Port 1 View  Result Date: 11/19/2020 CLINICAL DATA:  Dyspnea.  Weakness, shortness of breath and fevers. EXAM: PORTABLE CHEST 1 VIEW COMPARISON:  September 09, 2012. FINDINGS: Bilateral interstitial and airspace opacities. Low lung volumes. No visible pleural effusions or pneumothorax. Cardiac silhouette is largely obscured. No acute osseous abnormality. Bilateral shoulder degenerative change. IMPRESSION: Bilateral interstitial and airspace opacities, compatible with multifocal pneumonia. Electronically Signed   By: Margaretha Sheffield MD  On: 11/12/2020 09:59      EKG: Low voltage, left axis deviation, A. fib with RVR   A & P   Principal Problem:   Acute hypoxemic respiratory failure due to COVID-19 [facility]) Active Problems:   Atrial fibrillation with RVR (HCC)   Essential (primary) hypertension   Encephalopathy due to COVID-19 virus   AKI (acute kidney injury) ([place])   CKD (chronic kidney disease), stage III (HCC)   Hypokalemia   1. Acute hypoxic respiratory failure secondary to COVID-19, concern for concomitant PE and/or CAP SpO2 down to 69% at bedside on RA. Currently requires 40 L/min on HFNC, on RA at baseline CRP 19 - patient with poor insight into underlying disease process to discuss risks/benefits of immunomodulators and unable to reach family. Will treat with immunomodulators for now D Dimer 4.76 - Unable to check CT chest due to AKI -> start empiric heparin and check CT when able Procalcitonin slightly positive - empirically start CAP treatment with Ceftriaxone/Azithromycin Remdesivir x 5 days, High dose Steroids x 10 days Inhalers, antitussives, Lasix x 1 Encourage Incentive Spirometry, Flutter Valve and Pronation as tolerated She is at high risk of deterioration over the next 24-48 hours. Notified Dr. Carlis Abbott, PCCM, due to concern for impending intubation in the near future. She will see in formal consult if patient requires  intubation  2. A. fib with RVR a. Did not take her home medications, will restart home cardizem b. Change eliquis for heparin as above c. Check Mg  3. Suspected mild COVID 19 Encephalopathy a. She is AAOx3 but seems to have poor insight. Unclear what her baseline is. Unable to contact familiy  4. Hypertension a. Restart home Cardizem b. Labetolol PRN  5. AKI on CKD 3 a a. Likely from hemodynamic changes b. Follow up in AM  6. Hypokalemia a. replete  7. Elevated LFTs likely viral induced a. monitor   DVT prophylaxis: heparin   Code Status: Full Code  Diet: heart healthy Family Communication: Admission, patients condition and plan of care including tests being ordered have been discussed with the patient who indicates understanding and agrees with the plan and Code Status. Patient's daughter and husband were both called but neither picked up  Disposition Plan: The appropriate patient status for this patient is INPATIENT. Inpatient status is judged to be reasonable and necessary in order to provide the required intensity of service to ensure the patient's safety. The patient's presenting symptoms, physical exam findings, and initial radiographic and laboratory data in the context of their chronic comorbidities is felt to place them at high risk for further clinical deterioration. Furthermore, it is not anticipated that the patient will be medically stable for discharge from the hospital within 2 midnights of admission. The following factors support the patient status of inpatient.   " The patient's presenting symptoms include shortness of breath. " The worrisome physical exam findings include hypoxia. " The initial radiographic and laboratory data are worrisome because of COVID 19 positive, elevated D dimer. " The chronic co-morbidities include afib, hypertension.   * I certify that at the point of admission it is my clinical judgment that the patient will require inpatient hospital  care spanning beyond 2 midnights from the point of admission due to high intensity of service, high risk for further deterioration and high frequency of surveillance required.*   Status is: Inpatient  Remains inpatient appropriate because:IV treatments appropriate due to intensity of illness or inability to take PO and Inpatient level of care appropriate  due to severity of illness   Dispo: The patient is from: Home              Anticipated d/c is to: TBD              Anticipated d/c date is: > 3 days              Patient currently is not medically stable to d/c.         The medical decision making on this patient was of high complexity and the patient is at high risk for clinical deterioration, therefore this is a level 3 admission.  Consultants  . Discussed with PCCM over the phone  Procedures  . none  Time Spent on Admission: 75 minutes    Harold Hedge, DO [facility]  11/24/2020, 1:00 PM

## 2020-11-07 NOTE — ED Provider Notes (Signed)
[place] DEPT Provider Note   CSN: [id number] Arrival date & time: 11/17/2020  2297     History Chief Complaint  Patient presents with  . Weakness  . Shortness of Breath    Jill Cooke is a 79 y.o. female w/ PMHx of HTN, Afib, borderline diabetes and obesity presenting with weakness, SOB, and fever since 10/27/20.  Husband was initially sick the day after thanksgiving with intermittent fevers. She subsequently became sick with the above symptoms 12 days ago. Coming in today due to worsening symptoms. Is not vaccinated against COVID and has not been tested for COVID. States she takes medication for her heart.      Past Medical History:  Diagnosis Date  . Anxiety    with  diagnosis  . Cancer ([place])    ENDOMETRIAL  . Endometrial hyperplasia   . PAF (paroxysmal atrial fibrillation) ([place])    a. 06/2012 Admitted with asymp afib->converted on IV dilt.  No asa/coumadin 2/2 h/o vaginal bleeding pending d/c;  b. 07/20/2012 Echo:  EF 60-65%, Gr2 DD, mild LVH   . Postmenopausal bleeding   . Retroversion of uterus   . Vaginal bleeding   . Vulvar ulceration     Patient Active Problem List   Diagnosis Date Noted  . Edema leg 10/22/2014  . Adult BMI 30+ 10/22/2014  . Borderline diabetes mellitus 10/22/2014  . Atrial fibrillation ([place]) 04/26/2014  . Essential (primary) hypertension 04/26/2014  . Endometrial cancer ([place]) 08/18/2012  . PAF (paroxysmal atrial fibrillation) ([place]) 07/20/2012  . Post-menopausal bleeding 07/07/2012    Past Surgical History:  Procedure Laterality Date  . DILATION AND CURETTAGE OF UTERUS  08/09/2012   Procedure: DILATATION AND CURETTAGE;  Surgeon: Janie Morning, MD PHD;  Location: [facility];  Service: Gynecology;  Laterality: N/A;  . LYMPH NODE DISSECTION  09/13/2012   Procedure: LYMPH NODE DISSECTION;  Surgeon: Imagene Gurney A. Alycia Rossetti, MD;  Location: [facility];  Service: Gynecology;  Laterality: Bilateral;  bilateral pelvic lymph node  dissection  . ROBOTIC ASSISTED LAP VAGINAL HYSTERECTOMY  09/13/2012   Procedure: ROBOTIC ASSISTED LAPAROSCOPIC VAGINAL HYSTERECTOMY;  Surgeon: Imagene Gurney A. Alycia Rossetti, MD;  Location: [facility];  Service: Gynecology;  Laterality: N/A;     OB History   No obstetric history on file.     No family history on file.  Social History   Tobacco Use  . Smoking status: Never Smoker  . Smokeless tobacco: Never Used  Vaping Use  . Vaping Use: Never used  Substance Use Topics  . Alcohol use: No    Comment: ocassional wine or beer   2 x month  . Drug use: No    Home Medications Prior to Admission medications   Medication Sig Start Date End Date Taking? Authorizing Provider  apixaban (ELIQUIS) 5 MG TABS tablet Take 1 tablet (5 mg total) by mouth 2 (two) times daily. 09/18/20   Burtis Junes, NP  diltiazem (CARDIZEM CD) 240 MG 24 hr capsule TAKE 1 CAPSULE BY MOUTH EVERY DAY 09/19/20   Burtis Junes, NP  Misc Natural Products (NEURIVA PO) Take 1 capsule by mouth daily.    [provider]  vitamin B-12 (CYANOCOBALAMIN) 1000 MCG tablet Take 1,000 mcg by mouth daily.    [provider]  Zinc Sulfate (ZINC 15 PO) Take by mouth.    [provider]    Allergies    Patient has no known allergies.  Review of Systems   Review of Systems  Constitutional: Positive  for activity change and fever.  Respiratory: Positive for shortness of breath.   Gastrointestinal: Negative for abdominal pain, diarrhea, nausea and vomiting.  Neurological: Positive for weakness.   Physical Exam Updated Vital Signs BP (!) 142/112 (BP Location: Left Arm)   Pulse (!) 139   Temp 99.9 F (37.7 C) (Axillary)   Resp (!) 32   SpO2 (!) 85% Comment: Simultaneous filing. User may not have seen previous data.  Physical Exam Constitutional:      General: She is in acute distress.     Appearance: She is ill-appearing. She is not toxic-appearing.  HENT:     Head: Normocephalic.  Eyes:      Extraocular Movements: Extraocular movements intact.  Cardiovascular:     Rate and Rhythm: Tachycardia present. Rhythm irregular.  Pulmonary:     Effort: Tachypnea and respiratory distress present.     Breath sounds: Examination of the right-upper field reveals wheezing. Examination of the left-upper field reveals rales. Wheezing and rales present.  Musculoskeletal:     Right lower leg: No edema.     Left lower leg: No edema.  Neurological:     Mental Status: She is alert.     Motor: Weakness present.    ED Results / Procedures / Treatments   Labs (all labs ordered are listed, but only abnormal results are displayed) Labs Reviewed  RESP PANEL BY RT-PCR (FLU A&B, COVID) ARPGX2 - Abnormal; Notable for the following components:      Result Value   SARS Coronavirus 2 by RT PCR POSITIVE (*)    All other components within normal limits  CBC WITH DIFFERENTIAL/PLATELET - Abnormal; Notable for the following components:   RBC 5.39 (*)    Hemoglobin 16.5 (*)    HCT 48.9 (*)    Neutro Abs 8.2 (*)    Lymphs Abs 0.5 (*)    Abs Immature Granulocytes 0.08 (*)    All other components within normal limits  COMPREHENSIVE METABOLIC PANEL - Abnormal; Notable for the following components:   Potassium 3.4 (*)    Chloride 96 (*)    Glucose, Bld 113 (*)    BUN 28 (*)    Creatinine, Ser 1.21 (*)    Calcium 8.3 (*)    Albumin 3.1 (*)    AST 179 (*)    ALT 109 (*)    GFR, Estimated 45 (*)    All other components within normal limits  D-DIMER, QUANTITATIVE (NOT AT [facility]) - Abnormal; Notable for the following components:   D-Dimer, Quant 4.76 (*)    All other components within normal limits  LACTATE DEHYDROGENASE - Abnormal; Notable for the following components:   LDH 695 (*)    All other components within normal limits  FERRITIN - Abnormal; Notable for the following components:   Ferritin 3,653 (*)    All other components within normal limits  FIBRINOGEN - Abnormal; Notable for the following  components:   Fibrinogen 675 (*)    All other components within normal limits  C-REACTIVE PROTEIN - Abnormal; Notable for the following components:   CRP 19.3 (*)    All other components within normal limits  CULTURE, BLOOD (ROUTINE X 2)  CULTURE, BLOOD (ROUTINE X 2)  LACTIC ACID, PLASMA  PROCALCITONIN  TRIGLYCERIDES  LACTIC ACID, PLASMA    EKG EKG Interpretation  Date/Time:  Thursday November 07 2020 09:03:53 EST Ventricular Rate:  138 PR Interval:    QRS Duration: 95 QT Interval:  289 QTC Calculation: 424 R Axis:   -  33 Text Interpretation: Atrial fibrillation Left axis deviation Low voltage, extremity and precordial leads Consider anterior infarct now in afib since last ekg (hx of afib) Confirmed by Isla Pence [phone]) on 11/25/2020 9:28:59 AM   Radiology DG Chest Port 1 View  Result Date: 11/06/2020 CLINICAL DATA:  Dyspnea.  Weakness, shortness of breath and fevers. EXAM: PORTABLE CHEST 1 VIEW COMPARISON:  September 09, 2012. FINDINGS: Bilateral interstitial and airspace opacities. Low lung volumes. No visible pleural effusions or pneumothorax. Cardiac silhouette is largely obscured. No acute osseous abnormality. Bilateral shoulder degenerative change. IMPRESSION: Bilateral interstitial and airspace opacities, compatible with multifocal pneumonia. Electronically Signed   By: Margaretha Sheffield MD   On: 11/25/2020 09:59   Medications Ordered in ED Medications  dexamethasone (DECADRON) injection 10 mg (10 mg Intravenous Given 11/01/2020 0953)  acetaminophen (TYLENOL) tablet 650 mg (650 mg Oral Given 11/29/2020 9166)   ED Course  I have reviewed the triage vital signs and the nursing notes.  Pertinent labs & imaging results that were available during my care of the patient were reviewed by me and considered in my medical decision making (see chart for details).  EKG w/ afib. CXR w/ multifocal PNA. LDH significantly elevated at 695. WBC wnl. [place] 8200. Elevated LFTs > 2x upper  limit. Cr elevated to 1.21 indicating AKI. Inflammatory markers grosly elevated. D-dimer elevated c/f possible clot.   MDM Rules/Calculators/A&P                          Jill Cooke is a 79 y.o. female w/ PMHx of HTN, Afib, borderline diabetes and obesity presenting with weakness, SOB, and fever since 10/27/20 now known to be COVID positive.  Dyspneic and tachycardic on arrival, 91% on NRB. Oral temp of 99.20F Unable to fully complete HPI questioning. Exam remarkable for lung fields with wheezing and rales. CXR s/f multifocal PNA. Given decadron and tylenol x1. She is continued on 40 L with sats of 92%.  Patient w/ new oxygen requirement, COVID+, and concern for thrombosis will require admission. Hospitalist notified and will admit patient, plans to start heparin.   Called patient's family several times to update. Able to speak with godson and was redirected to call daughter which is POA and no answer. LVM with call back number. Hospitalist plans to get in touch with family as well.   Final Clinical Impression(s) / ED Diagnoses Final diagnoses:  Pneumonia due to COVID-19 virus  Acute respiratory failure with hypoxia [facility])    Rx / DC Orders ED Discharge Orders    None       Gerlene Fee, DO 10/30/2020 1231    Isla Pence, MD 11/02/2020 1334

## 2020-11-08 ENCOUNTER — Inpatient Hospital Stay (HOSPITAL_COMMUNITY): Payer: PPO

## 2020-11-08 LAB — COMPREHENSIVE METABOLIC PANEL
ALT: 78 U/L — ABNORMAL HIGH (ref 0–44)
AST: 111 U/L — ABNORMAL HIGH (ref 15–41)
Albumin: 2.6 g/dL — ABNORMAL LOW (ref 3.5–5.0)
Alkaline Phosphatase: 81 U/L (ref 38–126)
Anion gap: 15 (ref 5–15)
BUN: 33 mg/dL — ABNORMAL HIGH (ref 8–23)
CO2: 21 mmol/L — ABNORMAL LOW (ref 22–32)
Calcium: 7.9 mg/dL — ABNORMAL LOW (ref 8.9–10.3)
Chloride: 100 mmol/L (ref 98–111)
Creatinine, Ser: 1.35 mg/dL — ABNORMAL HIGH (ref 0.44–1.00)
GFR, Estimated: 40 mL/min — ABNORMAL LOW (ref >60)
Glucose, Bld: 173 mg/dL — ABNORMAL HIGH (ref 70–99)
Potassium: 3.9 mmol/L (ref 3.5–5.1)
Sodium: 136 mmol/L (ref 135–145)
Total Bilirubin: 0.6 mg/dL (ref 0.3–1.2)
Total Protein: 6.6 g/dL (ref 6.5–8.1)

## 2020-11-08 LAB — GLUCOSE, CAPILLARY
Glucose-Capillary: 181 mg/dL — ABNORMAL HIGH (ref 70–99)
Glucose-Capillary: 183 mg/dL — ABNORMAL HIGH (ref 70–99)

## 2020-11-08 LAB — PROCALCITONIN: Procalcitonin: 0.41 ng/mL

## 2020-11-08 LAB — CBC WITH DIFFERENTIAL/PLATELET
Abs Immature Granulocytes: 0.05 10*3/uL (ref 0.00–0.07)
Basophils Absolute: 0 10*3/uL (ref 0.0–0.1)
Basophils Relative: 0 %
Eosinophils Absolute: 0 10*3/uL (ref 0.0–0.5)
Eosinophils Relative: 0 %
HCT: 47 % — ABNORMAL HIGH (ref 36.0–46.0)
Hemoglobin: 15.4 g/dL — ABNORMAL HIGH (ref 12.0–15.0)
Immature Granulocytes: 1 %
Lymphocytes Relative: 8 %
Lymphs Abs: 0.5 10*3/uL — ABNORMAL LOW (ref 0.7–4.0)
MCH: 30.2 pg (ref 26.0–34.0)
MCHC: 32.8 g/dL (ref 30.0–36.0)
MCV: 92.2 fL (ref 80.0–100.0)
Monocytes Absolute: 0.2 10*3/uL (ref 0.1–1.0)
Monocytes Relative: 3 %
Neutro Abs: 5.5 10*3/uL (ref 1.7–7.7)
Neutrophils Relative %: 88 %
Platelets: 251 10*3/uL (ref 150–400)
RBC: 5.1 MIL/uL (ref 3.87–5.11)
RDW: 14 % (ref 11.5–15.5)
WBC: 6.3 10*3/uL (ref 4.0–10.5)
nRBC: 0 % (ref 0.0–0.2)

## 2020-11-08 LAB — HEMOGLOBIN A1C
Hgb A1c MFr Bld: 6.3 % — ABNORMAL HIGH (ref 4.8–5.6)
Mean Plasma Glucose: 134.11 mg/dL

## 2020-11-08 LAB — FERRITIN: Ferritin: 3170 ng/mL — ABNORMAL HIGH (ref 11–307)

## 2020-11-08 LAB — HEPARIN LEVEL (UNFRACTIONATED)
Heparin Unfractionated: 1.08 IU/mL — ABNORMAL HIGH (ref 0.30–0.70)
Heparin Unfractionated: 0.63 IU/mL (ref 0.30–0.70)
Heparin Unfractionated: 0.46 IU/mL (ref 0.30–0.70)

## 2020-11-08 LAB — D-DIMER, QUANTITATIVE: D-Dimer, Quant: 2.88 ug/mL-FEU — ABNORMAL HIGH (ref 0.00–0.50)

## 2020-11-08 LAB — C-REACTIVE PROTEIN: CRP: 17.9 mg/dL — ABNORMAL HIGH (ref <1.0)

## 2020-11-08 MED ORDER — HEPARIN (PORCINE) 25000 UT/250ML-% IV SOLN
900.0000 [IU]/h | INTRAVENOUS | Status: DC
  Administered 2020-11-09: 19:00:00 900 [IU]/h via INTRAVENOUS
  Filled 2020-11-08 (×2): qty 250

## 2020-11-08 MED ORDER — INSULIN ASPART 100 UNIT/ML ~~LOC~~ SOLN: 0.0000 [IU] | Freq: Every day | SUBCUTANEOUS | Status: DC

## 2020-11-08 MED ORDER — INSULIN ASPART 100 UNIT/ML ~~LOC~~ SOLN
0.0000 [IU] | Freq: Three times a day (TID) | SUBCUTANEOUS | Status: DC
  Administered 2020-11-08 – 2020-11-09 (×4): 3 [IU] via SUBCUTANEOUS
  Administered 2020-11-10: 13:00:00 8 [IU] via SUBCUTANEOUS
  Administered 2020-11-10 – 2020-11-12 (×8): 3 [IU] via SUBCUTANEOUS
  Administered 2020-11-13 (×2): 2 [IU] via SUBCUTANEOUS
  Administered 2020-11-13: 08:00:00 3 [IU] via SUBCUTANEOUS

## 2020-11-08 NOTE — Progress Notes (Addendum)
 PROGRESS NOTE  Jill Cooke  XBD:[id number] DOB: [birth year] DOA: 11/12/2020 PCP: Katherina Mires, MD   Brief Narrative: Jill Cooke is an 79 y.o. female with a history of atrial fibrillation on eliquis, and HTN who presented to the ED 12/9 with dyspnea and cough found to be severely hypoxic, tachypneic with multifocal infiltrates on CXR and positive SARS-CoV-2 PCR. CRP 19.3, d-dimer 4.7, PCT 0.54. Full code was confirmed, PCCM notified, though patient ultimately admitted to hospitalist service on 40L HHFNC O2, started on remdesivir, solumedrol, baricitinib, empiric antibiotics, and therapeutic heparin.   Assessment & Plan: Principal Problem:   Acute hypoxemic respiratory failure due to COVID-19 [facility]) Active Problems:   Atrial fibrillation with RVR (HCC)   Essential (primary) hypertension   Encephalopathy due to COVID-19 virus   AKI (acute kidney injury) ([place])   CKD (chronic kidney disease), stage III (HCC)   Hypokalemia  Acute hypoxemic respiratory failure due to covid-19 pneumonia and suspected superimposed bacterial CAP: SARS-CoV-2 PCR positive on 12/9. PCT elevated at admission.  - Continue remdesivir (12/9 - 12/13) - Continue steroids at ~1mg /kg q12h. CRP remains grossly elevated. - Continue baricitinib, started 12/9 due to patient's severity of illness.  - Plan to continue abx empirically x5 days. PCT responding.  - Encourage OOB, IS, FV, and awake proning if able - Continue airborne, contact precautions for 21 days from positive testing.  Elevated d-dimer: Possible pulmonary embolism. Remains too unstable for CTA and renal function worsened. - Check LE venous U/S - Continue therapeutic anticoagulation.  Atrial fibrillation with RVR:  - Restart home diltiazem and continue anticoagulation. Rate control is improving this afternoon. - prn labetalol  Hyperglycemia:  - Start CBG checks and SSI - HbA1c add-on  AKI on stage IIIa CKD:  - May need small volume resuscitation, but will  attempt po intake first to maintain restrictive fluid strategy in light of covid infection.  Acute hypoxic encephalopathy: Mentation improving with oxygen supplementation.  - Delirium precautions.  - Pt currently maintains capacity to make her own medical decisions. Confirms desire for full code and trial of intubation.  LFT elevation: Likely due to covid-19, improving.  - Monitor while on therapies as above.  HTN:  - Continue medications as above  Hypokalemia: Supplemented.  - Resolved, continue monitoring. Mg 2.2.   Morbid obesity: Estimated body mass index is 38.79 kg/m as calculated from the following:   Height as of this encounter: 5\' 2"  (1.575 m).   Weight as of this encounter: 96.2 kg.  DVT prophylaxis: IV heparin Code Status: Full Family Communication: None. Pt declines my repeated offers to call. Disposition Plan: Remain in ICU Status is: Inpatient  Remains inpatient appropriate because:Hemodynamically unstable and Inpatient level of care appropriate due to severity of illness  Dispo: The patient is from: Home              Anticipated d/c is to: TBD              Anticipated d/c date is: > 3 days              Patient currently is not medically stable to d/c.  Consultants:   None, PCCM aware of admission.  Procedures:   None  Antimicrobials:  Remdesivir  Ceftriaxone  Azithromycin   Subjective: Feels her shortness of breath is mild. When NRB taken off SpO2 drops into 70%'s so RN staff have recommended she not eat or drink much. She denies chest pain, leg swell, bleeding, palpitations.  Objective: Vitals:   11/08/20 0830 11/08/20 0900 11/08/20 1000 11/08/20 1248  BP:  133/77 119/70   Pulse:  (!) 123 (!) 109   Resp:  18 (!) 24   Temp: 98.2 F (36.8 C)   98.2 F (36.8 C)  TempSrc: Axillary   Axillary  SpO2: (!) 88% 91% (!) 84%   Weight:      Height:        Intake/Output Summary (Last 24 hours) at 11/08/2020 1337 Last data filed at 11/08/2020  0800 Gross per 24 hour  Intake 915.26 ml  Output 700 ml  Net 215.26 ml   Filed Weights   11/28/2020 0908 11/16/2020 1706  Weight: 102.1 kg 96.2 kg   Gen: Elderly obese female in no distress Pulm: Non-labored tachypnea on HHF + NRB. Diminished globally. CV: Irreg irreg. No murmur, rub, or gallop. No definite JVD, no pitting pedal edema. GI: Abdomen soft, non-tender, non-distended, with normoactive bowel sounds. No organomegaly or masses felt. Ext: Warm, no deformities Skin: No rashes, lesions or ulcers on visualized skin Neuro: Alert and oriented. No focal neurological deficits. Psych: Judgement and insight appear normal. Mood & affect appropriate.   Data Reviewed: I have personally reviewed following labs and imaging studies  CBC: Recent Labs  Lab 11/01/2020 0936 11/08/20 0043  WBC 9.1 6.3  NEUTROABS 8.2* 5.5  HGB 16.5* 15.4*  HCT 48.9* 47.0*  MCV 90.7 92.2  PLT 264 678   Basic Metabolic Panel: Recent Labs  Lab 11/25/2020 0936 11/01/2020 1357 11/08/20 0043  NA 135  --  136  K 3.4*  --  3.9  CL 96*  --  100  CO2 25  --  21*  GLUCOSE 113*  --  173*  BUN 28*  --  33*  CREATININE 1.21*  --  1.35*  CALCIUM 8.3*  --  7.9*  MG  --  2.2  --    GFR: Estimated Creatinine Clearance: 35.9 mL/min (A) (by C-G formula based on SCr of 1.35 mg/dL (H)). Liver Function Tests: Recent Labs  Lab 11/12/2020 0936 11/08/20 0043  AST 179* 111*  ALT 109* 78*  ALKPHOS 98 81  BILITOT 1.1 0.6  PROT 7.5 6.6  ALBUMIN 3.1* 2.6*   No results for input(s): LIPASE, AMYLASE in the last 168 hours. No results for input(s): AMMONIA in the last 168 hours. Coagulation Profile: Recent Labs  Lab 11/12/2020 1357  INR 1.2   Cardiac Enzymes: No results for input(s): CKTOTAL, CKMB, CKMBINDEX, TROPONINI in the last 168 hours. BNP (last 3 results) No results for input(s): PROBNP in the last 8760 hours. HbA1C: No results for input(s): HGBA1C in the last 72 hours. CBG: No results for input(s): GLUCAP in  the last 168 hours. Lipid Profile: Recent Labs    11/12/2020 0936  TRIG 92   Thyroid Function Tests: No results for input(s): TSH, T4TOTAL, FREET4, T3FREE, THYROIDAB in the last 72 hours. Anemia Panel: Recent Labs    11/21/2020 0936 11/08/20 0043  FERRITIN 3,653* 3,170*   Urine analysis: No results found for: COLORURINE, APPEARANCEUR, LABSPEC, PHURINE, GLUCOSEU, HGBUR, BILIRUBINUR, KETONESUR, PROTEINUR, UROBILINOGEN, NITRITE, LEUKOCYTESUR Recent Results (from the past 240 hour(s))  Resp Panel by RT-PCR (Flu A&B, Covid) Nasopharyngeal Swab     Status: Abnormal   Collection Time: 11/22/2020  9:10 AM   Specimen: Nasopharyngeal Swab; Nasopharyngeal(NP) swabs in vial transport medium  Result Value Ref Range Status   SARS Coronavirus 2 by RT PCR POSITIVE (A) NEGATIVE Final    Comment: RESULT CALLED  TO, READ BACK BY AND VERIFIED WITH: DR Erskine Speed LOTT @1139  ON [id number] BY COHENK (NOTE) SARS-CoV-2 target nucleic acids are DETECTED.  The SARS-CoV-2 RNA is generally detectable in upper respiratory specimens during the acute phase of infection. Positive results are indicative of the presence of the identified virus, but do not rule out bacterial infection or co-infection with other pathogens not detected by the test. Clinical correlation with patient history and other diagnostic information is necessary to determine patient infection status. The expected result is Negative.  Fact Sheet for Patients: EntrepreneurPulse.com.au  Fact Sheet for Healthcare Providers: IncredibleEmployment.be  This test is not yet approved or cleared by the [place] FDA and  has been authorized for detection and/or diagnosis of SARS-CoV-2 by FDA under an Emergency Use Authorization (EUA).  This EUA will remain in effect (meaning this tes t can be used) for the duration of  the COVID-19 declaration under Section 564(b)(1) of the Act, 21 U.S.C. section 360bbb-3(b)(1), unless  the authorization is terminated or revoked sooner.     Influenza A by PCR NEGATIVE NEGATIVE Final   Influenza B by PCR NEGATIVE NEGATIVE Final    Comment: (NOTE) The Xpert Xpress SARS-CoV-2/FLU/RSV plus assay is intended as an aid in the diagnosis of influenza from Nasopharyngeal swab specimens and should not be used as a sole basis for treatment. Nasal washings and aspirates are unacceptable for Xpert Xpress SARS-CoV-2/FLU/RSV testing.  Fact Sheet for Patients: EntrepreneurPulse.com.au  Fact Sheet for Healthcare Providers: IncredibleEmployment.be  This test is not yet approved or cleared by the [place] FDA and has been authorized for detection and/or diagnosis of SARS-CoV-2 by FDA under an Emergency Use Authorization (EUA). This EUA will remain in effect (meaning this test can be used) for the duration of the COVID-19 declaration under Section 564(b)(1) of the Act, 21 U.S.C. section 360bbb-3(b)(1), unless the authorization is terminated or revoked.  Performed at [facility], [place] [address], [place], [place] [address]   Blood Culture (routine x 2)     Status: None (Preliminary result)   Collection Time: 11/16/2020  9:10 AM   Specimen: BLOOD  Result Value Ref Range Status   Specimen Description   Final    BLOOD RIGHT ANTECUBITAL Performed at [place] [address] [place], [place] [address]    Special Requests   Final    BOTTLES DRAWN AEROBIC AND ANAEROBIC Blood Culture results may not be optimal due to an inadequate volume of blood received in culture bottles Performed at [place] [address], [place], [place] [address]    Culture   Final    NO GROWTH < 24 HOURS Performed at [place] [address] [place], [place] [address]    Report Status PENDING  Incomplete  Blood Culture (routine x 2)     Status: None (Preliminary result)   Collection Time:  11/29/2020  9:41 AM   Specimen: BLOOD  Result Value Ref Range Status   Specimen Description   Final    BLOOD BLOOD RIGHT FOREARM Performed at [place] [address], [place], [place] [address]    Special Requests   Final    BOTTLES DRAWN AEROBIC AND ANAEROBIC Blood Culture results may not be optimal due to an inadequate volume of blood received in culture bottles Performed at [facility] [address], [place], [place] [address]    Culture   Final    NO GROWTH < 24  HOURS Performed at [facility], [place] [address], [place], [place] [address]    Report Status PENDING  Incomplete  MRSA PCR Screening     Status: None   Collection Time:   5:42 PM   Specimen: Nasal Mucosa; Nasopharyngeal  Result Value Ref Range Status   MRSA by PCR NEGATIVE NEGATIVE Final    Comment:        The GeneXpert MRSA Assay (FDA approved for NASAL specimens only), is one component of a comprehensive MRSA colonization surveillance program. It is not intended to diagnose MRSA infection nor to guide or monitor treatment for MRSA infections. Performed at [facility], [place] [address], [place], [place] [address]       Radiology Studies: [facility] Chest Port 1 View  Result Date: 11/04/2020 CLINICAL DATA:  Dyspnea.  Weakness, shortness of breath and fevers. EXAM: PORTABLE CHEST 1 VIEW COMPARISON:  September 09, 2012. FINDINGS: Bilateral interstitial and airspace opacities. Low lung volumes. No visible pleural effusions or pneumothorax. Cardiac silhouette is largely obscured. No acute osseous abnormality. Bilateral shoulder degenerative change. IMPRESSION: Bilateral interstitial and airspace opacities, compatible with multifocal pneumonia. Electronically Signed   By: Margaretha Sheffield MD   On: 11/15/2020 09:59    Scheduled Meds: . baricitinib  2 mg Oral Daily  . chlorhexidine  15 mL Mouth Rinse BID  . Chlorhexidine Gluconate Cloth  6 each Topical  Daily  . diltiazem  240 mg Oral Daily  . Ipratropium-Albuterol  1 puff Inhalation Q6H  . mouth rinse  15 mL Mouth Rinse q12n4p  . methylPREDNISolone (SOLU-MEDROL) injection  100 mg Intravenous Q12H   Followed by  . [START ON 11/10/2020] predniSONE  50 mg Oral Daily  . sodium chloride flush  3 mL Intravenous Q12H   Continuous Infusions: . azithromycin 500 mg (11/08/20 1225)  . cefTRIAXone (ROCEPHIN)  IV Stopped (11/16/2020 1357)  . heparin 900 Units/hr (11/08/20 0800)  . remdesivir 100 mg in NS 100 mL Stopped (11/08/20 0853)     LOS: 1 day   Time spent: 35 minutes.  Patrecia Pour, MD [facility] [email or web address] 11/08/2020, 1:37 PM

## 2020-11-08 NOTE — Progress Notes (Signed)
Lower extremity venous bilateral study completed.   Please see CV Proc for preliminary results.   Esraa Seres, RDMS  

## 2020-11-08 NOTE — Progress Notes (Signed)
ANTICOAGULATION CONSULT NOTE - Follow Up Consult  Pharmacy Consult for Heparin IV Indication: suspected PE, empiric treatment  No Known Allergies  Patient Measurements: Height: 5\' 2"  (157.5 cm) Weight: 96.2 kg (212 lb 1.3 oz) IBW/kg (Calculated) : 50.1 Heparin Dosing Weight: 73 kg  Vital Signs: Temp: 97.6 F (36.4 C) (12/10 2000) Temp Source: Oral (12/10 2000) BP: 97/66 (12/10 1600) Pulse Rate: 85 (12/10 2103)  Labs: Recent Labs    11/08/2020 0936 11/28/2020 1307 11/18/2020 1357 11/08/20 0043 11/08/20 1144 11/08/20 2109  HGB 16.5*  --   --  15.4*  --   --   HCT 48.9*  --   --  47.0*  --   --   PLT 264  --   --  251  --   --   APTT  --   --  31  --   --   --   LABPROT  --   --  14.8  --   --   --   INR  --   --  1.2  --   --   --   HEPARINUNFRC  --    < >  --  1.08* 0.63 0.46  CREATININE 1.21*  --   --  1.35*  --   --    < > = values in this interval not displayed.    Estimated Creatinine Clearance: 35.9 mL/min (A) (by C-G formula based on SCr of 1.35 mg/dL (H)).   Medications:  Infusions:  . azithromycin Stopped (11/08/20 1325)  . cefTRIAXone (ROCEPHIN)  IV Stopped (11/08/20 1430)  . heparin 900 Units/hr (11/08/20 0800)  . remdesivir 100 mg in NS 100 mL Stopped (11/08/20 0932)    Assessment: 2 yoF admitted on 12/9 with COVID pneumonia.  PMH includes AFib on chronic apixaban anticoagulation.  Pharmacy is consulted to dose heparin for empiric treatment of suspected PE.  CT unable to be obtained.  Med rec is in process of completion but pt's daughter states that pt has not had  Eliquis in > 1 week as pt has not been feeling well.   Baseline labs : INR 1.2, PT 14.8, aPTT 31   HL is < 0.10, confirming  no recent apixaban intake    Today, 11/08/2020: 1144: Heparin level 0.63 on heparin 900 units/hr CBC: Hgb decreased to 15.4.    Admission Hgb elevated but suspect baseline ~14 (02/2020, 08/2020).  Plt WNL SCr increased 1.2 > 1.35 D-dimer decreased to 2.8 2109:  Heparin level 0.46 units/ml on 900 units/hr  Goal of Therapy:  Heparin level 0.3-0.7 units/ml Monitor platelets by anticoagulation protocol: Yes   Plan:  Continue heparin IV infusion at 900 units/hr Daily heparin level and CBC Follow up imaging if/when available  Minda Ditto PharmD 11/08/2020, 9:56 PM

## 2020-11-08 NOTE — TOC Initial Note (Signed)
Transition of Care [facility]) - Initial/Assessment Note    Patient Details  Name: Jill Cooke MRN: [id number] Date of Birth: 07/10/1941  [facility] CM/SW Contact:    Leeroy Cha, RN Phone Number: 11/08/2020, 8:08 AM  Clinical Narrative:                  79 year old female who is not vaccinated against COVID-19 with a past medical history of atrial fibrillation on Eliquis and hypertension who presented to the ED with weakness, shortness of breath and fever since 10/27/2020.  Patient's husband was initially sick the day after Thanksgiving with intermittent fevers and patient subsequently became sick sometime later.  Currently she denies any other symptoms other than malaise.  She denies any known history of VTE, COPD or asthma. PLAN: to return to home [place] for progression.  HFNRB at 40l/min. Expected Discharge Plan: Home/Self Care Barriers to Discharge: No Barriers Identified   Patient Goals and CMS Choice Patient states their goals for this hospitalization and ongoing recovery are:: to go home CMS Medicare.gov Compare Post Acute Care list provided to:: Patient Choice offered to / list presented to : Patient  Expected Discharge Plan and Services Expected Discharge Plan: Home/Self Care   Discharge Planning Services: CM Consult   Living arrangements for the past 2 months: Single Family Home                                      Prior Living Arrangements/Services Living arrangements for the past 2 months: Single Family Home Lives with:: Spouse Patient language and need for interpreter reviewed:: Yes Do you feel safe going back to the place where you live?: Yes      Need for Family Participation in Patient Care: Yes (Comment) Care giver support system in place?: Yes (comment)   Criminal Activity/Legal Involvement Pertinent to Current Situation/Hospitalization: No - Comment as needed  Activities of Daily Living Home Assistive Devices/Equipment:  Eyeglasses ADL Screening (condition at time of admission) Patient's cognitive ability adequate to safely complete daily activities?: Yes Is the patient deaf or have difficulty hearing?: Yes Does the patient have difficulty seeing, even when wearing glasses/contacts?: No Does the patient have difficulty concentrating, remembering, or making decisions?: No Patient able to express need for assistance with ADLs?: Yes Does the patient have difficulty dressing or bathing?: Yes Independently performs ADLs?: No Communication: Independent Dressing (OT): Needs assistance Is this a change from baseline?: Change from baseline, expected to last >3 days Grooming: Needs assistance Is this a change from baseline?: Change from baseline, expected to last >3 days Feeding: Needs assistance Is this a change from baseline?: Change from baseline, expected to last >3 days Bathing: Needs assistance Is this a change from baseline?: Change from baseline, expected to last >3 days Toileting: Needs assistance Is this a change from baseline?: Change from baseline, expected to last >3days In/Out Bed: Needs assistance Is this a change from baseline?: Change from baseline, expected to last >3 days Walks in Home: Needs assistance Is this a change from baseline?: Change from baseline, expected to last >3 days Does the patient have difficulty walking or climbing stairs?: Yes (secondary to shortness of breath and weakness) Weakness of Legs: Both Weakness of Arms/Hands: Both  Permission Sought/Granted                  Emotional Assessment Appearance:: Appears stated age Attitude/Demeanor/Rapport: Engaged Affect (typically  observed): Calm Orientation: : Oriented to Place,Oriented to Self,Oriented to  Time,Oriented to Situation Alcohol / Substance Use: Not Applicable Psych Involvement: No (comment)  Admission diagnosis:  Acute respiratory failure with hypoxia ([place]) [J96.01] Acute hypoxemic respiratory failure  due to COVID-19 ([place]) [U07.1, J96.01] Pneumonia due to COVID-19 virus [U07.1, J12.82] Patient Active Problem List   Diagnosis Date Noted  . Acute hypoxemic respiratory failure due to COVID-19 ([place]) 11/28/2020  . Encephalopathy due to COVID-19 virus 11/08/2020  . AKI (acute kidney injury) ([place]) 11/05/2020  . CKD (chronic kidney disease), stage III ([place]) 11/29/2020  . Hypokalemia 11/26/2020  . Edema leg 10/22/2014  . Adult BMI 30+ 10/22/2014  . Borderline diabetes mellitus 10/22/2014  . Atrial fibrillation with RVR ([place]) 04/26/2014  . Essential (primary) hypertension 04/26/2014  . Endometrial cancer ([place]) 08/18/2012  . PAF (paroxysmal atrial fibrillation) ([place]) 07/20/2012  . Post-menopausal bleeding 07/07/2012   PCP:  Katherina Mires, MD Pharmacy:   [id number] [place], [place] [address] Phone: [phone] Fax: [phone]     Social Determinants of Health (SDOH) Interventions    Readmission Risk Interventions No flowsheet data found.

## 2020-11-08 NOTE — Progress Notes (Signed)
ANTICOAGULATION CONSULT NOTE - Follow Up Consult  Pharmacy Consult for Heparin IV Indication: suspected PE, empiric treatment  No Known Allergies  Patient Measurements: Height: 5\' 2"  (157.5 cm) Weight: 96.2 kg (212 lb 1.3 oz) IBW/kg (Calculated) : 50.1 Heparin Dosing Weight: 73 kg  Vital Signs: Temp: 98.3 F (36.8 C) (12/10 0400) Temp Source: Axillary (12/10 0400) BP: 119/59 (12/10 0501) Pulse Rate: 103 (12/10 0546)  Labs: Recent Labs    11/22/2020 0936 11/06/2020 1307 11/06/2020 1357 11/08/20 0043  HGB 16.5*  --   --  15.4*  HCT 48.9*  --   --  47.0*  PLT 264  --   --  251  APTT  --   --  31  --   LABPROT  --   --  14.8  --   INR  --   --  1.2  --   HEPARINUNFRC  --  <0.10*  --  1.08*  CREATININE 1.21*  --   --  1.35*    Estimated Creatinine Clearance: 35.9 mL/min (A) (by C-G formula based on SCr of 1.35 mg/dL (H)).   Medications:  Infusions:  . azithromycin Stopped (11/20/2020 1531)  . cefTRIAXone (ROCEPHIN)  IV Stopped (10/31/2020 1357)  . heparin 900 Units/hr (11/08/20 0405)  . remdesivir 100 mg in NS 100 mL      Assessment: 79 yoF admitted on 12/9 with COVID pneumonia.  PMH includes AFib on chronic apixaban anticoagulation.  Pharmacy is consulted to dose heparin for empiric treatment of suspected PE.  CT unable to be obtained.  Med rec is in process of completion but pt's daughter states that pt has not had  Eliquis in > 1 week as pt has not been feeling well.   Baseline labs : INR 1.2, PT 14.8, aPTT 31   HL is < 0.10, confirming  no recent apixaban intake    Today, 11/08/2020: Heparin level 0.63 on heparin 900 units/hr CBC: Hgb decreased to 15.4.    Admission Hgb elevated but suspect baseline ~14 (02/2020, 08/2020).  Plt WNL SCr increased 1.2 > 1.35 D-dimer decreased to 2.8  Goal of Therapy:  Heparin level 0.3-0.7 units/ml Monitor platelets by anticoagulation protocol: Yes   Plan:  Continue heparin IV infusion at 900 units/hr Heparin level 8 hours for  confirmatory level Daily heparin level and CBC Follow up imaging if/when available   Gretta Arab PharmD, BCPS Clinical Pharmacist [facility] main pharmacy [phone] 11/08/2020 7:29 AM

## 2020-11-08 NOTE — Progress Notes (Signed)
ANTICOAGULATION CONSULT NOTE   Pharmacy Consult for IV heparin Indication: empiric VTE treatment   No Known Allergies  Patient Measurements: Height: 5\' 2"  (157.5 cm) Weight: 96.2 kg (212 lb 1.3 oz) IBW/kg (Calculated) : 50.1 HEPARIN DW (KG): 72.7   Vital Signs: Temp: 97.3 F (36.3 C) (12/10 0000) Temp Source: Axillary (12/10 0000) BP: 109/49 (12/10 0200) Pulse Rate: 79 (12/10 0200)  Labs: Recent Labs    11/29/2020 0936 11/01/2020 1307 11/10/2020 1357 11/08/20 0043  HGB 16.5*  --   --  15.4*  HCT 48.9*  --   --  47.0*  PLT 264  --   --  251  APTT  --   --  31  --   LABPROT  --   --  14.8  --   INR  --   --  1.2  --   HEPARINUNFRC  --  <0.10*  --  1.08*  CREATININE 1.21*  --   --  1.35*    Estimated Creatinine Clearance: 35.9 mL/min (A) (by C-G formula based on SCr of 1.35 mg/dL (H)).   Medical History: Past Medical History:  Diagnosis Date  . Anxiety    with  diagnosis  . Cancer ([place])    ENDOMETRIAL  . Endometrial hyperplasia   . PAF (paroxysmal atrial fibrillation) ([place])    a. 06/2012 Admitted with asymp afib->converted on IV dilt.  No asa/coumadin 2/2 h/o vaginal bleeding pending d/c;  b. 07/20/2012 Echo:  EF 60-65%, Gr2 DD, mild LVH   . Postmenopausal bleeding   . Retroversion of uterus   . Vaginal bleeding   . Vulvar ulceration     Medications:  Scheduled:  . baricitinib  2 mg Oral Daily  . chlorhexidine  15 mL Mouth Rinse BID  . Chlorhexidine Gluconate Cloth  6 each Topical Daily  . diltiazem  240 mg Oral Daily  . Ipratropium-Albuterol  1 puff Inhalation Q6H  . mouth rinse  15 mL Mouth Rinse q12n4p  . methylPREDNISolone (SOLU-MEDROL) injection  100 mg Intravenous Q12H   Followed by  . [START ON 11/10/2020] predniSONE  50 mg Oral Daily  . sodium chloride flush  3 mL Intravenous Q12H    Assessment: Pharmacy is consulted to dose heparin in 79 yo female that is COVID positive. Per consult, pt is being empirically treated for PE as a CT has been unable  to be obtained. Pt is listed as being on apixaban PTA for afib. Med rec is in process of completion but pt's daughter states that pt has not had  Eliquis in > 1 week as pt has not been feeling well.   Baseline labs : INR 1.2, PT 14.8, aPTT 31   HL is < 0.10, confirming  no recent apixaban intake    Scr 1.21 mg/dl, CrCl 41 ml/min   D-Dimer 4.76  11/08/2020:  Initial Heparin level 1.08- elevated on IV heparin @ 1200 units/hr  CBC: WNL  Scr 1.35- trending up  No bleeding or infusion related issues reported by RN  Verified lab drawn peripherally from left hand- opposite from heparin on right.    Goal of Therapy:  Heparin level 0.3-0.7 units/ml Monitor platelets by anticoagulation protocol: Yes   Plan:   Hold heparin x1 hour  Resume heparin at reduced rate of 900 units/hr  Obtain HL 8 hours after re-start of infusion   Daily CBC   Netta Cedars, PharmD, BCPS 11/08/2020 2:52 AM

## 2020-11-09 ENCOUNTER — Inpatient Hospital Stay (HOSPITAL_COMMUNITY): Payer: PPO

## 2020-11-09 DIAGNOSIS — J9601 Acute respiratory failure with hypoxia: Secondary | ICD-10-CM

## 2020-11-09 LAB — CBC WITH DIFFERENTIAL/PLATELET
Abs Immature Granulocytes: 0.07 10*3/uL (ref 0.00–0.07)
Basophils Absolute: 0 10*3/uL (ref 0.0–0.1)
Basophils Relative: 0 %
Eosinophils Absolute: 0 10*3/uL (ref 0.0–0.5)
Eosinophils Relative: 0 %
HCT: 42.1 % (ref 36.0–46.0)
Hemoglobin: 13.9 g/dL (ref 12.0–15.0)
Immature Granulocytes: 1 %
Lymphocytes Relative: 7 %
Lymphs Abs: 0.7 10*3/uL (ref 0.7–4.0)
MCH: 30.2 pg (ref 26.0–34.0)
MCHC: 33 g/dL (ref 30.0–36.0)
MCV: 91.3 fL (ref 80.0–100.0)
Monocytes Absolute: 0.4 10*3/uL (ref 0.1–1.0)
Monocytes Relative: 4 %
Neutro Abs: 8 10*3/uL — ABNORMAL HIGH (ref 1.7–7.7)
Neutrophils Relative %: 88 %
Platelets: 329 10*3/uL (ref 150–400)
RBC: 4.61 MIL/uL (ref 3.87–5.11)
RDW: 13.8 % (ref 11.5–15.5)
WBC: 9.2 10*3/uL (ref 4.0–10.5)
nRBC: 0 % (ref 0.0–0.2)

## 2020-11-09 LAB — COMPREHENSIVE METABOLIC PANEL
ALT: 68 U/L — ABNORMAL HIGH (ref 0–44)
AST: 80 U/L — ABNORMAL HIGH (ref 15–41)
Albumin: 2.6 g/dL — ABNORMAL LOW (ref 3.5–5.0)
Alkaline Phosphatase: 81 U/L (ref 38–126)
Anion gap: 13 (ref 5–15)
BUN: 43 mg/dL — ABNORMAL HIGH (ref 8–23)
CO2: 23 mmol/L (ref 22–32)
Calcium: 8.1 mg/dL — ABNORMAL LOW (ref 8.9–10.3)
Chloride: 102 mmol/L (ref 98–111)
Creatinine, Ser: 1.4 mg/dL — ABNORMAL HIGH (ref 0.44–1.00)
GFR, Estimated: 38 mL/min — ABNORMAL LOW (ref >60)
Glucose, Bld: 186 mg/dL — ABNORMAL HIGH (ref 70–99)
Potassium: 3.1 mmol/L — ABNORMAL LOW (ref 3.5–5.1)
Sodium: 138 mmol/L (ref 135–145)
Total Bilirubin: 0.6 mg/dL (ref 0.3–1.2)
Total Protein: 6.4 g/dL — ABNORMAL LOW (ref 6.5–8.1)

## 2020-11-09 LAB — GLUCOSE, CAPILLARY
Glucose-Capillary: 183 mg/dL — ABNORMAL HIGH (ref 70–99)
Glucose-Capillary: 198 mg/dL — ABNORMAL HIGH (ref 70–99)
Glucose-Capillary: 167 mg/dL — ABNORMAL HIGH (ref 70–99)

## 2020-11-09 LAB — FERRITIN: Ferritin: 2665 ng/mL — ABNORMAL HIGH (ref 11–307)

## 2020-11-09 LAB — ECHOCARDIOGRAM COMPLETE
Height: 62 in
S' Lateral: 2.3 cm
Weight: 3393.32 oz

## 2020-11-09 LAB — D-DIMER, QUANTITATIVE: D-Dimer, Quant: 1.34 ug/mL-FEU — ABNORMAL HIGH (ref 0.00–0.50)

## 2020-11-09 LAB — HEPARIN LEVEL (UNFRACTIONATED): Heparin Unfractionated: 0.36 IU/mL (ref 0.30–0.70)

## 2020-11-09 LAB — C-REACTIVE PROTEIN: CRP: 10.4 mg/dL — ABNORMAL HIGH (ref <1.0)

## 2020-11-09 MED ORDER — POTASSIUM CHLORIDE CRYS ER 20 MEQ PO TBCR
40.0000 meq | EXTENDED_RELEASE_TABLET | Freq: Two times a day (BID) | ORAL | Status: AC
  Administered 2020-11-09 (×2): 40 meq via ORAL
  Filled 2020-11-09 (×2): qty 2

## 2020-11-09 MED ORDER — LINAGLIPTIN 5 MG PO TABS
5.0000 mg | ORAL_TABLET | Freq: Every day | ORAL | Status: DC
  Administered 2020-11-09 – 2020-11-13 (×5): 5 mg via ORAL
  Filled 2020-11-09 (×5): qty 1

## 2020-11-09 NOTE — Progress Notes (Signed)
ANTICOAGULATION CONSULT NOTE - Follow Up Consult  Pharmacy Consult for Heparin IV Indication: suspected PE, empiric treatment  No Known Allergies  Patient Measurements: Height: 5\' 2"  (157.5 cm) Weight: 96.2 kg (212 lb 1.3 oz) IBW/kg (Calculated) : 50.1 Heparin Dosing Weight: 73 kg  Vital Signs: Temp: 97.6 F (36.4 C) (12/10 2000) Temp Source: Oral (12/10 2000) BP: 100/66 (12/10 2212) Pulse Rate: 90 (12/11 0430)  Labs: Recent Labs    11/03/2020 0936 11/09/2020 1307 11/20/2020 1357 11/08/20 0043 11/08/20 1144 11/08/20 2109 11/09/20 0253  HGB 16.5*  --   --  15.4*  --   --  13.9  HCT 48.9*  --   --  47.0*  --   --  42.1  PLT 264  --   --  251  --   --  329  APTT  --   --  31  --   --   --   --   LABPROT  --   --  14.8  --   --   --   --   INR  --   --  1.2  --   --   --   --   HEPARINUNFRC  --    < >  --  1.08* 0.63 0.46 0.36  CREATININE 1.21*  --   --  1.35*  --   --  1.40*   < > = values in this interval not displayed.    Estimated Creatinine Clearance: 34.7 mL/min (A) (by C-G formula based on SCr of 1.4 mg/dL (H)).   Medications:  Infusions:  . azithromycin Stopped (11/08/20 1325)  . cefTRIAXone (ROCEPHIN)  IV Stopped (11/08/20 1430)  . heparin 900 Units/hr (11/08/20 0800)  . remdesivir 100 mg in NS 100 mL Stopped (11/08/20 2683)    Assessment: 53 yoF admitted on 12/9 with COVID pneumonia.  PMH includes AFib on chronic apixaban anticoagulation.  Med rec is in process of completion but pt's daughter states that pt has not had  Eliquis in > 1 week as pt has not been feeling well. Pharmacy is consulted to dose heparin for empiric treatment of suspected PE.  CT unable to be obtained.   Baseline labs : INR 1.2, PT 14.8, aPTT 31, HL is < 0.10, confirming  no recent apixaban intake    12/10 Vascular US: negative for DVT  Today, 11/09/2020: Heparin level 0.36 on heparin 900 units/hr CBC: Hgb decreased to 13.9.  Admission Hgb elevated but suspect baseline is closer to ~14  (02/2020, 08/2020).  Plt WNL No bleeding or complications reported. SCr increased 1.2 > 1.35 > 1.4 D-dimer decreased to 1.3  Goal of Therapy:  Heparin level 0.3-0.7 units/ml Monitor platelets by anticoagulation protocol: Yes   Plan:  Continue heparin IV infusion at 900 units/hr Daily heparin level and CBC Follow up imaging if/when available, long-term anticoagulation plan   Gretta Arab PharmD, BCPS Clinical Pharmacist [facility] main pharmacy [phone] 11/09/2020 7:06 AM

## 2020-11-09 NOTE — Progress Notes (Addendum)
 PROGRESS NOTE  Jill Cooke  LGX:[id number] DOB: 1941-05-07 DOA: 11/25/2020 PCP: Katherina Mires, MD   Brief Narrative: Jill Cooke is an 79 y.o. female with a history of atrial fibrillation on eliquis, and HTN who presented to the ED 12/9 with dyspnea and cough found to be severely hypoxic, tachypneic with multifocal infiltrates on CXR and positive SARS-CoV-2 PCR. CRP 19.3, d-dimer 4.7, PCT 0.54. Full code was confirmed, PCCM notified, though patient ultimately admitted to hospitalist service on 40L HHFNC O2, started on remdesivir, solumedrol, baricitinib, empiric antibiotics, and therapeutic heparin. Remains at high risk of intubation.  Assessment & Plan: Principal Problem:   Acute hypoxemic respiratory failure due to COVID-19 [facility]) Active Problems:   Atrial fibrillation with RVR (HCC)   Essential (primary) hypertension   Encephalopathy due to COVID-19 virus   AKI (acute kidney injury) ([place])   CKD (chronic kidney disease), stage III (HCC)   Hypokalemia  Acute hypoxemic respiratory failure due to covid-19 pneumonia and suspected superimposed bacterial CAP: SARS-CoV-2 PCR positive on 12/9. PCT elevated at admission.  - Continue remdesivir (12/9 - 12/13) - Continue steroids at ~1mg /kg q12h. CRP remains grossly elevated but beginning to respond. - Continue baricitinib, started 12/9 due to patient's severity of illness.  - Plan to continue abx empirically x5 days. PCT responding.  - Encourage OOB, IS, FV, and awake proning if able - Continue airborne, contact precautions for 21 days from positive testing.  Elevated d-dimer: Possible pulmonary embolism. Remains too unstable for CTA and renal function worsened. - Preliminarily negative LE venous U/S - Continue therapeutic anticoagulation empirically for now.  Check echo.  Atrial fibrillation with RVR:  - Restarted home diltiazem and continue anticoagulation. Rate control improved. - prn labetalol  Prediabetes with steroid-induced  hyperglycemia: HbA1c 6.3%. - Start CBG checks and SSI - Add linagliptin  AKI on stage IIIa CKD:  - Pt reportedly taking adequate po intake, though creatinine slightly risen again, will defer IVF for today, but may need low volume maintenance support, for now continuing restrictive fluid strategy in light of covid infection.  Acute hypoxic encephalopathy: Mentation improving with oxygen supplementation.  - Delirium precautions.  - Pt currently maintains capacity to make her own medical decisions. Confirms desire for full code and trial of intubation.  LFT elevation: Likely due to covid-19, improving.  - Monitor while on therapies as above.  HTN:  - Continue medications as above  Hypokalemia: Supplemented.  - Resolved, continue monitoring. Mg 2.2.   Morbid obesity: Estimated body mass index is 38.79 kg/m as calculated from the following:   Height as of this encounter: 5\' 2"  (1.575 m).   Weight as of this encounter: 96.2 kg.  DVT prophylaxis: IV heparin Code Status: Full Family Communication: None. Again declines offer to call, states she's updating family.  Disposition Plan: Remain in ICU Status is: Inpatient  Remains inpatient appropriate because:Hemodynamically unstable and Inpatient level of care appropriate due to severity of illness  Dispo: The patient is from: Home              Anticipated d/c is to: TBD              Anticipated d/c date is: > 3 days              Patient currently is not medically stable to d/c.  Consultants:   None, PCCM aware of admission.  Procedures:   None  Antimicrobials:  Remdesivir  Ceftriaxone  Azithromycin   Subjective: Nervous about getting  worse but states she feels about the same. Takes NRB off quickly to take po and says she's eating and drinking adequately. No chest pain or leg swelling/tenderness.   Objective: Vitals:   11/09/20 0755 11/09/20 0800 11/09/20 0848 11/09/20 0956  BP:  114/72  109/67  Pulse:  90 95   Resp:   17 (!) 24   Temp: 97.7 F (36.5 C)     TempSrc: Axillary     SpO2:  (!) 89% 90%   Weight:      Height:        Intake/Output Summary (Last 24 hours) at 11/09/2020 1046 Last data filed at 11/09/2020 0600 Gross per 24 hour  Intake 377.26 ml  Output 1250 ml  Net -872.74 ml   Filed Weights   11/03/2020 0908 10/30/2020 1706  Weight: 102.1 kg 96.2 kg   Gen: Frail elderly female in no distress Pulm: Nonlabored but tachypneic with SpO2 in high 80%'s on HHF 55L and NRB, diminished with some crackles throughout CV: Regular borderline tachycardia. No murmur, rub, or gallop. No JVD, no pitting dependent edema, in fact legs appears to have contracted skin from previous. GI: Abdomen soft, non-tender, non-distended, with normoactive bowel sounds.  Ext: Warm, no deformities Skin: No rashes, lesions or ulcers on visualized skin. Neuro: Alert and oriented. No focal neurological deficits. Psych: Judgement and insight appear fair. Mood euthymic & affect congruent. Behavior is appropriate.    Data Reviewed: I have personally reviewed following labs and imaging studies  CBC: Recent Labs  Lab 11/15/2020 0936 11/08/20 0043 11/09/20 0253  WBC 9.1 6.3 9.2  NEUTROABS 8.2* 5.5 8.0*  HGB 16.5* 15.4* 13.9  HCT 48.9* 47.0* 42.1  MCV 90.7 92.2 91.3  PLT 264 251 570   Basic Metabolic Panel: Recent Labs  Lab 11/08/2020 0936 11/18/2020 1357 11/08/20 0043 11/09/20 0253  NA 135  --  136 138  K 3.4*  --  3.9 3.1*  CL 96*  --  100 102  CO2 25  --  21* 23  GLUCOSE 113*  --  173* 186*  BUN 28*  --  33* 43*  CREATININE 1.21*  --  1.35* 1.40*  CALCIUM 8.3*  --  7.9* 8.1*  MG  --  2.2  --   --    GFR: Estimated Creatinine Clearance: 34.7 mL/min (A) (by C-G formula based on SCr of 1.4 mg/dL (H)). Liver Function Tests: Recent Labs  Lab 11/22/2020 0936 11/08/20 0043 11/09/20 0253  AST 179* 111* 80*  ALT 109* 78* 68*  ALKPHOS 98 81 81  BILITOT 1.1 0.6 0.6  PROT 7.5 6.6 6.4*  ALBUMIN 3.1* 2.6* 2.6*    No results for input(s): LIPASE, AMYLASE in the last 168 hours. No results for input(s): AMMONIA in the last 168 hours. Coagulation Profile: Recent Labs  Lab 11/23/2020 1357  INR 1.2   Cardiac Enzymes: No results for input(s): CKTOTAL, CKMB, CKMBINDEX, TROPONINI in the last 168 hours. BNP (last 3 results) No results for input(s): PROBNP in the last 8760 hours. HbA1C: Recent Labs    11/08/20 0043  HGBA1C 6.3*   CBG: Recent Labs  Lab 11/08/20 1613 11/08/20 2135 11/09/20 0757  GLUCAP 181* 183* 183*   Lipid Profile: Recent Labs    11/28/2020 0936  TRIG 92   Thyroid Function Tests: No results for input(s): TSH, T4TOTAL, FREET4, T3FREE, THYROIDAB in the last 72 hours. Anemia Panel: Recent Labs    11/08/20 0043 11/09/20 0253  FERRITIN 3,170* 2,665*   Urine  analysis: No results found for: COLORURINE, APPEARANCEUR, LABSPEC, PHURINE, GLUCOSEU, HGBUR, BILIRUBINUR, KETONESUR, PROTEINUR, UROBILINOGEN, NITRITE, LEUKOCYTESUR Recent Results (from the past 240 hour(s))  Resp Panel by RT-PCR (Flu A&B, Covid) Nasopharyngeal Swab     Status: Abnormal   Collection Time:   9:10 AM   Specimen: Nasopharyngeal Swab; Nasopharyngeal(NP) swabs in vial transport medium  Result Value Ref Range Status   SARS Coronavirus 2 by RT PCR POSITIVE (A) NEGATIVE Final    Comment: RESULT CALLED TO, READ BACK BY AND VERIFIED WITH: DR Erskine Speed LOTT @1139  ON [id number] BY COHENK (NOTE) SARS-CoV-2 target nucleic acids are DETECTED.  The SARS-CoV-2 RNA is generally detectable in upper respiratory specimens during the acute phase of infection. Positive results are indicative of the presence of the identified virus, but do not rule out bacterial infection or co-infection with other pathogens not detected by the test. Clinical correlation with patient history and other diagnostic information is necessary to determine patient infection status. The expected result is Negative.  Fact Sheet for  Patients: EntrepreneurPulse.com.au  Fact Sheet for Healthcare Providers: IncredibleEmployment.be  This test is not yet approved or cleared by the [place] FDA and  has been authorized for detection and/or diagnosis of SARS-CoV-2 by FDA under an Emergency Use Authorization (EUA).  This EUA will remain in effect (meaning this tes t can be used) for the duration of  the COVID-19 declaration under Section 564(b)(1) of the Act, 21 U.S.C. section 360bbb-3(b)(1), unless the authorization is terminated or revoked sooner.     Influenza A by PCR NEGATIVE NEGATIVE Final   Influenza B by PCR NEGATIVE NEGATIVE Final    Comment: (NOTE) The Xpert Xpress SARS-CoV-2/FLU/RSV plus assay is intended as an aid in the diagnosis of influenza from Nasopharyngeal swab specimens and should not be used as a sole basis for treatment. Nasal washings and aspirates are unacceptable for Xpert Xpress SARS-CoV-2/FLU/RSV testing.  Fact Sheet for Patients: EntrepreneurPulse.com.au  Fact Sheet for Healthcare Providers: IncredibleEmployment.be  This test is not yet approved or cleared by the [place] FDA and has been authorized for detection and/or diagnosis of SARS-CoV-2 by FDA under an Emergency Use Authorization (EUA). This EUA will remain in effect (meaning this test can be used) for the duration of the COVID-19 declaration under Section 564(b)(1) of the Act, 21 U.S.C. section 360bbb-3(b)(1), unless the authorization is terminated or revoked.  Performed at [facility], [place] [address] [place], [place] [address]   Blood Culture (routine x 2)     Status: None (Preliminary result)   Collection Time: 11/24/2020  9:10 AM   Specimen: BLOOD  Result Value Ref Range Status   Specimen Description   Final    BLOOD RIGHT ANTECUBITAL Performed at [facility] [address] [place], [place]  [address]    Special Requests   Final    BOTTLES DRAWN AEROBIC AND ANAEROBIC Blood Culture results may not be optimal due to an inadequate volume of blood received in culture bottles Performed at [facility] [address], [place], [place] [address]    Culture   Final    NO GROWTH 2 DAYS Performed at [facility] [address], [place], [place] [address]    Report Status PENDING  Incomplete  Blood Culture (routine x 2)     Status: None (Preliminary result)   Collection Time: 11/16/2020  9:41 AM   Specimen: BLOOD  Result Value Ref Range Status   Specimen Description  Final    BLOOD BLOOD RIGHT FOREARM Performed at [facility] [address] [place], [place] [address]    Special Requests   Final    BOTTLES DRAWN AEROBIC AND ANAEROBIC Blood Culture results may not be optimal due to an inadequate volume of blood received in culture bottles Performed at [facility] [address] [place], [place] [address]    Culture   Final    NO GROWTH 2 DAYS Performed at [facility] [address] [place], [place] [address]    Report Status PENDING  Incomplete  MRSA PCR Screening     Status: None   Collection Time: 11/17/2020  5:42 PM   Specimen: Nasal Mucosa; Nasopharyngeal  Result Value Ref Range Status   MRSA by PCR NEGATIVE NEGATIVE Final    Comment:        The GeneXpert MRSA Assay (FDA approved for NASAL specimens only), is one component of a comprehensive MRSA colonization surveillance program. It is not intended to diagnose MRSA infection nor to guide or monitor treatment for MRSA infections. Performed at [facility], [place] [address], [place], [place] [address]       Radiology Studies: VAS [place] LOWER EXTREMITY VENOUS (DVT)  Result Date: 11/08/2020  Lower Venous DVT Study Indications: D-dimer, Covid-19.  Comparison Study: No prior studies. Performing Technologist: Darlin Coco RDMS   Examination Guidelines: A complete evaluation includes B-mode imaging, spectral Doppler, color Doppler, and power Doppler as needed of all accessible portions of each vessel. Bilateral testing is considered an integral part of a complete examination. Limited examinations for reoccurring indications may be performed as noted. The reflux portion of the exam is performed with the patient in reverse Trendelenburg.  +---------+---------------+---------+-----------+----------+--------------+  RIGHT     Compressibility Phasicity Spontaneity Properties Thrombus Aging  +---------+---------------+---------+-----------+----------+--------------+  CFV       Full            Yes       Yes                                    +---------+---------------+---------+-----------+----------+--------------+  SFJ       Full                                                             +---------+---------------+---------+-----------+----------+--------------+  FV Prox   Full                                                             +---------+---------------+---------+-----------+----------+--------------+  FV Mid    Full                                                             +---------+---------------+---------+-----------+----------+--------------+  FV Distal Full                                                             +---------+---------------+---------+-----------+----------+--------------+  PFV       Full                                                             +---------+---------------+---------+-----------+----------+--------------+  POP       Full            Yes       Yes                                    +---------+---------------+---------+-----------+----------+--------------+  PTV       Full                                                             +---------+---------------+---------+-----------+----------+--------------+  PERO      Full                                                              +---------+---------------+---------+-----------+----------+--------------+   +---------+---------------+---------+-----------+----------+--------------+  LEFT      Compressibility Phasicity Spontaneity Properties Thrombus Aging  +---------+---------------+---------+-----------+----------+--------------+  CFV       Full            Yes       Yes                                    +---------+---------------+---------+-----------+----------+--------------+  SFJ       Full                                                             +---------+---------------+---------+-----------+----------+--------------+  FV Prox   Full                                                             +---------+---------------+---------+-----------+----------+--------------+  FV Mid    Full                                                             +---------+---------------+---------+-----------+----------+--------------+  FV Distal Full                                                             +---------+---------------+---------+-----------+----------+--------------+  PFV       Full                                                             +---------+---------------+---------+-----------+----------+--------------+  POP       Full            Yes       Yes                                    +---------+---------------+---------+-----------+----------+--------------+  PTV       Full                                                             +---------+---------------+---------+-----------+----------+--------------+  PERO      Full                                                             +---------+---------------+---------+-----------+----------+--------------+     Summary: RIGHT: - There is no evidence of deep vein thrombosis in the lower extremity.  - No cystic structure found in the popliteal fossa.  LEFT: - There is no evidence of deep vein thrombosis in the lower extremity.  - No cystic structure found in the popliteal fossa.   *See table(s) above for measurements and observations.    Preliminary     Scheduled Meds:  baricitinib  2 mg Oral Daily   chlorhexidine  15 mL Mouth Rinse BID   Chlorhexidine Gluconate Cloth  6 each Topical Daily   diltiazem  240 mg Oral Daily   insulin aspart  0-15 Units Subcutaneous TID WC   insulin aspart  0-5 Units Subcutaneous QHS   Ipratropium-Albuterol  1 puff Inhalation Q6H   mouth rinse  15 mL Mouth Rinse q12n4p   methylPREDNISolone (SOLU-MEDROL) injection  100 mg Intravenous Q12H   Followed by   Derrill Memo ON 11/10/2020] predniSONE  50 mg Oral Daily   potassium chloride  40 mEq Oral BID   sodium chloride flush  3 mL Intravenous Q12H   Continuous Infusions:  azithromycin Stopped (11/08/20 1325)   cefTRIAXone (ROCEPHIN)  IV Stopped (11/08/20 1430)   heparin 900 Units/hr (11/08/20 0800)   remdesivir 100 mg in NS 100 mL 100 mg (11/09/20 0955)     LOS: 2 days   Time spent: 35 minutes.  Patrecia Pour, MD [facility] [email or web address] 11/09/2020, 10:46 AM

## 2020-11-09 NOTE — Progress Notes (Signed)
°  Echocardiogram 2D Echocardiogram has been performed.  Fidel Levy 11/09/2020, 3:25 PM

## 2020-11-10 LAB — COMPREHENSIVE METABOLIC PANEL
ALT: 63 U/L — ABNORMAL HIGH (ref 0–44)
AST: 66 U/L — ABNORMAL HIGH (ref 15–41)
Albumin: 2.5 g/dL — ABNORMAL LOW (ref 3.5–5.0)
Alkaline Phosphatase: 90 U/L (ref 38–126)
Anion gap: 13 (ref 5–15)
BUN: 48 mg/dL — ABNORMAL HIGH (ref 8–23)
CO2: 23 mmol/L (ref 22–32)
Calcium: 8.2 mg/dL — ABNORMAL LOW (ref 8.9–10.3)
Chloride: 107 mmol/L (ref 98–111)
Creatinine, Ser: 1.32 mg/dL — ABNORMAL HIGH (ref 0.44–1.00)
GFR, Estimated: 41 mL/min — ABNORMAL LOW (ref >60)
Glucose, Bld: 202 mg/dL — ABNORMAL HIGH (ref 70–99)
Potassium: 3.8 mmol/L (ref 3.5–5.1)
Sodium: 143 mmol/L (ref 135–145)
Total Bilirubin: 0.7 mg/dL (ref 0.3–1.2)
Total Protein: 6.2 g/dL — ABNORMAL LOW (ref 6.5–8.1)

## 2020-11-10 LAB — CBC WITH DIFFERENTIAL/PLATELET
Abs Immature Granulocytes: 0.14 10*3/uL — ABNORMAL HIGH (ref 0.00–0.07)
Basophils Absolute: 0 10*3/uL (ref 0.0–0.1)
Basophils Relative: 0 %
Eosinophils Absolute: 0 10*3/uL (ref 0.0–0.5)
Eosinophils Relative: 0 %
HCT: 42 % (ref 36.0–46.0)
Hemoglobin: 13.8 g/dL (ref 12.0–15.0)
Immature Granulocytes: 1 %
Lymphocytes Relative: 6 %
Lymphs Abs: 0.9 10*3/uL (ref 0.7–4.0)
MCH: 30.2 pg (ref 26.0–34.0)
MCHC: 32.9 g/dL (ref 30.0–36.0)
MCV: 91.9 fL (ref 80.0–100.0)
Monocytes Absolute: 0.6 10*3/uL (ref 0.1–1.0)
Monocytes Relative: 4 %
Neutro Abs: 13.5 10*3/uL — ABNORMAL HIGH (ref 1.7–7.7)
Neutrophils Relative %: 89 %
Platelets: 376 10*3/uL (ref 150–400)
RBC: 4.57 MIL/uL (ref 3.87–5.11)
RDW: 14.2 % (ref 11.5–15.5)
WBC: 15.1 10*3/uL — ABNORMAL HIGH (ref 4.0–10.5)
nRBC: 0 % (ref 0.0–0.2)

## 2020-11-10 LAB — GLUCOSE, CAPILLARY
Glucose-Capillary: 198 mg/dL — ABNORMAL HIGH (ref 70–99)
Glucose-Capillary: 170 mg/dL — ABNORMAL HIGH (ref 70–99)
Glucose-Capillary: 202 mg/dL — ABNORMAL HIGH (ref 70–99)
Glucose-Capillary: 184 mg/dL — ABNORMAL HIGH (ref 70–99)

## 2020-11-10 LAB — FERRITIN: Ferritin: 1591 ng/mL — ABNORMAL HIGH (ref 11–307)

## 2020-11-10 LAB — C-REACTIVE PROTEIN: CRP: 6.2 mg/dL — ABNORMAL HIGH (ref <1.0)

## 2020-11-10 LAB — D-DIMER, QUANTITATIVE: D-Dimer, Quant: 1.47 ug/mL-FEU — ABNORMAL HIGH (ref 0.00–0.50)

## 2020-11-10 LAB — HEPARIN LEVEL (UNFRACTIONATED): Heparin Unfractionated: 0.41 IU/mL (ref 0.30–0.70)

## 2020-11-10 MED ORDER — APIXABAN 5 MG PO TABS
5.0000 mg | ORAL_TABLET | Freq: Two times a day (BID) | ORAL | Status: DC
  Administered 2020-11-10 – 2020-11-12 (×5): 5 mg via ORAL
  Filled 2020-11-10 (×3): qty 1
  Filled 2020-11-10: qty 2
  Filled 2020-11-10: qty 1

## 2020-11-10 NOTE — Progress Notes (Signed)
PROGRESS NOTE  Jill Cooke  KYH:[id number] DOB: 1941/09/30 DOA: 11/19/2020 PCP: Katherina Mires, MD   Brief Narrative: Jill Cooke is an 79 y.o. female with a history of atrial fibrillation on eliquis, and HTN who presented to the ED 12/9 with dyspnea and cough found to be severely hypoxic, tachypneic with multifocal infiltrates on CXR and positive SARS-CoV-2 PCR. CRP 19.3, d-dimer 4.7, PCT 0.54. Full code was confirmed, PCCM notified, though patient ultimately admitted to hospitalist service on 40L HHFNC O2, started on remdesivir, solumedrol, baricitinib, empiric antibiotics, and therapeutic heparin. Remains at high risk of intubation.  Assessment & Plan: Principal Problem:   Acute hypoxemic respiratory failure due to COVID-19 [facility]) Active Problems:   Atrial fibrillation with RVR (HCC)   Essential (primary) hypertension   Encephalopathy due to COVID-19 virus   AKI (acute kidney injury) ([place])   CKD (chronic kidney disease), stage III (HCC)   Hypokalemia  Acute hypoxemic respiratory failure due to covid-19 pneumonia and suspected superimposed bacterial CAP: SARS-CoV-2 PCR positive on 12/9. PCT elevated at admission.  - Continue remdesivir (12/9 - 12/13) - Continue steroids at ~1mg /kg q12h. CRP improving - Continue baricitinib, started 12/9 due to patient's severity of illness.  - Plan to continue abx empirically x5 days. PCT responded - Encourage OOB, IS, FV, and awake proning if able - Continue airborne, contact precautions for 21 days from positive testing.  Elevated d-dimer: Remains too unstable for CTA and renal function worsened.  - Negative LE venous U/S, echocardiogram without any RV strain, with normal PASP. We will deescalate IV heparin to home eliquis now that renal function has stabilized.  Atrial fibrillation with RVR:  - Restarted home diltiazem. Rate control improved. - Restart home eliquis. - prn labetalol  Prediabetes with steroid-induced hyperglycemia: HbA1c 6.3%. -  Continue SSI with CBGs at inpatient goal.  - Added linagliptin  AKI on stage IIIa CKD:  - Renal function stabilized as of 12/12. Continuing restrictive fluid strategy in light of covid infection.  Acute hypoxic encephalopathy: Mentation normalized with oxygen supplementation.  - Delirium precautions.  - Pt currently maintains capacity to make her own medical decisions. Confirms desire for full code and trial of intubation.  LFT elevation: Likely due to covid-19, improving.  - Monitor while on therapies as above. Stable-to-improving  HTN:  - Continue medications as above  Hypokalemia: Supplemented.  - Resolved, continue monitoring. Mg 2.2.   Morbid obesity: Estimated body mass index is 38.79 kg/m as calculated from the following:   Height as of this encounter: 5\' 2"  (1.575 m).   Weight as of this encounter: 96.2 kg.  DVT prophylaxis: IV heparin > eliquis Code Status: Full Family Communication: None. Has declined offers to call family. Disposition Plan: Remain in ICU Status is: Inpatient  Remains inpatient appropriate because:Hemodynamically unstable and Inpatient level of care appropriate due to severity of illness  Dispo: The patient is from: Home              Anticipated d/c is to: TBD              Anticipated d/c date is: > 3 days              Patient currently is not medically stable to d/c.  Consultants:   None, PCCM aware of admission.  Procedures:   None  Antimicrobials:  Remdesivir  Ceftriaxone  Azithromycin   Subjective: Shortness of breath at rest feels constant, moderate, improved overall, worse with exertion in bed. No chest  pain or bleeding.   Objective: Vitals:   11/10/20 0456 11/10/20 0841 11/10/20 0901 11/10/20 0906  BP:   (!) 125/41   Pulse: 98   100  Resp: 20     Temp:  (!) 97.4 F (36.3 C)    TempSrc:  Axillary    SpO2: (!) 89%   92%  Weight:      Height:        Intake/Output Summary (Last 24 hours) at 11/10/2020 1054 Last data  filed at 11/09/2020 1800 Gross per 24 hour  Intake --  Output 200 ml  Net -200 ml   Filed Weights   11/19/2020 0908 11/16/2020 1706  Weight: 102.1 kg 96.2 kg   Gen: Elderly, frail female in no distress Pulm: Nonlabored but tachypneic with HHF + NRB, crackles stable. CV: Regular borderline tachycardia without murmur, rub, or gallop. No JVD, no pitting dependent edema. GI: Abdomen soft, non-tender, non-distended, with normoactive bowel sounds.  Ext: Warm, no deformities.  Skin: No new rashes, lesions or ulcers on visualized skin. Neuro: Alert and oriented. Diffusely weak without focal neurological deficits. Psych: Judgement and insight appear fair. Mood euthymic & affect congruent. Behavior is appropriate.    Data Reviewed: I have personally reviewed following labs and imaging studies  CBC: Recent Labs  Lab 10/30/2020 0936 11/08/20 0043 11/09/20 0253 11/10/20 0307  WBC 9.1 6.3 9.2 15.1*  NEUTROABS 8.2* 5.5 8.0* 13.5*  HGB 16.5* 15.4* 13.9 13.8  HCT 48.9* 47.0* 42.1 42.0  MCV 90.7 92.2 91.3 91.9  PLT 264 251 329 671   Basic Metabolic Panel: Recent Labs  Lab 11/02/2020 0936 11/05/2020 1357 11/08/20 0043 11/09/20 0253 11/10/20 0307  NA 135  --  136 138 143  K 3.4*  --  3.9 3.1* 3.8  CL 96*  --  100 102 107  CO2 25  --  21* 23 23  GLUCOSE 113*  --  173* 186* 202*  BUN 28*  --  33* 43* 48*  CREATININE 1.21*  --  1.35* 1.40* 1.32*  CALCIUM 8.3*  --  7.9* 8.1* 8.2*  MG  --  2.2  --   --   --    GFR: Estimated Creatinine Clearance: 36.8 mL/min (A) (by C-G formula based on SCr of 1.32 mg/dL (H)). Liver Function Tests: Recent Labs  Lab 11/02/2020 0936 11/08/20 0043 11/09/20 0253 11/10/20 0307  AST 179* 111* 80* 66*  ALT 109* 78* 68* 63*  ALKPHOS 98 81 81 90  BILITOT 1.1 0.6 0.6 0.7  PROT 7.5 6.6 6.4* 6.2*  ALBUMIN 3.1* 2.6* 2.6* 2.5*   Coagulation Profile: Recent Labs  Lab 11/28/2020 1357  INR 1.2   HbA1C: Recent Labs    11/08/20 0043  HGBA1C 6.3*   CBG: Recent  Labs  Lab 11/09/20 0757 11/09/20 1157 11/09/20 1559 11/09/20 2239 11/10/20 0758  GLUCAP 183* 198* 167* 198* 170*   Anemia Panel: Recent Labs    11/09/20 0253 11/10/20 0307  FERRITIN 2,665* 1,591*    Recent Results (from the past 240 hour(s))  Resp Panel by RT-PCR (Flu A&B, Covid) Nasopharyngeal Swab     Status: Abnormal   Collection Time: 11/26/2020  9:10 AM   Specimen: Nasopharyngeal Swab; Nasopharyngeal(NP) swabs in vial transport medium  Result Value Ref Range Status   SARS Coronavirus 2 by RT PCR POSITIVE (A) NEGATIVE Final    Comment: RESULT CALLED TO, READ BACK BY AND VERIFIED WITH: DR Erskine Speed LOTT @1139  ON [id number] BY COHENK (NOTE) SARS-CoV-2 target nucleic acids  are DETECTED.  The SARS-CoV-2 RNA is generally detectable in upper respiratory specimens during the acute phase of infection. Positive results are indicative of the presence of the identified virus, but do not rule out bacterial infection or co-infection with other pathogens not detected by the test. Clinical correlation with patient history and other diagnostic information is necessary to determine patient infection status. The expected result is Negative.  Fact Sheet for Patients: EntrepreneurPulse.com.au  Fact Sheet for Healthcare Providers: IncredibleEmployment.be  This test is not yet approved or cleared by the [place] FDA and  has been authorized for detection and/or diagnosis of SARS-CoV-2 by FDA under an Emergency Use Authorization (EUA).  This EUA will remain in effect (meaning this tes t can be used) for the duration of  the COVID-19 declaration under Section 564(b)(1) of the Act, 21 U.S.C. section 360bbb-3(b)(1), unless the authorization is terminated or revoked sooner.     Influenza A by PCR NEGATIVE NEGATIVE Final   Influenza B by PCR NEGATIVE NEGATIVE Final    Comment: (NOTE) The Xpert Xpress SARS-CoV-2/FLU/RSV plus assay is intended as an aid in  the diagnosis of influenza from Nasopharyngeal swab specimens and should not be used as a sole basis for treatment. Nasal washings and aspirates are unacceptable for Xpert Xpress SARS-CoV-2/FLU/RSV testing.  Fact Sheet for Patients: EntrepreneurPulse.com.au  Fact Sheet for Healthcare Providers: IncredibleEmployment.be  This test is not yet approved or cleared by the [place] FDA and has been authorized for detection and/or diagnosis of SARS-CoV-2 by FDA under an Emergency Use Authorization (EUA). This EUA will remain in effect (meaning this test can be used) for the duration of the COVID-19 declaration under Section 564(b)(1) of the Act, 21 U.S.C. section 360bbb-3(b)(1), unless the authorization is terminated or revoked.  Performed at [facility], [place] [address], [place], [place] [address]   Blood Culture (routine x 2)     Status: None (Preliminary result)   Collection Time: 11/05/2020  9:10 AM   Specimen: BLOOD  Result Value Ref Range Status   Specimen Description   Final    BLOOD RIGHT ANTECUBITAL Performed at [facility] [address], [place], [place] [address]    Special Requests   Final    BOTTLES DRAWN AEROBIC AND ANAEROBIC Blood Culture results may not be optimal due to an inadequate volume of blood received in culture bottles Performed at [facility] [address] [place], [place] [address]    Culture   Final    NO GROWTH 2 DAYS Performed at [facility] [address] [place], [place] [address]    Report Status PENDING  Incomplete  Blood Culture (routine x 2)     Status: None (Preliminary result)   Collection Time: 11/28/2020  9:41 AM   Specimen: BLOOD  Result Value Ref Range Status   Specimen Description   Final    BLOOD BLOOD RIGHT FOREARM Performed at [facility] [address] [place], [place] [address]    Special Requests    Final    BOTTLES DRAWN AEROBIC AND ANAEROBIC Blood Culture results may not be optimal due to an inadequate volume of blood received in culture bottles Performed at [facility] [address] [place], [place] [address]    Culture   Final    NO GROWTH 2 DAYS Performed at [facility] [address], [place], [place] [address]    Report Status PENDING  Incomplete  MRSA PCR Screening     Status: None   Collection Time: 11/06/2020  5:42 PM   Specimen: Nasal Mucosa; Nasopharyngeal  Result Value Ref Range Status   MRSA by PCR NEGATIVE NEGATIVE Final    Comment:        The GeneXpert MRSA Assay (FDA approved for NASAL specimens only), is one component of a comprehensive MRSA colonization surveillance program. It is not intended to diagnose MRSA infection nor to guide or monitor treatment for MRSA infections. Performed at [facility], [place] [address], [place], [place] [address]       Radiology Studies: ECHOCARDIOGRAM COMPLETE  Result Date: 11/09/2020    ECHOCARDIOGRAM REPORT   Patient Name:   Jill Cooke Date of Exam: 11/09/2020 Medical Rec #:  [id number]   Height:       62.0 in Accession #:    [id number]  Weight:       212.1 lb Date of Birth:  Dec 10, 1940   BSA:          1.960 m Patient Age:    24 years    BP:           109/67 mmHg Patient Gender: F           HR:           95 bpm. Exam Location:  Inpatient Procedure: 2D Echo, Color Doppler and Cardiac Doppler Indications:    Acute Respiratory Insufficiency 518.82 / R06.89                 Dyspnea 786.09 / R06.00  History:        Patient has prior history of Echocardiogram examinations, most                 recent 10/20/2017. Arrythmias:Atrial Fibrillation.  Sonographer:    Bernadene Person RDCS Referring Phys: [place]  1. Left ventricular ejection fraction, by estimation, is 60 to 65%. The left ventricle has normal function. The left ventricle has no regional wall motion  abnormalities. There is mild left ventricular hypertrophy. Left ventricular diastolic parameters are indeterminate.  2. Right ventricular systolic function is normal. The right ventricular size is normal. There is normal pulmonary artery systolic pressure.  3. Left atrial size was mildly dilated.  4. Right atrial size was mildly dilated.  5. The mitral valve is normal in structure. Trivial mitral valve regurgitation. No evidence of mitral stenosis.  6. The aortic valve is tricuspid. Aortic valve regurgitation is not visualized. Mild aortic valve sclerosis is present, with no evidence of aortic valve stenosis.  7. The inferior vena cava is normal in size with greater than 50% respiratory variability, suggesting right atrial pressure of 3 mmHg. FINDINGS  Left Ventricle: Left ventricular ejection fraction, by estimation, is 60 to 65%. The left ventricle has normal function. The left ventricle has no regional wall motion abnormalities. The left ventricular internal cavity size was normal in size. There is  mild left ventricular hypertrophy. Left ventricular diastolic parameters are indeterminate. Right Ventricle: The right ventricular size is normal. No increase in right ventricular wall thickness. Right ventricular systolic function is normal. There is normal pulmonary artery systolic pressure. The tricuspid regurgitant velocity is 2.41 m/s, and  with an assumed right atrial pressure of 3 mmHg, the estimated right ventricular systolic pressure is 16.6 mmHg. Left Atrium: Left atrial size was mildly dilated. Right Atrium: Right atrial size was mildly dilated. Pericardium: There is no evidence of pericardial effusion. Mitral  Valve: The mitral valve is normal in structure. Trivial mitral valve regurgitation. No evidence of mitral valve stenosis. Tricuspid Valve: The tricuspid valve is normal in structure. Tricuspid valve regurgitation is not demonstrated. No evidence of tricuspid stenosis. Aortic Valve: The aortic valve is  tricuspid. Aortic valve regurgitation is not visualized. Mild aortic valve sclerosis is present, with no evidence of aortic valve stenosis. Pulmonic Valve: The pulmonic valve was normal in structure. Pulmonic valve regurgitation is not visualized. No evidence of pulmonic stenosis. Aorta: The aortic root is normal in size and structure. Venous: The inferior vena cava is normal in size with greater than 50% respiratory variability, suggesting right atrial pressure of 3 mmHg. IAS/Shunts: No atrial level shunt detected by color flow Doppler.  LEFT VENTRICLE PLAX 2D LVIDd:         3.40 cm LVIDs:         2.30 cm LV PW:         1.10 cm LV IVS:        1.20 cm LVOT diam:     1.70 cm LV SV:         48 LV SV Index:   25 LVOT Area:     2.27 cm  RIGHT VENTRICLE TAPSE (M-mode): 1.5 cm LEFT ATRIUM             Index       RIGHT ATRIUM           Index LA diam:        4.20 cm 2.14 cm/m  RA Area:     21.80 cm LA Vol (A2C):   65.5 ml 33.42 ml/m RA Volume:   60.20 ml  30.72 ml/m LA Vol (A4C):   81.9 ml 41.79 ml/m LA Biplane Vol: 80.6 ml 41.12 ml/m  AORTIC VALVE LVOT Vmax:   109.33 cm/s LVOT Vmean:  80.700 cm/s LVOT VTI:    0.213 m  AORTA Ao Root diam: 2.60 cm Ao Asc diam:  2.60 cm TRICUSPID VALVE TR Peak grad:   23.2 mmHg TR Vmax:        241.00 cm/s  SHUNTS Systemic VTI:  0.21 m Systemic Diam: 1.70 cm Jenkins Rouge MD Electronically signed by Jenkins Rouge MD Signature Date/Time: 11/09/2020/3:30:28 PM    Final    VAS [place] LOWER EXTREMITY VENOUS (DVT)  Result Date: 11/08/2020  Lower Venous DVT Study Indications: D-dimer, Covid-19.  Comparison Study: No prior studies. Performing Technologist: Darlin Coco RDMS  Examination Guidelines: A complete evaluation includes B-mode imaging, spectral Doppler, color Doppler, and power Doppler as needed of all accessible portions of each vessel. Bilateral testing is considered an integral part of a complete examination. Limited examinations for reoccurring indications may be performed as  noted. The reflux portion of the exam is performed with the patient in reverse Trendelenburg.  +---------+---------------+---------+-----------+----------+--------------+ RIGHT    CompressibilityPhasicitySpontaneityPropertiesThrombus Aging +---------+---------------+---------+-----------+----------+--------------+ CFV      Full           Yes      Yes                                 +---------+---------------+---------+-----------+----------+--------------+ SFJ      Full                                                        +---------+---------------+---------+-----------+----------+--------------+  FV Prox  Full                                                        +---------+---------------+---------+-----------+----------+--------------+ FV Mid   Full                                                        +---------+---------------+---------+-----------+----------+--------------+ FV DistalFull                                                        +---------+---------------+---------+-----------+----------+--------------+ PFV      Full                                                        +---------+---------------+---------+-----------+----------+--------------+ POP      Full           Yes      Yes                                 +---------+---------------+---------+-----------+----------+--------------+ PTV      Full                                                        +---------+---------------+---------+-----------+----------+--------------+ PERO     Full                                                        +---------+---------------+---------+-----------+----------+--------------+   +---------+---------------+---------+-----------+----------+--------------+ LEFT     CompressibilityPhasicitySpontaneityPropertiesThrombus Aging +---------+---------------+---------+-----------+----------+--------------+ CFV      Full            Yes      Yes                                 +---------+---------------+---------+-----------+----------+--------------+ SFJ      Full                                                        +---------+---------------+---------+-----------+----------+--------------+ FV Prox  Full                                                        +---------+---------------+---------+-----------+----------+--------------+  FV Mid   Full                                                        +---------+---------------+---------+-----------+----------+--------------+ FV DistalFull                                                        +---------+---------------+---------+-----------+----------+--------------+ PFV      Full                                                        +---------+---------------+---------+-----------+----------+--------------+ POP      Full           Yes      Yes                                 +---------+---------------+---------+-----------+----------+--------------+ PTV      Full                                                        +---------+---------------+---------+-----------+----------+--------------+ PERO     Full                                                        +---------+---------------+---------+-----------+----------+--------------+     Summary: RIGHT: - There is no evidence of deep vein thrombosis in the lower extremity.  - No cystic structure found in the popliteal fossa.  LEFT: - There is no evidence of deep vein thrombosis in the lower extremity.  - No cystic structure found in the popliteal fossa.  *See table(s) above for measurements and observations.    Preliminary     Scheduled Meds: . baricitinib  2 mg Oral Daily  . chlorhexidine  15 mL Mouth Rinse BID  . Chlorhexidine Gluconate Cloth  6 each Topical Daily  . diltiazem  240 mg Oral Daily  . insulin aspart  0-15 Units Subcutaneous TID WC  . insulin aspart  0-5  Units Subcutaneous QHS  . Ipratropium-Albuterol  1 puff Inhalation Q6H  . linagliptin  5 mg Oral Daily  . mouth rinse  15 mL Mouth Rinse q12n4p  . predniSONE  50 mg Oral Daily  . sodium chloride flush  3 mL Intravenous Q12H   Continuous Infusions: . azithromycin Stopped (11/09/20 1409)  . cefTRIAXone (ROCEPHIN)  IV Stopped (11/09/20 1255)  . heparin 900 Units/hr (11/10/20 0416)  . remdesivir 100 mg in NS 100 mL 100 mg (11/10/20 0906)     LOS: 3 days   Time spent: 35 minutes.  Patrecia Pour, MD [facility] [email or web address] 11/10/2020, 10:54 AM

## 2020-11-10 NOTE — Progress Notes (Signed)
ANTICOAGULATION CONSULT NOTE - Follow Up Consult  Pharmacy Consult for Heparin IV Indication: suspected PE, empiric treatment  No Known Allergies  Patient Measurements: Height: 5\' 2"  (157.5 cm) Weight: 96.2 kg (212 lb 1.3 oz) IBW/kg (Calculated) : 50.1 Heparin Dosing Weight: 73 kg  Vital Signs: Temp: 97.5 F (36.4 C) (12/12 0400) Temp Source: Oral (12/12 0400) BP: 127/67 (12/12 0417) Pulse Rate: 98 (12/12 0456)  Labs: Recent Labs    11/18/2020 1357 11/08/20 0043 11/08/20 1144 11/08/20 2109 11/09/20 0253 11/10/20 0307  HGB  --  15.4*  --   --  13.9 13.8  HCT  --  47.0*  --   --  42.1 42.0  PLT  --  251  --   --  329 376  APTT 31  --   --   --   --   --   LABPROT 14.8  --   --   --   --   --   INR 1.2  --   --   --   --   --   HEPARINUNFRC  --  1.08*   < > 0.46 0.36 0.41  CREATININE  --  1.35*  --   --  1.40* 1.32*   < > = values in this interval not displayed.    Estimated Creatinine Clearance: 36.8 mL/min (A) (by C-G formula based on SCr of 1.32 mg/dL (H)).   Medications:  Infusions:  . azithromycin Stopped (11/09/20 1409)  . cefTRIAXone (ROCEPHIN)  IV Stopped (11/09/20 1255)  . heparin 900 Units/hr (11/10/20 0416)  . remdesivir 100 mg in NS 100 mL Stopped (11/09/20 1225)    Assessment: 22 yoF admitted on 12/9 with COVID pneumonia.  PMH includes AFib on chronic apixaban anticoagulation.  Med rec is in process of completion but pt's daughter states that pt has not had  Eliquis in > 1 week as pt has not been feeling well. Pharmacy is consulted to dose heparin for empiric treatment of suspected PE.  CT unable to be obtained.   Baseline labs : INR 1.2, PT 14.8, aPTT 31, HL is < 0.10, confirming  no recent apixaban intake    12/10 Vascular US: negative for DVT  Today, 11/10/2020: Heparin level 0.41 on heparin 900 units/hr CBC: Hgb stable at 13.8.  Admission Hgb elevated but suspect baseline is closer to ~14 (02/2020, 08/2020).  Plt WNL No bleeding or complications  reported. SCr 1.3 D-dimer 4.7 > 2.8 > 1.3 > 1.4  Goal of Therapy:  Heparin level 0.3-0.7 units/ml Monitor platelets by anticoagulation protocol: Yes   Plan:  Continue heparin IV infusion at 900 units/hr Daily heparin level and CBC Follow up imaging if/when available, long-term anticoagulation plan   Gretta Arab PharmD, BCPS Clinical Pharmacist [facility] main pharmacy [phone] 11/10/2020 8:40 AM

## 2020-11-10 NOTE — Progress Notes (Signed)
ANTICOAGULATION CONSULT NOTE - Follow Up Consult  Pharmacy Consult for apixaban Indication: Afib  No Known Allergies  Patient Measurements: Height: 5\' 2"  (157.5 cm) Weight: 96.2 kg (212 lb 1.3 oz) IBW/kg (Calculated) : 50.1 Heparin Dosing Weight: 73 kg  Vital Signs: Temp: 97.4 F (36.3 C) (12/12 0841) Temp Source: Axillary (12/12 0841) BP: 125/41 (12/12 0901) Pulse Rate: 100 (12/12 0906)  Labs: Recent Labs    11/06/2020 1307 11/21/2020 1357 11/08/20 0043 11/08/20 1144 11/08/20 2109 11/09/20 0253 11/10/20 0307  HGB   < >  --  15.4*  --   --  13.9 13.8  HCT  --   --  47.0*  --   --  42.1 42.0  PLT  --   --  251  --   --  329 376  APTT  --  31  --   --   --   --   --   LABPROT  --  14.8  --   --   --   --   --   INR  --  1.2  --   --   --   --   --   HEPARINUNFRC  --   --  1.08*   < > 0.46 0.36 0.41  CREATININE  --   --  1.35*  --   --  1.40* 1.32*   < > = values in this interval not displayed.    Estimated Creatinine Clearance: 36.8 mL/min (A) (by C-G formula based on SCr of 1.32 mg/dL (H)).   Medications:  Infusions:  . azithromycin Stopped (11/09/20 1409)  . cefTRIAXone (ROCEPHIN)  IV Stopped (11/09/20 1255)  . heparin 900 Units/hr (11/10/20 0416)  . remdesivir 100 mg in NS 100 mL 100 mg (11/10/20 0906)    Assessment: 61 yoF admitted on 12/9 with COVID pneumonia.  PMH includes AFib on chronic apixaban anticoagulation.  Med rec is in process of completion but pt's daughter states that pt has not had  Eliquis in > 1 week as pt has not been feeling well. Pharmacy is consulted to dose heparin for empiric treatment of suspected PE.  CT unable to be obtained.   Baseline labs : INR 1.2, PT 14.8, aPTT 31, HL is < 0.10, confirming  no recent apixaban intake    12/10 Vascular US: negative for DVT  Today, 11/10/2020: Heparin level 0.41 on heparin 900 units/hr CBC: Hgb stable at 13.8.  Admission Hgb elevated but suspect baseline is closer to ~14 (02/2020, 08/2020).  Plt  WNL No bleeding or complications reported. SCr 1.3 D-dimer 4.7 > 2.8 > 1.3 > 1.4  Goal of Therapy:  Heparin level 0.3-0.7 units/ml Monitor platelets by anticoagulation protocol: Yes   Plan:  D/C heparin drip Resume PTA apixaban 5 mg PO BID   Gretta Arab PharmD, BCPS Clinical Pharmacist [facility] main pharmacy [phone] 11/10/2020 11:12 AM

## 2020-11-11 LAB — COMPREHENSIVE METABOLIC PANEL
ALT: 64 U/L — ABNORMAL HIGH (ref 0–44)
AST: 69 U/L — ABNORMAL HIGH (ref 15–41)
Albumin: 2.6 g/dL — ABNORMAL LOW (ref 3.5–5.0)
Alkaline Phosphatase: 123 U/L (ref 38–126)
Anion gap: 12 (ref 5–15)
BUN: 40 mg/dL — ABNORMAL HIGH (ref 8–23)
CO2: 22 mmol/L (ref 22–32)
Calcium: 8.1 mg/dL — ABNORMAL LOW (ref 8.9–10.3)
Chloride: 110 mmol/L (ref 98–111)
Creatinine, Ser: 1.18 mg/dL — ABNORMAL HIGH (ref 0.44–1.00)
GFR, Estimated: 47 mL/min — ABNORMAL LOW (ref >60)
Glucose, Bld: 172 mg/dL — ABNORMAL HIGH (ref 70–99)
Potassium: 3.6 mmol/L (ref 3.5–5.1)
Sodium: 144 mmol/L (ref 135–145)
Total Bilirubin: 0.6 mg/dL (ref 0.3–1.2)
Total Protein: 6.4 g/dL — ABNORMAL LOW (ref 6.5–8.1)

## 2020-11-11 LAB — GLUCOSE, CAPILLARY
Glucose-Capillary: 250 mg/dL — ABNORMAL HIGH (ref 70–99)
Glucose-Capillary: 176 mg/dL — ABNORMAL HIGH (ref 70–99)
Glucose-Capillary: 196 mg/dL — ABNORMAL HIGH (ref 70–99)
Glucose-Capillary: 164 mg/dL — ABNORMAL HIGH (ref 70–99)
Glucose-Capillary: 158 mg/dL — ABNORMAL HIGH (ref 70–99)

## 2020-11-11 LAB — BLOOD GAS, ARTERIAL
Acid-Base Excess: 0.6 mmol/L (ref 0.0–2.0)
Bicarbonate: 23.5 mmol/L (ref 20.0–28.0)
FIO2: 100
O2 Saturation: 93.7 %
Patient temperature: 97.5
pCO2 arterial: 33.1 mmHg (ref 32.0–48.0)
pH, Arterial: 7.462 — ABNORMAL HIGH (ref 7.350–7.450)
pO2, Arterial: 66.6 mmHg — ABNORMAL LOW (ref 83.0–108.0)

## 2020-11-11 LAB — C-REACTIVE PROTEIN: CRP: 4.6 mg/dL — ABNORMAL HIGH (ref <1.0)

## 2020-11-11 NOTE — Progress Notes (Signed)
PROGRESS NOTE  Jill Cooke  URK:[id number] DOB: 13-Jan-1941 DOA: 11/10/2020 PCP: Katherina Mires, MD   Brief Narrative: Jill Cooke is an 79 y.o. female with a history of atrial fibrillation on eliquis, and HTN who presented to the ED 12/9 with dyspnea and cough found to be severely hypoxic, tachypneic with multifocal infiltrates on CXR and positive SARS-CoV-2 PCR. CRP 19.3, d-dimer 4.7, PCT 0.54. Full code was confirmed, PCCM notified, though patient ultimately admitted to hospitalist service on 40L HHFNC O2, started on remdesivir, solumedrol, baricitinib, empiric antibiotics, and therapeutic heparin. Remains at high risk of intubation.  Assessment & Plan: Principal Problem:   Acute hypoxemic respiratory failure due to COVID-19 [facility]) Active Problems:   Atrial fibrillation with RVR (HCC)   Essential (primary) hypertension   Encephalopathy due to COVID-19 virus   AKI (acute kidney injury) ([place])   CKD (chronic kidney disease), stage III (HCC)   Hypokalemia  Acute hypoxemic respiratory failure due to covid-19 pneumonia and suspected superimposed bacterial CAP: SARS-CoV-2 PCR positive on 12/9. PCT elevated at admission.  - Continue remdesivir (12/9 - 12/13) - Continue steroids at ~1mg /kg q12h. CRP improving, hypoxia not yet responding. - Continue baricitinib, started 12/9 due to patient's severity of illness.  - Complete 5 days abx today.  - Encourage OOB, IS, FV, and awake proning if able - Continue airborne, contact precautions for 21 days from positive testing.  Elevated d-dimer: Remains too unstable for CTA and renal function worsened.  - Negative LE venous U/S, echocardiogram without any RV strain, with normal PASP. Continue eliquis   Atrial fibrillation with RVR:  - Restarted home diltiazem. Rate control improved. - Restart home eliquis. - prn labetalol  Prediabetes with steroid-induced hyperglycemia: HbA1c 6.3%. - Continue SSI with CBGs at inpatient goal.  - Added  linagliptin  AKI on stage IIIa CKD:  - Renal function stabilized as of 12/12. Continuing restrictive fluid strategy in light of covid infection.  Acute hypoxic encephalopathy: Mentation normalized with oxygen supplementation.  - Delirium precautions.  - Pt currently maintains capacity to make her own medical decisions. Confirms desire for full code and trial of intubation.  LFT elevation: Likely due to covid-19, improving.  - Monitor while on therapies as above. Stable-to-improving  HTN:  - Continue medications as above  Hypokalemia: Supplemented.  - Resolved, continue monitoring. Mg 2.2.   Morbid obesity: Estimated body mass index is 38.79 kg/m as calculated from the following:   Height as of this encounter: 5\' 2"  (1.575 m).   Weight as of this encounter: 96.2 kg.  DVT prophylaxis: Eliquis Code Status: Full Family Communication: None. Has declined offers to call family, again today. Disposition Plan: Remain in ICU Status is: Inpatient  Remains inpatient appropriate because:Hemodynamically unstable and Inpatient level of care appropriate due to severity of illness  Dispo: The patient is from: Home              Anticipated d/c is to: TBD              Anticipated d/c date is: > 3 days              Patient currently is not medically stable to d/c.  Consultants:   None, PCCM aware of admission.  Procedures:   None  Antimicrobials:  Remdesivir  Ceftriaxone  Azithromycin   Subjective: Remains severely dyspneic with any exertion, specifically with just bed level mobility during encounter, this remained constant and very gradually improved with application of NRB on top of  HHF at [place], associated with SpO2 into low 70%'s.  Objective: Vitals:   11/11/20 0830 11/11/20 0855 11/11/20 0900 11/11/20 1000  BP:  (!) 169/92 (!) 152/78 (!) 158/80  Pulse: 96 (!) 109 69 91  Resp: (!) 28 (!) 27 17 (!) 27  Temp:      TempSrc:      SpO2: (!) 89% 93% 91% 94%  Weight:       Height:        Intake/Output Summary (Last 24 hours) at 11/11/2020 1232 Last data filed at 11/11/2020 1056 Gross per 24 hour  Intake 930.52 ml  Output 100 ml  Net 830.52 ml   Filed Weights   11/18/2020 0908 11/09/2020 1706  Weight: 102.1 kg 96.2 kg   Gen: Elderly female in no distress Pulm: Labored with exertion, tachypneic with diminished breath sounds throughout.  CV: Regular tachycardia without murmur, rub, or gallop. No JVD, no dependent edema. GI: Abdomen soft, non-tender, non-distended, with normoactive bowel sounds.  Ext: Warm, no deformities Skin: No rashes, lesions or ulcers on visualized skin. Neuro: Alert and oriented. No focal neurological deficits. Psych: Judgement and insight appear fair. Mood euthymic & affect congruent. Behavior is appropriate.    Data Reviewed: I have personally reviewed following labs and imaging studies  CBC: Recent Labs  Lab 11/16/2020 0936 11/08/20 0043 11/09/20 0253 11/10/20 0307  WBC 9.1 6.3 9.2 15.1*  NEUTROABS 8.2* 5.5 8.0* 13.5*  HGB 16.5* 15.4* 13.9 13.8  HCT 48.9* 47.0* 42.1 42.0  MCV 90.7 92.2 91.3 91.9  PLT 264 251 329 387   Basic Metabolic Panel: Recent Labs  Lab 11/02/2020 0936 11/11/2020 1357 11/08/20 0043 11/09/20 0253 11/10/20 0307 11/11/20 0247  NA 135  --  136 138 143 144  K 3.4*  --  3.9 3.1* 3.8 3.6  CL 96*  --  100 102 107 110  CO2 25  --  21* 23 23 22   GLUCOSE 113*  --  173* 186* 202* 172*  BUN 28*  --  33* 43* 48* 40*  CREATININE 1.21*  --  1.35* 1.40* 1.32* 1.18*  CALCIUM 8.3*  --  7.9* 8.1* 8.2* 8.1*  MG  --  2.2  --   --   --   --    GFR: Estimated Creatinine Clearance: 41.1 mL/min (A) (by C-G formula based on SCr of 1.18 mg/dL (H)). Liver Function Tests: Recent Labs  Lab 11/03/2020 0936 11/08/20 0043 11/09/20 0253 11/10/20 0307 11/11/20 0247  AST 179* 111* 80* 66* 69*  ALT 109* 78* 68* 63* 64*  ALKPHOS 98 81 81 90 123  BILITOT 1.1 0.6 0.6 0.7 0.6  PROT 7.5 6.6 6.4* 6.2* 6.4*  ALBUMIN 3.1*  2.6* 2.6* 2.5* 2.6*   Coagulation Profile: Recent Labs  Lab 11/06/2020 1357  INR 1.2   HbA1C: No results for input(s): HGBA1C in the last 72 hours. CBG: Recent Labs  Lab 11/10/20 0758 11/10/20 1141 11/10/20 1306 11/10/20 1619 11/11/20 0820  GLUCAP 170* 250* 202* 184* 176*   Anemia Panel: Recent Labs    11/09/20 0253 11/10/20 0307  FERRITIN 2,665* 1,591*    Recent Results (from the past 240 hour(s))  Resp Panel by RT-PCR (Flu A&B, Covid) Nasopharyngeal Swab     Status: Abnormal   Collection Time: 11/08/2020  9:10 AM   Specimen: Nasopharyngeal Swab; Nasopharyngeal(NP) swabs in vial transport medium  Result Value Ref Range Status   SARS Coronavirus 2 by RT PCR POSITIVE (A) NEGATIVE Final    Comment: RESULT  CALLED TO, READ BACK BY AND VERIFIED WITH: DR Erskine Speed LOTT @1139  ON [id number] BY COHENK (NOTE) SARS-CoV-2 target nucleic acids are DETECTED.  The SARS-CoV-2 RNA is generally detectable in upper respiratory specimens during the acute phase of infection. Positive results are indicative of the presence of the identified virus, but do not rule out bacterial infection or co-infection with other pathogens not detected by the test. Clinical correlation with patient history and other diagnostic information is necessary to determine patient infection status. The expected result is Negative.  Fact Sheet for Patients: EntrepreneurPulse.com.au  Fact Sheet for Healthcare Providers: IncredibleEmployment.be  This test is not yet approved or cleared by the [place] FDA and  has been authorized for detection and/or diagnosis of SARS-CoV-2 by FDA under an Emergency Use Authorization (EUA).  This EUA will remain in effect (meaning this tes t can be used) for the duration of  the COVID-19 declaration under Section 564(b)(1) of the Act, 21 U.S.C. section 360bbb-3(b)(1), unless the authorization is terminated or revoked sooner.     Influenza A  by PCR NEGATIVE NEGATIVE Final   Influenza B by PCR NEGATIVE NEGATIVE Final    Comment: (NOTE) The Xpert Xpress SARS-CoV-2/FLU/RSV plus assay is intended as an aid in the diagnosis of influenza from Nasopharyngeal swab specimens and should not be used as a sole basis for treatment. Nasal washings and aspirates are unacceptable for Xpert Xpress SARS-CoV-2/FLU/RSV testing.  Fact Sheet for Patients: EntrepreneurPulse.com.au  Fact Sheet for Healthcare Providers: IncredibleEmployment.be  This test is not yet approved or cleared by the [place] FDA and has been authorized for detection and/or diagnosis of SARS-CoV-2 by FDA under an Emergency Use Authorization (EUA). This EUA will remain in effect (meaning this test can be used) for the duration of the COVID-19 declaration under Section 564(b)(1) of the Act, 21 U.S.C. section 360bbb-3(b)(1), unless the authorization is terminated or revoked.  Performed at [facility], [place] [address], [place], [place] [address]   Blood Culture (routine x 2)     Status: None (Preliminary result)   Collection Time: 11/16/2020  9:10 AM   Specimen: BLOOD  Result Value Ref Range Status   Specimen Description   Final    BLOOD RIGHT ANTECUBITAL Performed at [facility] [address] [place], [place] [address]    Special Requests   Final    BOTTLES DRAWN AEROBIC AND ANAEROBIC Blood Culture results may not be optimal due to an inadequate volume of blood received in culture bottles Performed at [facility] [address] [place], [place] [address]    Culture   Final    NO GROWTH 4 DAYS Performed at [facility], [place] [address], [place], [place] [address]    Report Status PENDING  Incomplete  Blood Culture (routine x 2)     Status: None (Preliminary result)   Collection Time: 11/10/2020  9:41 AM   Specimen: BLOOD  Result Value Ref Range Status    Specimen Description   Final    BLOOD BLOOD RIGHT FOREARM Performed at [facility] [address], [place], [place] [address]    Special Requests   Final    BOTTLES DRAWN AEROBIC AND ANAEROBIC Blood Culture results may not be optimal due to an inadequate volume of blood received in culture bottles Performed at [facility] [address] [place],  [address]    Culture   Final    NO GROWTH 4 DAYS  Performed at [facility], [place] [address], [place], [place] [address]    Report Status PENDING  Incomplete  MRSA PCR Screening     Status: None   Collection Time: 11/08/2020  5:42 PM   Specimen: Nasal Mucosa; Nasopharyngeal  Result Value Ref Range Status   MRSA by PCR NEGATIVE NEGATIVE Final    Comment:        The GeneXpert MRSA Assay (FDA approved for NASAL specimens only), is one component of a comprehensive MRSA colonization surveillance program. It is not intended to diagnose MRSA infection nor to guide or monitor treatment for MRSA infections. Performed at [facility], [place] [address], [place], [place] [address]       Radiology Studies: ECHOCARDIOGRAM COMPLETE  Result Date: 11/09/2020    ECHOCARDIOGRAM REPORT   Patient Name:   Makeya S Vanatta Date of Exam: 11/09/2020 Medical Rec #:  [id number]   Height:       62.0 in Accession #:    [id number]  Weight:       212.1 lb Date of Birth:  1940/12/09   BSA:          1.960 m Patient Age:    38 years    BP:           109/67 mmHg Patient Gender: F           HR:           95 bpm. Exam Location:  Inpatient Procedure: 2D Echo, Color Doppler and Cardiac Doppler Indications:    Acute Respiratory Insufficiency 518.82 / R06.89                 Dyspnea 786.09 / R06.00  History:        Patient has prior history of Echocardiogram examinations, most                 recent 10/20/2017. Arrythmias:Atrial Fibrillation.  Sonographer:    Bernadene Person RDCS Referring Phys: [place]  1. Left ventricular ejection fraction, by estimation, is 60 to 65%. The left ventricle has normal function. The left ventricle has no regional wall motion abnormalities. There is mild left ventricular hypertrophy. Left ventricular diastolic parameters are indeterminate.  2. Right ventricular systolic function is normal. The right ventricular size is normal. There is normal pulmonary artery systolic pressure.  3. Left atrial size was mildly dilated.  4. Right atrial size was mildly dilated.  5. The mitral valve is normal in structure. Trivial mitral valve regurgitation. No evidence of mitral stenosis.  6. The aortic valve is tricuspid. Aortic valve regurgitation is not visualized. Mild aortic valve sclerosis is present, with no evidence of aortic valve stenosis.  7. The inferior vena cava is normal in size with greater than 50% respiratory variability, suggesting right atrial pressure of 3 mmHg. FINDINGS  Left Ventricle: Left ventricular ejection fraction, by estimation, is 60 to 65%. The left ventricle has normal function. The left ventricle has no regional wall motion abnormalities. The left ventricular internal cavity size was normal in size. There is  mild left ventricular hypertrophy. Left ventricular diastolic parameters are indeterminate. Right Ventricle: The right ventricular size is normal. No increase in right ventricular wall thickness. Right ventricular systolic function is normal. There is normal pulmonary artery systolic pressure. The tricuspid regurgitant velocity is 2.41 m/s, and  with an assumed right atrial pressure of 3 mmHg, the estimated right ventricular systolic pressure is 15.1 mmHg. Left Atrium: Left  atrial size was mildly dilated. Right Atrium: Right atrial size was mildly dilated. Pericardium: There is no evidence of pericardial effusion. Mitral Valve: The mitral valve is normal in structure. Trivial mitral valve regurgitation. No evidence of mitral valve stenosis. Tricuspid  Valve: The tricuspid valve is normal in structure. Tricuspid valve regurgitation is not demonstrated. No evidence of tricuspid stenosis. Aortic Valve: The aortic valve is tricuspid. Aortic valve regurgitation is not visualized. Mild aortic valve sclerosis is present, with no evidence of aortic valve stenosis. Pulmonic Valve: The pulmonic valve was normal in structure. Pulmonic valve regurgitation is not visualized. No evidence of pulmonic stenosis. Aorta: The aortic root is normal in size and structure. Venous: The inferior vena cava is normal in size with greater than 50% respiratory variability, suggesting right atrial pressure of 3 mmHg. IAS/Shunts: No atrial level shunt detected by color flow Doppler.  LEFT VENTRICLE PLAX 2D LVIDd:         3.40 cm LVIDs:         2.30 cm LV PW:         1.10 cm LV IVS:        1.20 cm LVOT diam:     1.70 cm LV SV:         48 LV SV Index:   25 LVOT Area:     2.27 cm  RIGHT VENTRICLE TAPSE (M-mode): 1.5 cm LEFT ATRIUM             Index       RIGHT ATRIUM           Index LA diam:        4.20 cm 2.14 cm/m  RA Area:     21.80 cm LA Vol (A2C):   65.5 ml 33.42 ml/m RA Volume:   60.20 ml  30.72 ml/m LA Vol (A4C):   81.9 ml 41.79 ml/m LA Biplane Vol: 80.6 ml 41.12 ml/m  AORTIC VALVE LVOT Vmax:   109.33 cm/s LVOT Vmean:  80.700 cm/s LVOT VTI:    0.213 m  AORTA Ao Root diam: 2.60 cm Ao Asc diam:  2.60 cm TRICUSPID VALVE TR Peak grad:   23.2 mmHg TR Vmax:        241.00 cm/s  SHUNTS Systemic VTI:  0.21 m Systemic Diam: 1.70 cm Jenkins Rouge MD Electronically signed by Jenkins Rouge MD Signature Date/Time: 11/09/2020/3:30:28 PM    Final     Scheduled Meds: . apixaban  5 mg Oral BID  . baricitinib  2 mg Oral Daily  . chlorhexidine  15 mL Mouth Rinse BID  . Chlorhexidine Gluconate Cloth  6 each Topical Daily  . diltiazem  240 mg Oral Daily  . insulin aspart  0-15 Units Subcutaneous TID WC  . insulin aspart  0-5 Units Subcutaneous QHS  . Ipratropium-Albuterol  1 puff Inhalation Q6H   . linagliptin  5 mg Oral Daily  . mouth rinse  15 mL Mouth Rinse q12n4p  . predniSONE  50 mg Oral Daily  . sodium chloride flush  3 mL Intravenous Q12H   Continuous Infusions: . azithromycin Stopped (11/10/20 1410)     LOS: 4 days   Time spent: 35 minutes.  Patrecia Pour, MD [facility] [email or web address] 11/11/2020, 12:32 PM

## 2020-11-11 NOTE — TOC Progression Note (Signed)
Transition of Care [facility]) - Progression Note    Patient Details  Name: Jill Cooke MRN: [id number] Date of Birth: March 07, 1941  Transition of Care [facility]) CM/SW Contact  Leeroy Cha, RN Phone Number: 11/11/2020, 10:12 AM  Clinical Narrative:    Acute hypoxemic respiratory failure due to covid-19 pneumonia and suspected superimposed bacterial CAP: SARS-CoV-2 PCR positive on 12/9. PCT elevated at admission.  - Continue remdesivir (12/9 - 12/13) - Continue steroids at ~1mg /kg q12h. CRP improving - Continue baricitinib, started 12/9 due to patient's severity of illness.  - Plan to continue abx empirically x5 days. PCT responded - Encourage OOB, IS, FV, and awake proning if able - Continue airborne, contact precautions for 21 days from positive testing. Plan is top return to home Following for progression  Expected Discharge Plan: Home/Self Care Barriers to Discharge: No Barriers Identified  Expected Discharge Plan and Services Expected Discharge Plan: Home/Self Care   Discharge Planning Services: CM Consult   Living arrangements for the past 2 months: Single Family Home                                       Social Determinants of Health (SDOH) Interventions    Readmission Risk Interventions No flowsheet data found.

## 2020-11-12 ENCOUNTER — Inpatient Hospital Stay (HOSPITAL_COMMUNITY): Payer: PPO

## 2020-11-12 DIAGNOSIS — U071 COVID-19: Principal | ICD-10-CM

## 2020-11-12 DIAGNOSIS — J9601 Acute respiratory failure with hypoxia: Secondary | ICD-10-CM

## 2020-11-12 DIAGNOSIS — N179 Acute kidney failure, unspecified: Secondary | ICD-10-CM

## 2020-11-12 DIAGNOSIS — I4891 Unspecified atrial fibrillation: Secondary | ICD-10-CM

## 2020-11-12 LAB — C-REACTIVE PROTEIN: CRP: 11.1 mg/dL — ABNORMAL HIGH (ref <1.0)

## 2020-11-12 LAB — COMPREHENSIVE METABOLIC PANEL
ALT: 65 U/L — ABNORMAL HIGH (ref 0–44)
AST: 99 U/L — ABNORMAL HIGH (ref 15–41)
Albumin: 2.7 g/dL — ABNORMAL LOW (ref 3.5–5.0)
Alkaline Phosphatase: 157 U/L — ABNORMAL HIGH (ref 38–126)
Anion gap: 14 (ref 5–15)
BUN: 63 mg/dL — ABNORMAL HIGH (ref 8–23)
CO2: 20 mmol/L — ABNORMAL LOW (ref 22–32)
Calcium: 8.1 mg/dL — ABNORMAL LOW (ref 8.9–10.3)
Chloride: 112 mmol/L — ABNORMAL HIGH (ref 98–111)
Creatinine, Ser: 2.48 mg/dL — ABNORMAL HIGH (ref 0.44–1.00)
GFR, Estimated: 19 mL/min — ABNORMAL LOW (ref >60)
Glucose, Bld: 191 mg/dL — ABNORMAL HIGH (ref 70–99)
Potassium: 3.8 mmol/L (ref 3.5–5.1)
Sodium: 146 mmol/L — ABNORMAL HIGH (ref 135–145)
Total Bilirubin: 1.8 mg/dL — ABNORMAL HIGH (ref 0.3–1.2)
Total Protein: 6.4 g/dL — ABNORMAL LOW (ref 6.5–8.1)

## 2020-11-12 LAB — GLUCOSE, CAPILLARY
Glucose-Capillary: 170 mg/dL — ABNORMAL HIGH (ref 70–99)
Glucose-Capillary: 161 mg/dL — ABNORMAL HIGH (ref 70–99)
Glucose-Capillary: 167 mg/dL — ABNORMAL HIGH (ref 70–99)
Glucose-Capillary: 153 mg/dL — ABNORMAL HIGH (ref 70–99)

## 2020-11-12 LAB — CULTURE, BLOOD (ROUTINE X 2)
Culture: NO GROWTH
Culture: NO GROWTH

## 2020-11-12 MED ORDER — LACTATED RINGERS IV SOLN: INTRAVENOUS | Status: DC

## 2020-11-12 MED ORDER — APIXABAN 2.5 MG PO TABS
2.5000 mg | ORAL_TABLET | Freq: Two times a day (BID) | ORAL | Status: DC
  Administered 2020-11-12 – 2020-11-13 (×2): 2.5 mg via ORAL
  Filled 2020-11-12 (×2): qty 1

## 2020-11-12 MED ORDER — BARICITINIB 1 MG PO TABS
1.0000 mg | ORAL_TABLET | Freq: Every day | ORAL | Status: DC
  Administered 2020-11-12 – 2020-11-13 (×2): 1 mg via ORAL
  Filled 2020-11-12 (×2): qty 1

## 2020-11-12 MED ORDER — LACTATED RINGERS IV BOLUS
250.0000 mL | Freq: Once | INTRAVENOUS | Status: AC
  Administered 2020-11-12: 09:00:00 250 mL via INTRAVENOUS

## 2020-11-12 NOTE — Progress Notes (Signed)
PROGRESS NOTE  Jill Cooke  LZJ:[id number] DOB: 1941/08/19 DOA: 11/06/2020 PCP: Katherina Mires, MD   Brief Narrative: Jill Cooke is an 79 y.o. female with a history of atrial fibrillation on eliquis, and HTN who presented to the ED 12/9 with dyspnea and cough found to be severely hypoxic, tachypneic with multifocal infiltrates on CXR and positive SARS-CoV-2 PCR. CRP 19.3, d-dimer 4.7, PCT 0.54. Full code was confirmed, PCCM notified, though patient ultimately admitted to hospitalist service on 40L HHFNC O2, started on remdesivir, solumedrol, baricitinib, empiric antibiotics, and therapeutic heparin. Remains at high risk of intubation, hypoxia worsening 12/14 and urine output decreased with worsening renal function, starting IVF.  Assessment & Plan: Principal Problem:   Acute hypoxemic respiratory failure due to COVID-19 [facility]) Active Problems:   Atrial fibrillation with RVR (HCC)   Essential (primary) hypertension   Encephalopathy due to COVID-19 virus   AKI (acute kidney injury) ([place])   CKD (chronic kidney disease), stage III (HCC)   Hypokalemia  Acute hypoxemic respiratory failure due to covid-19 pneumonia and suspected superimposed bacterial CAP: SARS-CoV-2 PCR positive on 12/9. PCT elevated at admission.  - CCM consulted for worsening hypoxemia and work of breathing. Pt at high risk of intubation. - S/p remdesivir (12/9 - 12/13) - Continue steroids, baricitinib - Completed 5 days abx .  - Encourage OOB, IS, FV, and awake proning if able - Continue airborne, contact precautions for 21 days from positive testing.  Elevated d-dimer: Remains too unstable for CTA and renal function worsened.  - Negative LE venous U/S, echocardiogram without any RV strain, with normal PASP. Continue eliquis   Atrial fibrillation with RVR:  - Restarted home diltiazem. Rate control improved. - Restart home eliquis. - prn labetalol  Prediabetes with steroid-induced hyperglycemia: HbA1c 6.3%. - Continue  SSI with CBGs at inpatient goal.  - Added linagliptin  AKI on stage IIIa CKD, urinary retention, hypernatremia: Required catheterization 12/13.  - Renal function initially stabilized, now worsening with poor per oral intake related to respiratory distress. Start LR with small bolus and continuous rate, monitor UOP. Place foley if no spontaneous UOP this morning. - Renal U/S  Acute hypoxic encephalopathy: Mentation normalized with oxygen supplementation.  - Delirium precautions.  - Maintains capacity to make medical decisions though hypoxic encephalopathy could worsen if hypoxia continues progressing.  LFT elevation: Likely due to covid-19, improving.  - Monitor while on therapies as above.   - With rising TBili and alk phos, check RUQ U/S. Abd exam benign this AM  HTN:  - Continue medications as above  Hypokalemia: Supplemented.  - Resolved, continue monitoring. Mg 2.2.   Morbid obesity: Estimated body mass index is 38.79 kg/m as calculated from the following:   Height as of this encounter: 5' 2"  (1.575 m).   Weight as of this encounter: 96.2 kg.  DVT prophylaxis: Eliquis Code Status: Full Family Communication: None. Continues to decline a call to her family. Will check back in later today. Disposition Plan: Remain in ICU Status is: Inpatient  Remains inpatient appropriate because:Hemodynamically unstable and Inpatient level of care appropriate due to severity of illness  Dispo: The patient is from: Home              Anticipated d/c is to: TBD              Anticipated d/c date is: > 3 days              Patient currently is not medically stable to  d/c.  Consultants:   PCCM  Procedures:   None  Antimicrobials:  Remdesivir  Ceftriaxone  Azithromycin   Subjective: UOP decreased yesterday and retained urine, now states she feels about the same with dyspnea at rest, not worse but looks much more tired. No chest pain or abdominal pain. Has not been eating or  drinking.  Objective: Vitals:   11/12/20 0400 11/12/20 0500 11/12/20 0600 11/12/20 0634  BP: (!) 159/94 120/64 (!) 136/102 (!) 129/95  Pulse:  (!) 129 (!) 110 (!) 120  Resp: (!) 24 (!) 25 (!) 28 20  Temp: 98.2 F (36.8 C)     TempSrc: Axillary     SpO2:  (!) 89% (!) 85% 90%  Weight:      Height:        Intake/Output Summary (Last 24 hours) at 11/12/2020 0857 Last data filed at 11/12/2020 0500 Gross per 24 hour  Intake 93 ml  Output 900 ml  Net -807 ml   Filed Weights   11/05/2020 0908 11/20/2020 1706  Weight: 102.1 kg 96.2 kg   Gen: 79 y.o. female in no distress Pulm: Tachypneic speaking only a few words at a time with crackles diffusely, no wheezes. CV: Regular tachycardia. No murmur, rub, or gallop. No JVD, no pitting dependent edema. GI: Abdomen soft, non-tender, no RUQ tenderness, non-distended, with normoactive bowel sounds.  Ext: Warm, no deformities Skin: No rashes, lesions or ulcers on visualized skin. Neuro: Drowsy but rousable and oriented. No definite focal neurological deficits. Psych: Judgement and insight appear fair.   Data Reviewed: I have personally reviewed following labs and imaging studies  CBC: Recent Labs  Lab 11/17/2020 0936 11/08/20 0043 11/09/20 0253 11/10/20 0307  WBC 9.1 6.3 9.2 15.1*  NEUTROABS 8.2* 5.5 8.0* 13.5*  HGB 16.5* 15.4* 13.9 13.8  HCT 48.9* 47.0* 42.1 42.0  MCV 90.7 92.2 91.3 91.9  PLT 264 251 329 573   Basic Metabolic Panel: Recent Labs  Lab 11/29/2020 1357 11/08/20 0043 11/09/20 0253 11/10/20 0307 11/11/20 0247 11/12/20 0235  NA  --  136 138 143 144 146*  K  --  3.9 3.1* 3.8 3.6 3.8  CL  --  100 102 107 110 112*  CO2  --  21* 23 23 22  20*  GLUCOSE  --  173* 186* 202* 172* 191*  BUN  --  33* 43* 48* 40* 63*  CREATININE  --  1.35* 1.40* 1.32* 1.18* 2.48*  CALCIUM  --  7.9* 8.1* 8.2* 8.1* 8.1*  MG 2.2  --   --   --   --   --    GFR: Estimated Creatinine Clearance: 19.6 mL/min (A) (by C-G formula based on SCr of 2.48  mg/dL (H)). Liver Function Tests: Recent Labs  Lab 11/08/20 0043 11/09/20 0253 11/10/20 0307 11/11/20 0247 11/12/20 0235  AST 111* 80* 66* 69* 99*  ALT 78* 68* 63* 64* 65*  ALKPHOS 81 81 90 123 157*  BILITOT 0.6 0.6 0.7 0.6 1.8*  PROT 6.6 6.4* 6.2* 6.4* 6.4*  ALBUMIN 2.6* 2.6* 2.5* 2.6* 2.7*   Coagulation Profile: Recent Labs  Lab 11/06/2020 1357  INR 1.2   HbA1C: No results for input(s): HGBA1C in the last 72 hours. CBG: Recent Labs  Lab 11/11/20 0820 11/11/20 1237 11/11/20 1628 11/11/20 2151 11/12/20 0819  GLUCAP 176* 196* 164* 158* 170*   Anemia Panel: Recent Labs    11/10/20 0307  FERRITIN 1,591*    Recent Results (from the past 240 hour(s))  Resp Panel  by RT-PCR (Flu A&B, Covid) Nasopharyngeal Swab     Status: Abnormal   Collection Time: 11/16/2020  9:10 AM   Specimen: Nasopharyngeal Swab; Nasopharyngeal(NP) swabs in vial transport medium  Result Value Ref Range Status   SARS Coronavirus 2 by RT PCR POSITIVE (A) NEGATIVE Final    Comment: RESULT CALLED TO, READ BACK BY AND VERIFIED WITH: DR Erskine Speed LOTT @1139  ON [id number] BY COHENK (NOTE) SARS-CoV-2 target nucleic acids are DETECTED.  The SARS-CoV-2 RNA is generally detectable in upper respiratory specimens during the acute phase of infection. Positive results are indicative of the presence of the identified virus, but do not rule out bacterial infection or co-infection with other pathogens not detected by the test. Clinical correlation with patient history and other diagnostic information is necessary to determine patient infection status. The expected result is Negative.  Fact Sheet for Patients: EntrepreneurPulse.com.au  Fact Sheet for Healthcare Providers: IncredibleEmployment.be  This test is not yet approved or cleared by the [place] FDA and  has been authorized for detection and/or diagnosis of SARS-CoV-2 by FDA under an Emergency Use Authorization  (EUA).  This EUA will remain in effect (meaning this tes t can be used) for the duration of  the COVID-19 declaration under Section 564(b)(1) of the Act, 21 U.S.C. section 360bbb-3(b)(1), unless the authorization is terminated or revoked sooner.     Influenza A by PCR NEGATIVE NEGATIVE Final   Influenza B by PCR NEGATIVE NEGATIVE Final    Comment: (NOTE) The Xpert Xpress SARS-CoV-2/FLU/RSV plus assay is intended as an aid in the diagnosis of influenza from Nasopharyngeal swab specimens and should not be used as a sole basis for treatment. Nasal washings and aspirates are unacceptable for Xpert Xpress SARS-CoV-2/FLU/RSV testing.  Fact Sheet for Patients: EntrepreneurPulse.com.au  Fact Sheet for Healthcare Providers: IncredibleEmployment.be  This test is not yet approved or cleared by the [place] FDA and has been authorized for detection and/or diagnosis of SARS-CoV-2 by FDA under an Emergency Use Authorization (EUA). This EUA will remain in effect (meaning this test can be used) for the duration of the COVID-19 declaration under Section 564(b)(1) of the Act, 21 U.S.C. section 360bbb-3(b)(1), unless the authorization is terminated or revoked.  Performed at [facility], [place] [address] [place], [place] [address]   Blood Culture (routine x 2)     Status: None (Preliminary result)   Collection Time: 11/22/2020  9:10 AM   Specimen: BLOOD  Result Value Ref Range Status   Specimen Description   Final    BLOOD RIGHT ANTECUBITAL Performed at [place] [address], [place], [place] [address]    Special Requests   Final    BOTTLES DRAWN AEROBIC AND ANAEROBIC Blood Culture results may not be optimal due to an inadequate volume of blood received in culture bottles Performed at [place] [address] [place], [place] [address]    Culture   Final    NO GROWTH 4 DAYS Performed  at [facility], [place] [address], [place], [place] [address]    Report Status PENDING  Incomplete  Blood Culture (routine x 2)     Status: None (Preliminary result)   Collection Time: 11/20/2020  9:41 AM   Specimen: BLOOD  Result Value Ref Range Status   Specimen Description   Final    BLOOD BLOOD RIGHT FOREARM Performed at [place] [address], [place], [place] [address]    Special Requests  Final    BOTTLES DRAWN AEROBIC AND ANAEROBIC Blood Culture results may not be optimal due to an inadequate volume of blood received in culture bottles Performed at [facility], [place] [address] [place], [place] [address]    Culture   Final    NO GROWTH 4 DAYS Performed at [facility], [place] [address], [place], [place] [address]    Report Status PENDING  Incomplete  MRSA PCR Screening     Status: None   Collection Time: 11/09/2020  5:42 PM   Specimen: Nasal Mucosa; Nasopharyngeal  Result Value Ref Range Status   MRSA by PCR NEGATIVE NEGATIVE Final    Comment:        The GeneXpert MRSA Assay (FDA approved for NASAL specimens only), is one component of a comprehensive MRSA colonization surveillance program. It is not intended to diagnose MRSA infection nor to guide or monitor treatment for MRSA infections. Performed at [facility], [place] [address] [place],  [address]       Radiology Studies: No results found.  Scheduled Meds:  apixaban  5 mg Oral BID   baricitinib  1 mg Oral Daily   chlorhexidine  15 mL Mouth Rinse BID   Chlorhexidine Gluconate Cloth  6 each Topical Daily   diltiazem  240 mg Oral Daily   insulin aspart  0-15 Units Subcutaneous TID WC   insulin aspart  0-5 Units Subcutaneous QHS   Ipratropium-Albuterol  1 puff Inhalation Q6H   linagliptin  5 mg Oral Daily   mouth rinse  15 mL Mouth Rinse q12n4p   predniSONE  50 mg Oral Daily   sodium chloride flush  3 mL Intravenous Q12H    Continuous Infusions:  lactated ringers 100 mL/hr at 11/12/20 0838     LOS: 5 days   Time spent: 35 minutes.  Patrecia Pour, MD [facility] [email or web address] 11/12/2020, 8:57 AM

## 2020-11-12 NOTE — Consult Note (Signed)
NAME:  Jill Cooke, MRN:  [id number], DOB:  1941-08-28, LOS: 5 ADMISSION DATE:  11/22/2020, CONSULTATION DATE:  12/14 REFERRING MD:  Bonner Puna, CHIEF COMPLAINT:  Dyspnea   Brief History   79 y/o femal admitted on 12/9 with severe acute respiratory failure with hypoxemia due to COVID 19 pneumonia.   History of present illness   79 y/o female with obesity and atrial fibrillation presented to the [facility] ER on 12/9 complaining of dyspnea, found to have COVID pneumonia.  Admitted by the hospitalist service and has been treated with solumedrol 1mg /kg, baricitinib, and remdesivir.  PCCM was consulted on 12/14 due to worsening hypoxemic respiratory failure.  Her daughter tells me that she developed symptoms around Thanksgiving and her condition has worsened since then to the point that she came to the ER on 12/9.  Her daughter says that her symptoms started as head cold with sinus congestion.  She says that during this time her mother didn't answer the phone much during that time.  She says that at baseline her mother is fairly sedentary.    Past Medical History  Paroxysmal atrial fibrillation Endometrial hyperplasia/malignancy Anxiety Obesity due to excess calories  Significant Hospital Events   12/9 admission 12/14 PCCM consultation, goals of care conversation, discussion with family, DNR  Consults:  PCCM  Procedures:    Significant Diagnostic Tests:  12/10 lower ext doppler > no DVT in lower ext bilaterally 12/11 echo> LVEF 60-65%, mild LVH, normal PASP, LA mild dilated, RA mild dilated, valves OK,  12/14 abdominal ultrasound >>>  Micro Data:  12/9 sars cov 2 > pos, flu neg 12/9 blood >   Antimicrobials:  12/9 baricitinib >  12/9 solumedrol 1mg /kg>  12/9 remdesivir >  12/9 ceftriaxone >  12/9 azithromcyin>     Interim history/subjective:  As above  Objective   Blood pressure (!) 176/67, pulse (!) 174, temperature 97.7 F (36.5 C), temperature source Axillary, resp. rate (!) 26,  height 5\' 2"  (1.575 m), weight 96.2 kg, SpO2 (!) 79 %.    FiO2 (%):  [100 %] 100 %   Intake/Output Summary (Last 24 hours) at 11/12/2020 0951 Last data filed at 11/12/2020 0500 Gross per 24 hour  Intake 93 ml  Output 900 ml  Net -807 ml   Filed Weights   11/11/2020 0908 11/22/2020 1706  Weight: 102.1 kg 96.2 kg    Examination:  General: obese female, increased work of breathing in bed, speaking in full sentences HENT: NCAT OP clear PULM: Crackles bases B, increased effort CV: Irreg irreg, tachycardic, no mgr GI: BS+, soft, nontender MSK: normal bulk and tone Neuro: awake, alert, inattentive to conversation, topic of conversation wanders after off topic about 30 seconds, MAEW  12/14 CXR> severe diffuse bilateral airspace disease  Resolved Hospital Problem list     Assessment & Plan:  ARDS due to COVID 19 pneumonia: severe; considering her obesity, advanced age and physical deconditioning at baseline, the likelihood of survival here is very low.  She would not survive mechanical ventilation if it came to that.  I discussed this with her daughter and her in detail and both agree that code status should be DNR, but continue full support otherwise. > continue baricitinib > continue solumedrol as you are doing > continue heated high flow O2 to maintain O2 saturation > 85% > incentive spirometry > out of bed as able  AKI > hold diuresis > agree with gentle IV fluids  Code status: DNR I updated her daughter Kathlee Nations  by phone today in detail.  I recommended that she ask her brother from [place] to come in.  If her mother's condition declines and and we decide that we need to focus on comfort measures then she would be willing to come in for a visit.   Best practice (evaluated daily)   Per Primary  Labs   CBC: Recent Labs  Lab 11/10/2020 0936 11/08/20 0043 11/09/20 0253 11/10/20 0307  WBC 9.1 6.3 9.2 15.1*  NEUTROABS 8.2* 5.5 8.0* 13.5*  HGB 16.5* 15.4* 13.9 13.8  HCT 48.9*  47.0* 42.1 42.0  MCV 90.7 92.2 91.3 91.9  PLT 264 251 329 027    Basic Metabolic Panel: Recent Labs  Lab 11/08/2020 1357 11/08/20 0043 11/09/20 0253 11/10/20 0307 11/11/20 0247 11/12/20 0235  NA  --  136 138 143 144 146*  K  --  3.9 3.1* 3.8 3.6 3.8  CL  --  100 102 107 110 112*  CO2  --  21* 23 23 22  20*  GLUCOSE  --  173* 186* 202* 172* 191*  BUN  --  33* 43* 48* 40* 63*  CREATININE  --  1.35* 1.40* 1.32* 1.18* 2.48*  CALCIUM  --  7.9* 8.1* 8.2* 8.1* 8.1*  MG 2.2  --   --   --   --   --    GFR: Estimated Creatinine Clearance: 19.6 mL/min (A) (by C-G formula based on SCr of 2.48 mg/dL (H)). Recent Labs  Lab 11/25/2020 0910 11/05/2020 0936 11/10/2020 1247 11/08/20 0043 11/09/20 0253 11/10/20 0307  PROCALCITON  --  0.54  --  0.41  --   --   WBC  --  9.1  --  6.3 9.2 15.1*  LATICACIDVEN 1.9  --  1.7  --   --   --     Liver Function Tests: Recent Labs  Lab 11/08/20 0043 11/09/20 0253 11/10/20 0307 11/11/20 0247 11/12/20 0235  AST 111* 80* 66* 69* 99*  ALT 78* 68* 63* 64* 65*  ALKPHOS 81 81 90 123 157*  BILITOT 0.6 0.6 0.7 0.6 1.8*  PROT 6.6 6.4* 6.2* 6.4* 6.4*  ALBUMIN 2.6* 2.6* 2.5* 2.6* 2.7*   No results for input(s): LIPASE, AMYLASE in the last 168 hours. No results for input(s): AMMONIA in the last 168 hours.  ABG    Component Value Date/Time   PHART 7.462 (H) 11/11/2020 2008   PCO2ART 33.1 11/11/2020 2008   PO2ART 66.6 (L) 11/11/2020 2008   HCO3 23.5 11/11/2020 2008   O2SAT 93.7 11/11/2020 2008     Coagulation Profile: Recent Labs  Lab 11/26/2020 1357  INR 1.2    Cardiac Enzymes: No results for input(s): CKTOTAL, CKMB, CKMBINDEX, TROPONINI in the last 168 hours.  HbA1C: Hgb A1c MFr Bld  Date/Time Value Ref Range Status  11/08/2020 12:43 AM 6.3 (H) 4.8 - 5.6 % Final    Comment:    (NOTE) Pre diabetes:          5.7%-6.4%  Diabetes:              >6.4%  Glycemic control for   <7.0% adults with diabetes     CBG: Recent Labs  Lab  11/11/20 0820 11/11/20 1237 11/11/20 1628 11/11/20 2151 11/12/20 0819  GLUCAP 176* 196* 164* 158* 170*    Review of Systems:   Gen: per HPI HEENT: Denies blurred vision, double vision, hearing loss, tinnitus, sinus congestion, rhinorrhea, sore throat, neck stiffness, dysphagia PULM: per HPI CV: Denies chest pain, edema,  orthopnea, paroxysmal nocturnal dyspnea, palpitations GI: Denies abdominal pain, nausea, vomiting, diarrhea, hematochezia, melena, constipation, change in bowel habits GU: Denies dysuria, hematuria, polyuria, oliguria, urethral discharge Endocrine: Denies hot or cold intolerance, polyuria, polyphagia or appetite change Derm: Denies rash, dry skin, scaling or peeling skin change Heme: Denies easy bruising, bleeding, bleeding gums Neuro: Denies headache, numbness, weakness, slurred speech, loss of memory or consciousness   Past Medical History  She,  has a past medical history of Anxiety, Cancer ([place]), Endometrial hyperplasia, PAF (paroxysmal atrial fibrillation) ([place]), Postmenopausal bleeding, Retroversion of uterus, Vaginal bleeding, and Vulvar ulceration.   Surgical History    Past Surgical History:  Procedure Laterality Date  . DILATION AND CURETTAGE OF UTERUS  08/09/2012   Procedure: DILATATION AND CURETTAGE;  Surgeon: Janie Morning, MD PHD;  Location: [facility];  Service: Gynecology;  Laterality: N/A;  . LYMPH NODE DISSECTION  09/13/2012   Procedure: LYMPH NODE DISSECTION;  Surgeon: Imagene Gurney A. Alycia Rossetti, MD;  Location: [facility];  Service: Gynecology;  Laterality: Bilateral;  bilateral pelvic lymph node dissection  . ROBOTIC ASSISTED LAP VAGINAL HYSTERECTOMY  09/13/2012   Procedure: ROBOTIC ASSISTED LAPAROSCOPIC VAGINAL HYSTERECTOMY;  Surgeon: Imagene Gurney A. Alycia Rossetti, MD;  Location: [facility];  Service: Gynecology;  Laterality: N/A;     Social History   reports that she has never smoked. She has never used smokeless tobacco. She reports that she does not drink alcohol and does not  use drugs.   Family History   Her family history is not on file.   Allergies No Known Allergies   Home Medications  Prior to Admission medications   Medication Sig Start Date End Date Taking? Authorizing Provider  apixaban (ELIQUIS) 5 MG TABS tablet Take 1 tablet (5 mg total) by mouth 2 (two) times daily. 09/18/20  Yes Burtis Junes, NP  Cholecalciferol (VITAMIN D3 PO) Take by mouth.   Yes [provider]  diltiazem (CARDIZEM CD) 240 MG 24 hr capsule TAKE 1 CAPSULE BY MOUTH EVERY DAY Patient taking differently: Take 240 mg by mouth daily. 09/19/20  Yes Burtis Junes, NP  vitamin B-12 (CYANOCOBALAMIN) 1000 MCG tablet Take 1,000 mcg by mouth daily.   Yes [provider]  Zinc Sulfate 66 MG TABS Take 66 mg by mouth daily.   Yes [provider]     Critical care time: 50 minutes    Roselie Awkward, MD [place] PCCM Pager: [phone] Cell: [phone] If no response, call [phone]

## 2020-11-12 NOTE — Progress Notes (Signed)
11/12/2020 Bladder scan completed with 11mL urine in bladder, verified by second RN. Discussed with charge nurse Jerrye Bushy RN; scheduled repeat bladder scan in 4 hours. Cindy S. Brigitte Pulse BSN, RN, [place] 11/12/2020 4:24 AM

## 2020-11-12 NOTE — Progress Notes (Signed)
ANTICOAGULATION CONSULT NOTE - Follow Up Consult  Pharmacy Consult for apixaban Indication: Afib  No Known Allergies  Patient Measurements: Height: 5\' 2"  (157.5 cm) Weight: 96.2 kg (212 lb 1.3 oz) IBW/kg (Calculated) : 50.1 Heparin Dosing Weight: 73 kg  Vital Signs: Temp: 97.1 F (36.2 C) (12/14 1200) Temp Source: Axillary (12/14 1200) BP: 122/85 (12/14 1300) Pulse Rate: 99 (12/14 1300)  Labs: Recent Labs    11/10/20 0307 11/11/20 0247 11/12/20 0235  HGB 13.8  --   --   HCT 42.0  --   --   PLT 376  --   --   HEPARINUNFRC 0.41  --   --   CREATININE 1.32* 1.18* 2.48*    Estimated Creatinine Clearance: 19.6 mL/min (A) (by C-G formula based on SCr of 2.48 mg/dL (H)).   Medications:  Infusions:   lactated ringers 100 mL/hr at 11/12/20 0950   predniSONE      Assessment: 57 yoF admitted on 12/9 with COVID pneumonia.  PMH includes AFib on chronic apixaban anticoagulation.  Med rec is in process of completion but pt's daughter states that pt has not had  Eliquis in > 1 week as pt has not been feeling well. Pharmacy is consulted to dose heparin for empiric treatment of suspected PE.  CT unable to be obtained.   Baseline labs : INR 1.2, PT 14.8, aPTT 31, HL is < 0.10, confirming  no recent apixaban intake    12/10 Vascular US: negative for DVT  Today, 11/12/2020: SCr doubled No bleeding reported  Plan:  Reduce apixaban dosing to 2.5 mg bid and f/u renal function to change back to pta dosing once AKI resolved  Napoleon Form  11/12/2020 2:26 PM

## 2020-11-12 NOTE — Progress Notes (Signed)
I visited the patient again this afternoon. She remains hypoxic despite maximal oxygen supplementation, intermittently taking NRB off with subsequent desaturations. Denies shortness of breath, just trying to get a comfortable position. She denies chest or any other pain. She looks miserable, tachypneic in some respiratory distress with crackles diffusely. CXR this AM showed progressive worsening infiltrates now suggestive of ARDS. PCCM consulted, confirmed the patient's poor prognosis regardless of decision to intubate. The patient agreed to DNR/DNI as did her 62, daughter.   I've spoken to her daughter again this evening and conveyed my fear that this is likely nearing end of life, offered visitation to that end. She does not have any covid symptoms or diagnosis, and will come visit tonight. She'll call her brother in [place] to come visit, though this patient's prognosis may be too limited for his visitation. We will continue current mode of therapy until daughter arrives, then I have asked the RN to page either myself or night coverage to pursue comfort measure orders.  Vance Gather, MD 11/12/2020 5:48 PM

## 2020-11-13 LAB — GLUCOSE, CAPILLARY
Glucose-Capillary: 175 mg/dL — ABNORMAL HIGH (ref 70–99)
Glucose-Capillary: 141 mg/dL — ABNORMAL HIGH (ref 70–99)

## 2020-11-13 MED ORDER — HALOPERIDOL LACTATE 5 MG/ML IJ SOLN
2.0000 mg | INTRAMUSCULAR | Status: DC | PRN
  Administered 2020-11-13 (×3): 2 mg via INTRAVENOUS
  Filled 2020-11-13 (×2): qty 1

## 2020-11-13 MED ORDER — MORPHINE 100MG IN NS 100ML (1MG/ML) PREMIX INFUSION
5.0000 mg/h | INTRAVENOUS | Status: DC
  Administered 2020-11-13: 21:00:00 1 mg/h via INTRAVENOUS
  Filled 2020-11-13: qty 100

## 2020-11-13 MED ORDER — LORAZEPAM 2 MG/ML IJ SOLN
1.0000 mg | Freq: Once | INTRAMUSCULAR | Status: AC
  Administered 2020-11-13: 21:00:00 1 mg via INTRAVENOUS

## 2020-11-13 MED ORDER — HALOPERIDOL LACTATE 5 MG/ML IJ SOLN
INTRAMUSCULAR | Status: AC
  Filled 2020-11-13: qty 1

## 2020-11-13 MED ORDER — MORPHINE SULFATE (PF) 2 MG/ML IV SOLN
1.0000 mg | INTRAVENOUS | Status: DC | PRN
  Administered 2020-11-13 (×3): 1 mg via INTRAVENOUS
  Filled 2020-11-13 (×3): qty 1

## 2020-11-13 MED ORDER — LORAZEPAM 2 MG/ML IJ SOLN
0.5000 mg | Freq: Once | INTRAMUSCULAR | Status: AC
  Administered 2020-11-13: 03:00:00 0.5 mg via INTRAVENOUS
  Filled 2020-11-13: qty 1

## 2020-11-13 MED ORDER — LORAZEPAM 2 MG/ML IJ SOLN
INTRAMUSCULAR | Status: AC
  Filled 2020-11-13: qty 1

## 2020-11-13 MED ORDER — METHYLPREDNISOLONE SODIUM SUCC 40 MG IJ SOLR
40.0000 mg | Freq: Three times a day (TID) | INTRAMUSCULAR | Status: DC
  Administered 2020-11-13: 12:00:00 40 mg via INTRAVENOUS
  Filled 2020-11-13: qty 1

## 2020-11-14 DIAGNOSIS — K8020 Calculus of gallbladder without cholecystitis without obstruction: Secondary | ICD-10-CM

## 2020-11-14 DIAGNOSIS — E870 Hyperosmolality and hypernatremia: Secondary | ICD-10-CM

## 2020-11-14 DIAGNOSIS — R7401 Elevation of levels of liver transaminase levels: Secondary | ICD-10-CM

## 2020-11-14 LAB — GLUCOSE, CAPILLARY: Glucose-Capillary: 148 mg/dL — ABNORMAL HIGH (ref 70–99)

## 2020-11-30 NOTE — Progress Notes (Signed)
 PROGRESS NOTE  Jill Cooke  UVO:[id number] DOB: [birth year] DOA: 11/02/2020 PCP: Katherina Mires, MD   Brief Narrative:  Jill Cooke is an 80 y.o. female with a history of atrial fibrillation on eliquis, and HTN who presented to the ED 12/9 with dyspnea and cough found to be severely hypoxic, tachypneic with multifocal infiltrates on CXR and positive SARS-CoV-2 PCR. CRP 19.3, d-dimer 4.7, PCT 0.54.  Patient with severe COVID-19 of pneumonia and parenchymal lung injury, with severe oxygen requirement, she was admitted to stepdown, treated with Remdesivir, baricitinib, antibiotics and steroids, with no improvement of her symptoms, and progressive increase in oxygen requirement, as well with worsening renal failure, PCCM were consulted, extremely poor prognosis, PCCM were consulted, likelihood of survival is very low, and she would not survive mechanical ventilation if it came to that, she is currently DNR .  Subjective:  Patient with significant agitation and delirium overnight, where she had to be on restraints, he is with hypoxia despite being on maximal oxygen delivery.  Assessment & Plan: Principal Problem:   Acute hypoxemic respiratory failure due to COVID-19 [facility]) Active Problems:   Atrial fibrillation with RVR (HCC)   Essential (primary) hypertension   Encephalopathy due to COVID-19 virus   AKI (acute kidney injury) ([place])   CKD (chronic kidney disease), stage III (HCC)   Hypokalemia  Acute hypoxemic respiratory failure due to covid-19 pneumonia and suspected superimposed bacterial CAP: SARS-CoV-2 PCR positive on 12/9. PCT elevated at admission.  -She is unvaccinated -He is with severe COVID-19 of pneumonia, severe lung parenchymal injury due to COVID-19, with severe oxygen requirement, he remains hypoxic on 70 L heated high flow nasal cannula/100% and 15 L NRB, PCCM input greatly appreciated, she will not survive intubation, and unlikely to survive this hospitalization. - S/p remdesivir  (12/9 - 12/13) - Continue steroids, baricitinib - Completed 5 days abx .   SpO2: (!) 83 % O2 Flow Rate (L/min): 70 L/min FiO2 (%): 100 %  Elevated d-dimer: Remains too unstable for CTA and renal function worsened.  - Negative LE venous U/S, echocardiogram without any RV strain, with normal PASP. Continue eliquis   Atrial fibrillation with RVR:  - Restarted home diltiazem. Rate control improved. - Restart home eliquis. - prn labetalol  Prediabetes with steroid-induced hyperglycemia: HbA1c 6.3%. - Continue SSI with CBGs at inpatient goal.  - Added linagliptin  AKI on stage IIIa CKD, urinary retention, hypernatremia: Required catheterization 12/13.  - Renal function initially stabilized, now worsening with poor per oral intake related to respiratory distress. Start LR with small bolus and continuous rate, monitor UOP. Place foley if no spontaneous UOP this morning.  Acute hypoxic/metabolic encephalopathy: -Extremely delirious/agitated overnight, pulling his mask, factorial, in the setting of COVID-19 encephalopathy and hospital delirium. -She is on restraints currently, will add as needed IV Haldol and morphine.   LFT elevation:  - Likely due to covid  HTN:  - Continue medications as above  Hypokalemia:  -Repleted as needed  Goals of care  -Patient with multiple comorbidities, severe deconditioning, progressive hypoxia, severe COVID-19 of pneumonia, severe hypoxia on 70 L heated high flow +15 NRB, even with that she remains hypoxic, PCCM input greatly appreciated, she survive intubation, and she is unlikely to survive this illness, I have discussed this with daughter, she is currently DNR, ultimate goal of care is comfort, but awaiting for other family members (including son who will be here in the next 12 to 14 hours, for now we will continue with  current measures, oxygen supplementation, and medical management, once family visitation is done likely will proceed to full comfort  measures.  Morbid obesity: Estimated body mass index is 38.79 kg/m as calculated from the following:   Height as of this encounter: 5\' 2"  (1.575 m).   Weight as of this encounter: 96.2 kg.  DVT prophylaxis: Eliquis Code Status: Full Family Communication: Discussed with daughter Benjamine Mola by phone Disposition Plan: Remain in ICU Status is: Inpatient  Remains inpatient appropriate because:Hemodynamically unstable and Inpatient level of care appropriate due to severity of illness  Dispo: The patient is from: Home              Anticipated d/c is to: TBD >>  will have hospital death              Anticipated d/c date is: > 3 days              Patient currently is not medically stable to d/c.  Consultants:   PCCM  Procedures:   None  Antimicrobials:  Remdesivir  Ceftriaxone  Azithromycin     Objective: Vitals:   08-Dec-2020 0800 12-08-20 0900 12-08-20 1000 Dec 08, 2020 1015  BP: 128/71 130/66 (!) 156/110 (!) 139/118  Pulse: (!) 143 64 (!) 119 (!) 137  Resp: (!) 34 (!) 33 (!) 27 (!) 27  Temp:      TempSrc:      SpO2: (!) 78% (!) 83% (!) 83% (!) 83%  Weight:      Height:        Intake/Output Summary (Last 24 hours) at 2020/12/08 1047 Last data filed at 2020/12/08 0600 Gross per 24 hour  Intake 2194.78 ml  Output 200 ml  Net 1994.78 ml   Filed Weights   11/02/2020 0908 11/01/2020 1706  Weight: 102.1 kg 96.2 kg     Encephalopathic, obtunded, moaning, does not follow commands, open eyes or answer any questions . Tachypneic, increased work of breathing, with diminished air entry at the bases . RRR,No Gallops,Rubs or new Murmurs, No Parasternal Heave +ve B.Sounds, Abd Soft, No tenderness, No rebound - guarding or rigidity. No Cyanosis, Clubbing or edema, No new Rash or bruise     Data Reviewed: I have personally reviewed following labs and imaging studies  CBC: Recent Labs  Lab  0936 11/08/20 0043 11/09/20 0253 11/10/20 0307  WBC 9.1 6.3 9.2 15.1*   NEUTROABS 8.2* 5.5 8.0* 13.5*  HGB 16.5* 15.4* 13.9 13.8  HCT 48.9* 47.0* 42.1 42.0  MCV 90.7 92.2 91.3 91.9  PLT 264 251 329 503   Basic Metabolic Panel: Recent Labs  Lab 11/21/2020 1357 11/08/20 0043 11/09/20 0253 11/10/20 0307 11/11/20 0247 11/12/20 0235  NA  --  136 138 143 144 146*  K  --  3.9 3.1* 3.8 3.6 3.8  CL  --  100 102 107 110 112*  CO2  --  21* 23 23 22  20*  GLUCOSE  --  173* 186* 202* 172* 191*  BUN  --  33* 43* 48* 40* 63*  CREATININE  --  1.35* 1.40* 1.32* 1.18* 2.48*  CALCIUM  --  7.9* 8.1* 8.2* 8.1* 8.1*  MG 2.2  --   --   --   --   --    GFR: Estimated Creatinine Clearance: 19.6 mL/min (A) (by C-G formula based on SCr of 2.48 mg/dL (H)). Liver Function Tests: Recent Labs  Lab 11/08/20 0043 11/09/20 0253 11/10/20 0307 11/11/20 0247 11/12/20 0235  AST 111* 80* 66* 69* 99*  ALT 78* 68* 63* 64* 65*  ALKPHOS 81 81 90 123 157*  BILITOT 0.6 0.6 0.7 0.6 1.8*  PROT 6.6 6.4* 6.2* 6.4* 6.4*  ALBUMIN 2.6* 2.6* 2.5* 2.6* 2.7*   Coagulation Profile: Recent Labs  Lab 11/15/2020 1357  INR 1.2   HbA1C: No results for input(s): HGBA1C in the last 72 hours. CBG: Recent Labs  Lab 11/12/20 0819 11/12/20 1242 11/12/20 1622 11/12/20 2116 11/17/20 0742  GLUCAP 170* 161* 167* 153* 175*   Anemia Panel: No results for input(s): VITAMINB12, FOLATE, FERRITIN, TIBC, IRON, RETICCTPCT in the last 72 hours.  Recent Results (from the past 240 hour(s))  Resp Panel by RT-PCR (Flu A&B, Covid) Nasopharyngeal Swab     Status: Abnormal   Collection Time: 11/06/2020  9:10 AM   Specimen: Nasopharyngeal Swab; Nasopharyngeal(NP) swabs in vial transport medium  Result Value Ref Range Status   SARS Coronavirus 2 by RT PCR POSITIVE (A) NEGATIVE Final    Comment: RESULT CALLED TO, READ BACK BY AND VERIFIED WITH: DR Erskine Speed LOTT @1139  ON [id number] BY COHENK (NOTE) SARS-CoV-2 target nucleic acids are DETECTED.  The SARS-CoV-2 RNA is generally detectable in upper  respiratory specimens during the acute phase of infection. Positive results are indicative of the presence of the identified virus, but do not rule out bacterial infection or co-infection with other pathogens not detected by the test. Clinical correlation with patient history and other diagnostic information is necessary to determine patient infection status. The expected result is Negative.  Fact Sheet for Patients: EntrepreneurPulse.com.au  Fact Sheet for Healthcare Providers: IncredibleEmployment.be  This test is not yet approved or cleared by the [place] FDA and  has been authorized for detection and/or diagnosis of SARS-CoV-2 by FDA under an Emergency Use Authorization (EUA).  This EUA will remain in effect (meaning this tes t can be used) for the duration of  the COVID-19 declaration under Section 564(b)(1) of the Act, 21 U.S.C. section 360bbb-3(b)(1), unless the authorization is terminated or revoked sooner.     Influenza A by PCR NEGATIVE NEGATIVE Final   Influenza B by PCR NEGATIVE NEGATIVE Final    Comment: (NOTE) The Xpert Xpress SARS-CoV-2/FLU/RSV plus assay is intended as an aid in the diagnosis of influenza from Nasopharyngeal swab specimens and should not be used as a sole basis for treatment. Nasal washings and aspirates are unacceptable for Xpert Xpress SARS-CoV-2/FLU/RSV testing.  Fact Sheet for Patients: EntrepreneurPulse.com.au  Fact Sheet for Healthcare Providers: IncredibleEmployment.be  This test is not yet approved or cleared by the [place] FDA and has been authorized for detection and/or diagnosis of SARS-CoV-2 by FDA under an Emergency Use Authorization (EUA). This EUA will remain in effect (meaning this test can be used) for the duration of the COVID-19 declaration under Section 564(b)(1) of the Act, 21 U.S.C. section 360bbb-3(b)(1), unless the authorization is  terminated or revoked.  Performed at [facility], [place] [address] [place], [place] [address]   Blood Culture (routine x 2)     Status: None   Collection Time: 11/25/2020  9:10 AM   Specimen: BLOOD  Result Value Ref Range Status   Specimen Description   Final    BLOOD RIGHT ANTECUBITAL Performed at [facility] [address], [place], [place] [address]    Special Requests   Final    BOTTLES DRAWN AEROBIC AND ANAEROBIC Blood Culture results may not be optimal due to an inadequate volume of blood received in culture bottles Performed at [facility]  [place] [address], [place], [place] [address]    Culture   Final    NO GROWTH 5 DAYS Performed at [facility], [place] [address], [place], [place] [address]    Report Status 11/12/2020 FINAL  Final  Blood Culture (routine x 2)     Status: None   Collection Time: 11/21/2020  9:41 AM   Specimen: BLOOD  Result Value Ref Range Status   Specimen Description   Final    BLOOD BLOOD RIGHT FOREARM Performed at [facility] [address] [place], [place] [address]    Special Requests   Final    BOTTLES DRAWN AEROBIC AND ANAEROBIC Blood Culture results may not be optimal due to an inadequate volume of blood received in culture bottles Performed at [facility] [address], [place], [place] [address]    Culture   Final    NO GROWTH 5 DAYS Performed at [facility], [place] [address] [place], [place] [address]    Report Status 11/12/2020 FINAL  Final  MRSA PCR Screening     Status: None   Collection Time: 11/28/2020  5:42 PM   Specimen: Nasal Mucosa; Nasopharyngeal  Result Value Ref Range Status   MRSA by PCR NEGATIVE NEGATIVE Final    Comment:        The GeneXpert MRSA Assay (FDA approved for NASAL specimens only), is one component of a comprehensive MRSA colonization surveillance program. It is not intended to diagnose  MRSA infection nor to guide or monitor treatment for MRSA infections. Performed at [facility], [place] [address], [place], [place] [address]       Radiology Studies: US Abdomen Complete  Result Date: 11/12/2020 CLINICAL DATA:  Elevated liver function tests and acute kidney injury. Current COVID-19 pneumonia. EXAM: ABDOMEN ULTRASOUND COMPLETE COMPARISON:  None. FINDINGS: Gallbladder: Small dependent gallstones are present in a nondistended gallbladder. No gallbladder wall thickening or pericholecystic fluid identified. Common bile duct: Diameter: 5 mm. Liver: Limited evaluation of the liver due to inability for the patient to hold her breath and shadowing by ribs and adjacent bowel gas. The liver parenchyma is mildly echogenic which may reflect a component of steatosis. No overt cirrhosis, mass lesions or intrahepatic biliary ductal dilatation. Portal vein is patent on color Doppler imaging with normal direction of blood flow towards the liver. IVC: No abnormality visualized. Pancreas: Nonvisualized. Spleen: Size and appearance within normal limits. Right Kidney: Length: 10.5 cm. Cortical atrophy and mildly increased cortical echogenicity is suggestive of chronic kidney disease. No hydronephrosis or renal lesions identified. Left Kidney: Length: 11.4 cm. Cortical atrophy and mildly increased cortical echogenicity is suggestive of chronic kidney disease. No hydronephrosis or focal renal lesions. Abdominal aorta: No aneurysm visualized. Other findings: No visualized free fluid. IMPRESSION: 1. Cholelithiasis. No findings by ultrasound to suggest obvious acute cholecystitis or biliary obstruction. 2. Limited evaluation of the liver which demonstrates mildly increased echogenicity. This may reflect a component of steatosis. 3. Both kidneys demonstrate cortical atrophy and increased cortical echogenicity suggestive of underlying chronic kidney disease. No evidence of hydronephrosis.  Electronically Signed   By: Aletta Edouard M.D.   On: 11/12/2020 10:53   DG CHEST PORT 1 VIEW  Result Date: 11/12/2020 CLINICAL DATA:  Respiratory failure and hypoxia secondary to bilateral COVID-19 pneumonia. EXAM: PORTABLE CHEST 1 VIEW COMPARISON:  11/24/2020 FINDINGS: Stable heart size. Severe interstitial and ground-glass alveolar infiltrates bilaterally are more confluent and dispersed throughout the  entire parenchyma of both lungs compared to the prior study. No evidence of pneumothorax or pleural effusions. IMPRESSION: Worsening of bilateral pulmonary airspace disease with more confluent infiltrates throughout both lungs. Electronically Signed   By: Aletta Edouard M.D.   On: 11/12/2020 08:52    Scheduled Meds: . apixaban  2.5 mg Oral BID  . baricitinib  1 mg Oral Daily  . chlorhexidine  15 mL Mouth Rinse BID  . Chlorhexidine Gluconate Cloth  6 each Topical Daily  . diltiazem  240 mg Oral Daily  . haloperidol lactate      . insulin aspart  0-15 Units Subcutaneous TID WC  . insulin aspart  0-5 Units Subcutaneous QHS  . Ipratropium-Albuterol  1 puff Inhalation Q6H  . linagliptin  5 mg Oral Daily  . mouth rinse  15 mL Mouth Rinse q12n4p  . predniSONE  50 mg Oral Daily  . sodium chloride flush  3 mL Intravenous Q12H   Continuous Infusions: . lactated ringers 100 mL/hr at November 30, 2020 0600     LOS: 6 days   Time spent: 35 minutes.  Phillips Climes, MD [facility] [email or web address] 2020-11-30, 10:47 AM

## 2020-11-30 NOTE — Progress Notes (Signed)
Waited for pt's son to join pt's daughter at bedside. Comfort care measures started. PT medicated for agitation. Patient passed at 22:15 with both children at bedside.

## 2020-11-30 NOTE — Progress Notes (Signed)
Pt less agitated.  Oxygen sats continue to fall, currently steady at 78%.  Daughter in earlier tonight and she is not ready to move pt to comfort care.  Patient's son is in [place].

## 2020-11-30 NOTE — Progress Notes (Signed)
[facility] on call notified on pt o2 saturations now in the 70s. [place] 60L 100% & NRB. This RN will continue to monitor & follow current treatment plan per MD

## 2020-11-30 NOTE — Progress Notes (Signed)
 NAME:  Jill Cooke, MRN:  [id number], DOB:  December 11, 1940, LOS: 6 ADMISSION DATE:  11/11/2020, CONSULTATION DATE:  12/14 REFERRING MD:  Bonner Puna, CHIEF COMPLAINT:  Dyspnea   Brief History   80 y/o female admitted on 12/9 with reports of increasing dyspnea since Thanksgiving and found to have severe acute hypoxic respiratory failure in the setting of COVID 19 pneumonia.  Admitted by the hospitalist service and has been treated with solumedrol 1mg /kg, baricitinib, and remdesivir.  PCCM was consulted on 12/14 due to worsening hypoxemic respiratory failure.     Past Medical History  Paroxysmal atrial fibrillation Endometrial hyperplasia/malignancy Anxiety Obesity due to excess calories  Significant Hospital Events   12/09 admission 12/14 PCCM consultation, goals of care conversation, discussion with family, DNR 12/15 70L flow / 100% fiO2   Consults:  PCCM  Procedures:    Significant Diagnostic Tests:  12/10 lower ext doppler > no DVT in lower ext bilaterally 12/11 echo > LVEF 60-65%, mild LVH, normal PASP, LA mild dilated, RA mild dilated, valves OK,  12/14 abdominal ultrasound >> cholelithiasis, steatosis of liver, renal cortical atrophy suggestive of underlying chronic kidney disease, no hydronephrosis  Micro Data:  12/9 sars cov 2 > pos, flu neg 12/9 blood >> negative   Antimicrobials:  12/9 baricitinib >>  12/9 solumedrol 1mg /kg >>  12/9 remdesivir > 12/13 12/9 ceftriaxone >> 12/12 12/9 azithromcyin >> 12/13   Interim history/subjective:  Afebrile I/O 200 ml, +1.9L RN reports pt appears uncomfortable, short of breath at rest, agitation treated with haldol Increased O2 needs > now on 70L flow 100% HHFNC + 100% NRB   Objective   Blood pressure 130/66, pulse 64, temperature 98.2 F (36.8 C), temperature source Axillary, resp. rate (!) 33, height 5\' 2"  (1.575 m), weight 96.2 kg, SpO2 (!) 83 %.    FiO2 (%):  [100 %] 100 %   Intake/Output Summary (Last 24 hours) at Dec 04, 2020  0958 Last data filed at 2020/12/04 0600 Gross per 24 hour  Intake 2194.78 ml  Output 200 ml  Net 1994.78 ml   Filed Weights   11/20/2020 0908 11/12/2020 1706  Weight: 102.1 kg 96.2 kg    Examination: General: elderly adult female lying in bed, appears ill  HEENT: MM pink/dry, mild icterus & scleral edema, pupils reactive  Neuro: Awakens to voice, moans, appears uncomfortable, MAE spontaneously  CV: s1s2 irr irr, no m/r/g PULM: labored with abdominal accessory muscle use, lungs bilaterally diminished GI: soft, bsx4 active  Extremities: warm/dry, trace to 1+ LE edema  Skin: no rashes or lesions     Resolved Hospital Problem list     Assessment & Plan:   ARDS due to COVID 19 pneumonia Severe respiratory failure with worsening overnight.  Considering her obesity, advanced age and physical deconditioning at baseline, the likelihood of survival here is very low.  She would not survive mechanical ventilation if it came to that.  -wean O2 for sats >85% at rest, 75% with exertion  -prone positioning if able  -continue baricitinib  -continue solumedrol 40 mg IV Q8 -incentive spirometry, pulmonary hygiene  AFwRVR -eliquis, cardizem per primary   AKI -Trend BMP / urinary output -Replace electrolytes as indicated -Avoid nephrotoxic agents, ensure adequate renal perfusion -reduce IVF to 50 ml/hr  Hyperglycemia  -SSI per primary   Code status:  DNR If declines further, consider full comfort care.  Son traveling in from [place].  Hopeful to allow the family a brief visit on arrival.   Best practice (evaluated  daily)   Per Primary  Labs   CBC: Recent Labs  Lab 11/23/2020 0936 11/08/20 0043 11/09/20 0253 11/10/20 0307  WBC 9.1 6.3 9.2 15.1*  NEUTROABS 8.2* 5.5 8.0* 13.5*  HGB 16.5* 15.4* 13.9 13.8  HCT 48.9* 47.0* 42.1 42.0  MCV 90.7 92.2 91.3 91.9  PLT 264 251 329 338    Basic Metabolic Panel: Recent Labs  Lab 11/02/2020 1357 11/08/20 0043 11/09/20 0253  11/10/20 0307 11/11/20 0247 11/12/20 0235  NA  --  136 138 143 144 146*  K  --  3.9 3.1* 3.8 3.6 3.8  CL  --  100 102 107 110 112*  CO2  --  21* 23 23 22  20*  GLUCOSE  --  173* 186* 202* 172* 191*  BUN  --  33* 43* 48* 40* 63*  CREATININE  --  1.35* 1.40* 1.32* 1.18* 2.48*  CALCIUM  --  7.9* 8.1* 8.2* 8.1* 8.1*  MG 2.2  --   --   --   --   --    GFR: Estimated Creatinine Clearance: 19.6 mL/min (A) (by C-G formula based on SCr of 2.48 mg/dL (H)). Recent Labs  Lab 11/08/2020 0910 11/29/2020 0936 11/12/2020 1247 11/08/20 0043 11/09/20 0253 11/10/20 0307  PROCALCITON  --  0.54  --  0.41  --   --   WBC  --  9.1  --  6.3 9.2 15.1*  LATICACIDVEN 1.9  --  1.7  --   --   --     Liver Function Tests: Recent Labs  Lab 11/08/20 0043 11/09/20 0253 11/10/20 0307 11/11/20 0247 11/12/20 0235  AST 111* 80* 66* 69* 99*  ALT 78* 68* 63* 64* 65*  ALKPHOS 81 81 90 123 157*  BILITOT 0.6 0.6 0.7 0.6 1.8*  PROT 6.6 6.4* 6.2* 6.4* 6.4*  ALBUMIN 2.6* 2.6* 2.5* 2.6* 2.7*   No results for input(s): LIPASE, AMYLASE in the last 168 hours. No results for input(s): AMMONIA in the last 168 hours.  ABG    Component Value Date/Time   PHART 7.462 (H) 11/11/2020 2008   PCO2ART 33.1 11/11/2020 2008   PO2ART 66.6 (L) 11/11/2020 2008   HCO3 23.5 11/11/2020 2008   O2SAT 93.7 11/11/2020 2008     Coagulation Profile: Recent Labs  Lab  1357  INR 1.2    Cardiac Enzymes: No results for input(s): CKTOTAL, CKMB, CKMBINDEX, TROPONINI in the last 168 hours.  HbA1C: Hgb A1c MFr Bld  Date/Time Value Ref Range Status  11/08/2020 12:43 AM 6.3 (H) 4.8 - 5.6 % Final    Comment:    (NOTE) Pre diabetes:          5.7%-6.4%  Diabetes:              >6.4%  Glycemic control for   <7.0% adults with diabetes     CBG: Recent Labs  Lab 11/12/20 0819 11/12/20 1242 11/12/20 1622 11/12/20 2116 11-27-20 0742  GLUCAP 170* 161* 167* 153* 175*     Critical care time: n/a    Noe Gens,  MSN, NP-C, AGACNP-BC [facility] 11/27/2020, 4:11 PM   Please see [email or web address] for pager details.

## 2020-11-30 NOTE — Death Summary Note (Signed)
DEATH SUMMARY   Patient Details  Name: Jill Cooke MRN: [id number] DOB: May 19, 1941  Admission/Discharge Information   Admit Date:  2020/11/23  Date of Death: Date of Death: 2020-11-29  Time of Death: Time of Death: 03/06/14  Length of Stay: 7  Referring Physician: Katherina Mires, MD   Reason(s) for Hospitalization   -Shortness of breath  Diagnoses  Preliminary cause of death:    -Acute hypoxemic respiratory failure due to COVID-19 pneumonia  Secondary Diagnoses (including complications and co-morbidities):  Principal Problem:   Acute hypoxemic respiratory failure due to COVID-19 [facility]) Active Problems:   Atrial fibrillation with RVR ([place])   Essential (primary) hypertension   Obesity (BMI 30-39.9)   Borderline diabetes mellitus   Encephalopathy due to COVID-19 virus   AKI (acute kidney injury) ([place])   CKD (chronic kidney disease), stage III (H[facility])   Hypokalemia   Hypernatremia   Transaminitis   Cholelithiasis   Brief Hospital Course (including significant findings, care, treatment, and services provided and events leading to death)  Jill Cooke is a 80 y.o. year old female past medical history of atrial fibrillation, on Eliquis, history of high blood pressure, patient admitted 11/24/2023 with shortness of breath, cough, she was noted to be with severe hypoxia, significant tachypnea, her chest x-ray was significant for multifocal opacity, and COVID-19 PCR test was positive on admission, inflammatory markers were significantly elevated with CRP of 19.3, D-dimers elevated at 4.7, patient diagnosed with severe COVID-19 of pneumonia, with severe parenchymal lung injury with hypoxia and significant oxygen requirement, she was admitted to stepdown, she was treated with IV remdesivir, antibiotics, baricitinib and steroids, during hospital stay she was noted to have elevated LFTs in the setting of COVID-19 inflammation, abdominal ultrasound was significant only for cholelithiasis and chronic kidney  disease , as well she had AKI with creatinine bumped to 2.48, from her baseline around 1.4, she continues to have severe hypoxia with extremely high oxygen requirement 70 L heated high flow nasal cannula at 100% and heated high flow nasal cannula at 100% and 15 L nonrebreather bag combined together, as well she did have severe encephalopathy during hospital stay, multifactorial, hypoxic/metabolic, in the setting of COVID-19 encephalopathy, and hospital delirium as well, overall patient was extremely frail, deconditioned, with severe encephalopathy and hypoxia and increased work of breathing, PCCM were consulted, considering her risk factors including obesity, advanced age, and physical deconditioning at baseline, her likelihood for survival is very low, and she would not survive mechanical ventilation if it came to that, she has been made DNR, there was no improvement with optimal medical management, after discussing with the family, decision has been made to proceed with comfort measures, she was kept on medical management with oxygen, medication still family from out of state were able to visit her, and then she was transitioned to comfort care, where she passed away with her son and daughter at bedside.    Pertinent Labs and Studies  Significant Diagnostic Studies US Abdomen Complete  Result Date: 11/12/2020 CLINICAL DATA:  Elevated liver function tests and acute kidney injury. Current COVID-19 pneumonia. EXAM: ABDOMEN ULTRASOUND COMPLETE COMPARISON:  None. FINDINGS: Gallbladder: Small dependent gallstones are present in a nondistended gallbladder. No gallbladder wall thickening or pericholecystic fluid identified. Common bile duct: Diameter: 5 mm. Liver: Limited evaluation of the liver due to inability for the patient to hold her breath and shadowing by ribs and adjacent bowel gas. The liver parenchyma is mildly echogenic which may reflect a component of  steatosis. No overt cirrhosis, mass lesions or  intrahepatic biliary ductal dilatation. Portal vein is patent on color Doppler imaging with normal direction of blood flow towards the liver. IVC: No abnormality visualized. Pancreas: Nonvisualized. Spleen: Size and appearance within normal limits. Right Kidney: Length: 10.5 cm. Cortical atrophy and mildly increased cortical echogenicity is suggestive of chronic kidney disease. No hydronephrosis or renal lesions identified. Left Kidney: Length: 11.4 cm. Cortical atrophy and mildly increased cortical echogenicity is suggestive of chronic kidney disease. No hydronephrosis or focal renal lesions. Abdominal aorta: No aneurysm visualized. Other findings: No visualized free fluid. IMPRESSION: 1. Cholelithiasis. No findings by ultrasound to suggest obvious acute cholecystitis or biliary obstruction. 2. Limited evaluation of the liver which demonstrates mildly increased echogenicity. This may reflect a component of steatosis. 3. Both kidneys demonstrate cortical atrophy and increased cortical echogenicity suggestive of underlying chronic kidney disease. No evidence of hydronephrosis. Electronically Signed   By: Aletta Edouard M.D.   On: 11/12/2020 10:53   DG CHEST PORT 1 VIEW  Result Date: 11/12/2020 CLINICAL DATA:  Respiratory failure and hypoxia secondary to bilateral COVID-19 pneumonia. EXAM: PORTABLE CHEST 1 VIEW COMPARISON:  11/15/2020 FINDINGS: Stable heart size. Severe interstitial and ground-glass alveolar infiltrates bilaterally are more confluent and dispersed throughout the entire parenchyma of both lungs compared to the prior study. No evidence of pneumothorax or pleural effusions. IMPRESSION: Worsening of bilateral pulmonary airspace disease with more confluent infiltrates throughout both lungs. Electronically Signed   By: Aletta Edouard M.D.   On: 11/12/2020 08:52   DG Chest Port 1 View  Result Date: 10/31/2020 CLINICAL DATA:  Dyspnea.  Weakness, shortness of breath and fevers. EXAM: PORTABLE CHEST  1 VIEW COMPARISON:  September 09, 2012. FINDINGS: Bilateral interstitial and airspace opacities. Low lung volumes. No visible pleural effusions or pneumothorax. Cardiac silhouette is largely obscured. No acute osseous abnormality. Bilateral shoulder degenerative change. IMPRESSION: Bilateral interstitial and airspace opacities, compatible with multifocal pneumonia. Electronically Signed   By: Margaretha Sheffield MD   On: 11/23/2020 09:59   ECHOCARDIOGRAM COMPLETE  Result Date: 11/09/2020    ECHOCARDIOGRAM REPORT   Patient Name:   Ariela S Lafond Date of Exam: 11/09/2020 Medical Rec #:  [id number]   Height:       62.0 in Accession #:    [id number]  Weight:       212.1 lb Date of Birth:  03-04-1941   BSA:          1.960 m Patient Age:    89 years    BP:           109/67 mmHg Patient Gender: F           HR:           95 bpm. Exam Location:  Inpatient Procedure: 2D Echo, Color Doppler and Cardiac Doppler Indications:    Acute Respiratory Insufficiency 518.82 / R06.89                 Dyspnea 786.09 / R06.00  History:        Patient has prior history of Echocardiogram examinations, most                 recent 10/20/2017. Arrythmias:Atrial Fibrillation.  Sonographer:    Bernadene Person RDCS Referring Phys: [place]  1. Left ventricular ejection fraction, by estimation, is 60 to 65%. The left ventricle has normal function. The left ventricle has no regional wall motion abnormalities. There is mild left ventricular hypertrophy.  Left ventricular diastolic parameters are indeterminate.  2. Right ventricular systolic function is normal. The right ventricular size is normal. There is normal pulmonary artery systolic pressure.  3. Left atrial size was mildly dilated.  4. Right atrial size was mildly dilated.  5. The mitral valve is normal in structure. Trivial mitral valve regurgitation. No evidence of mitral stenosis.  6. The aortic valve is tricuspid. Aortic valve regurgitation is not visualized. Mild aortic  valve sclerosis is present, with no evidence of aortic valve stenosis.  7. The inferior vena cava is normal in size with greater than 50% respiratory variability, suggesting right atrial pressure of 3 mmHg. FINDINGS  Left Ventricle: Left ventricular ejection fraction, by estimation, is 60 to 65%. The left ventricle has normal function. The left ventricle has no regional wall motion abnormalities. The left ventricular internal cavity size was normal in size. There is  mild left ventricular hypertrophy. Left ventricular diastolic parameters are indeterminate. Right Ventricle: The right ventricular size is normal. No increase in right ventricular wall thickness. Right ventricular systolic function is normal. There is normal pulmonary artery systolic pressure. The tricuspid regurgitant velocity is 2.41 m/s, and  with an assumed right atrial pressure of 3 mmHg, the estimated right ventricular systolic pressure is 17.4 mmHg. Left Atrium: Left atrial size was mildly dilated. Right Atrium: Right atrial size was mildly dilated. Pericardium: There is no evidence of pericardial effusion. Mitral Valve: The mitral valve is normal in structure. Trivial mitral valve regurgitation. No evidence of mitral valve stenosis. Tricuspid Valve: The tricuspid valve is normal in structure. Tricuspid valve regurgitation is not demonstrated. No evidence of tricuspid stenosis. Aortic Valve: The aortic valve is tricuspid. Aortic valve regurgitation is not visualized. Mild aortic valve sclerosis is present, with no evidence of aortic valve stenosis. Pulmonic Valve: The pulmonic valve was normal in structure. Pulmonic valve regurgitation is not visualized. No evidence of pulmonic stenosis. Aorta: The aortic root is normal in size and structure. Venous: The inferior vena cava is normal in size with greater than 50% respiratory variability, suggesting right atrial pressure of 3 mmHg. IAS/Shunts: No atrial level shunt detected by color flow Doppler.   LEFT VENTRICLE PLAX 2D LVIDd:         3.40 cm LVIDs:         2.30 cm LV PW:         1.10 cm LV IVS:        1.20 cm LVOT diam:     1.70 cm LV SV:         48 LV SV Index:   25 LVOT Area:     2.27 cm  RIGHT VENTRICLE TAPSE (M-mode): 1.5 cm LEFT ATRIUM             Index       RIGHT ATRIUM           Index LA diam:        4.20 cm 2.14 cm/m  RA Area:     21.80 cm LA Vol (A2C):   65.5 ml 33.42 ml/m RA Volume:   60.20 ml  30.72 ml/m LA Vol (A4C):   81.9 ml 41.79 ml/m LA Biplane Vol: 80.6 ml 41.12 ml/m  AORTIC VALVE LVOT Vmax:   109.33 cm/s LVOT Vmean:  80.700 cm/s LVOT VTI:    0.213 m  AORTA Ao Root diam: 2.60 cm Ao Asc diam:  2.60 cm TRICUSPID VALVE TR Peak grad:   23.2 mmHg TR Vmax:  241.00 cm/s  SHUNTS Systemic VTI:  0.21 m Systemic Diam: 1.70 cm Jenkins Rouge MD Electronically signed by Jenkins Rouge MD Signature Date/Time: 11/09/2020/3:30:28 PM    Final    VAS [place] LOWER EXTREMITY VENOUS (DVT)  Result Date: 11/10/2020  Lower Venous DVT Study Indications: D-dimer, Covid-19.  Comparison Study: No prior studies. Performing Technologist: Darlin Coco RDMS  Examination Guidelines: A complete evaluation includes B-mode imaging, spectral Doppler, color Doppler, and power Doppler as needed of all accessible portions of each vessel. Bilateral testing is considered an integral part of a complete examination. Limited examinations for reoccurring indications may be performed as noted. The reflux portion of the exam is performed with the patient in reverse Trendelenburg.  +---------+---------------+---------+-----------+----------+--------------+ RIGHT    CompressibilityPhasicitySpontaneityPropertiesThrombus Aging +---------+---------------+---------+-----------+----------+--------------+ CFV      Full           Yes      Yes                                 +---------+---------------+---------+-----------+----------+--------------+ SFJ      Full                                                         +---------+---------------+---------+-----------+----------+--------------+ FV Prox  Full                                                        +---------+---------------+---------+-----------+----------+--------------+ FV Mid   Full                                                        +---------+---------------+---------+-----------+----------+--------------+ FV DistalFull                                                        +---------+---------------+---------+-----------+----------+--------------+ PFV      Full                                                        +---------+---------------+---------+-----------+----------+--------------+ POP      Full           Yes      Yes                                 +---------+---------------+---------+-----------+----------+--------------+ PTV      Full                                                        +---------+---------------+---------+-----------+----------+--------------+  PERO     Full                                                        +---------+---------------+---------+-----------+----------+--------------+   +---------+---------------+---------+-----------+----------+--------------+ LEFT     CompressibilityPhasicitySpontaneityPropertiesThrombus Aging +---------+---------------+---------+-----------+----------+--------------+ CFV      Full           Yes      Yes                                 +---------+---------------+---------+-----------+----------+--------------+ SFJ      Full                                                        +---------+---------------+---------+-----------+----------+--------------+ FV Prox  Full                                                        +---------+---------------+---------+-----------+----------+--------------+ FV Mid   Full                                                         +---------+---------------+---------+-----------+----------+--------------+ FV DistalFull                                                        +---------+---------------+---------+-----------+----------+--------------+ PFV      Full                                                        +---------+---------------+---------+-----------+----------+--------------+ POP      Full           Yes      Yes                                 +---------+---------------+---------+-----------+----------+--------------+ PTV      Full                                                        +---------+---------------+---------+-----------+----------+--------------+ PERO     Full                                                        +---------+---------------+---------+-----------+----------+--------------+  Summary: RIGHT: - There is no evidence of deep vein thrombosis in the lower extremity.  - No cystic structure found in the popliteal fossa.  LEFT: - There is no evidence of deep vein thrombosis in the lower extremity.  - No cystic structure found in the popliteal fossa.  *See table(s) above for measurements and observations. Electronically signed by Jamelle Haring on 11/10/2020 at 1:12:06 PM.    Final     Microbiology Recent Results (from the past 240 hour(s))  Resp Panel by RT-PCR (Flu A&B, Covid) Nasopharyngeal Swab     Status: Abnormal   Collection Time: 11/20/2020  9:10 AM   Specimen: Nasopharyngeal Swab; Nasopharyngeal(NP) swabs in vial transport medium  Result Value Ref Range Status   SARS Coronavirus 2 by RT PCR POSITIVE (A) NEGATIVE Final    Comment: RESULT CALLED TO, READ BACK BY AND VERIFIED WITH: DR Erskine Speed LOTT @1139  ON [id number] BY COHENK (NOTE) SARS-CoV-2 target nucleic acids are DETECTED.  The SARS-CoV-2 RNA is generally detectable in upper respiratory specimens during the acute phase of infection. Positive results are indicative of the presence of the identified  virus, but do not rule out bacterial infection or co-infection with other pathogens not detected by the test. Clinical correlation with patient history and other diagnostic information is necessary to determine patient infection status. The expected result is Negative.  Fact Sheet for Patients: EntrepreneurPulse.com.au  Fact Sheet for Healthcare Providers: IncredibleEmployment.be  This test is not yet approved or cleared by the [place] FDA and  has been authorized for detection and/or diagnosis of SARS-CoV-2 by FDA under an Emergency Use Authorization (EUA).  This EUA will remain in effect (meaning this tes t can be used) for the duration of  the COVID-19 declaration under Section 564(b)(1) of the Act, 21 U.S.C. section 360bbb-3(b)(1), unless the authorization is terminated or revoked sooner.     Influenza A by PCR NEGATIVE NEGATIVE Final   Influenza B by PCR NEGATIVE NEGATIVE Final    Comment: (NOTE) The Xpert Xpress SARS-CoV-2/FLU/RSV plus assay is intended as an aid in the diagnosis of influenza from Nasopharyngeal swab specimens and should not be used as a sole basis for treatment. Nasal washings and aspirates are unacceptable for Xpert Xpress SARS-CoV-2/FLU/RSV testing.  Fact Sheet for Patients: EntrepreneurPulse.com.au  Fact Sheet for Healthcare Providers: IncredibleEmployment.be  This test is not yet approved or cleared by the [place] FDA and has been authorized for detection and/or diagnosis of SARS-CoV-2 by FDA under an Emergency Use Authorization (EUA). This EUA will remain in effect (meaning this test can be used) for the duration of the COVID-19 declaration under Section 564(b)(1) of the Act, 21 U.S.C. section 360bbb-3(b)(1), unless the authorization is terminated or revoked.  Performed at [facility], [place] [address] [place], [place] [address]   Blood  Culture (routine x 2)     Status: None   Collection Time: 11/03/2020  9:10 AM   Specimen: BLOOD  Result Value Ref Range Status   Specimen Description   Final    BLOOD RIGHT ANTECUBITAL Performed at [place] [address] [place], [place] [address]    Special Requests   Final    BOTTLES DRAWN AEROBIC AND ANAEROBIC Blood Culture results may not be optimal due to an inadequate volume of blood received in culture bottles Performed at [facility] [address], [place], [place] [address]    Culture   Final    NO GROWTH 5 DAYS Performed  at [facility], [place] [address], [place], [place] [address]    Report Status 11/12/2020 FINAL  Final  Blood Culture (routine x 2)     Status: None   Collection Time: 11/27/2020  9:41 AM   Specimen: BLOOD  Result Value Ref Range Status   Specimen Description   Final    BLOOD BLOOD RIGHT FOREARM Performed at [facility] [address], [place], [place] [address]    Special Requests   Final    BOTTLES DRAWN AEROBIC AND ANAEROBIC Blood Culture results may not be optimal due to an inadequate volume of blood received in culture bottles Performed at [facility] [address], [place], [place] [address]    Culture   Final    NO GROWTH 5 DAYS Performed at [facility], [place] [address] [place], [place] [address]    Report Status 11/12/2020 FINAL  Final  MRSA PCR Screening     Status: None   Collection Time: 11/01/2020  5:42 PM   Specimen: Nasal Mucosa; Nasopharyngeal  Result Value Ref Range Status   MRSA by PCR NEGATIVE NEGATIVE Final    Comment:        The GeneXpert MRSA Assay (FDA approved for NASAL specimens only), is one component of a comprehensive MRSA colonization surveillance program. It is not intended to diagnose MRSA infection nor to guide or monitor treatment for MRSA infections. Performed at [facility], [place] [address] [place], [place] [address]     Lab Basic Metabolic Panel: Recent Labs  Lab 11/08/20 0043 11/09/20 0253 11/10/20 0307 11/11/20 0247 11/12/20 0235  NA 136 138 143 144 146*  K 3.9 3.1* 3.8 3.6 3.8  CL 100 102 107 110 112*  CO2 21* 23 23 22  20*  GLUCOSE 173* 186* 202* 172* 191*  BUN 33* 43* 48* 40* 63*  CREATININE 1.35* 1.40* 1.32* 1.18* 2.48*  CALCIUM 7.9* 8.1* 8.2* 8.1* 8.1*   Liver Function Tests: Recent Labs  Lab 11/08/20 0043 11/09/20 0253 11/10/20 0307 11/11/20 0247 11/12/20 0235  AST 111* 80* 66* 69* 99*  ALT 78* 68* 63* 64* 65*  ALKPHOS 81 81 90 123 157*  BILITOT 0.6 0.6 0.7 0.6 1.8*  PROT 6.6 6.4* 6.2* 6.4* 6.4*  ALBUMIN 2.6* 2.6* 2.5* 2.6* 2.7*   No results for input(s): LIPASE, AMYLASE in the last 168 hours. No results for input(s): AMMONIA in the last 168 hours. CBC: Recent Labs  Lab 11/08/20 0043 11/09/20 0253 11/10/20 0307  WBC 6.3 9.2 15.1*  NEUTROABS 5.5 8.0* 13.5*  HGB 15.4* 13.9 13.8  HCT 47.0* 42.1 42.0  MCV 92.2 91.3 91.9  PLT 251 329 376   Cardiac Enzymes: No results for input(s): CKTOTAL, CKMB, CKMBINDEX, TROPONINI in the last 168 hours. Sepsis Labs: Recent Labs  Lab 11/08/20 0043 11/09/20 0253 11/10/20 0307  PROCALCITON 0.41  --   --   WBC 6.3 9.2 15.1*    Procedures/Operations     Karena Kinker 11/18/2020, 3:16 PM

## 2020-11-30 DEATH — deceased

## 2021-10-24 IMAGING — US US ABDOMEN COMPLETE
1 series · 13 of 25 positions shown · non-contrast
Comparison: None.

CLINICAL DATA: Elevated liver function tests and acute kidney
injury. Current [id number]-[id number] pneumonia.

EXAM:
ABDOMEN ULTRASOUND COMPLETE

[Series 1: us abdomen complete · 13 of 68 slices shown]
[im 1/68]
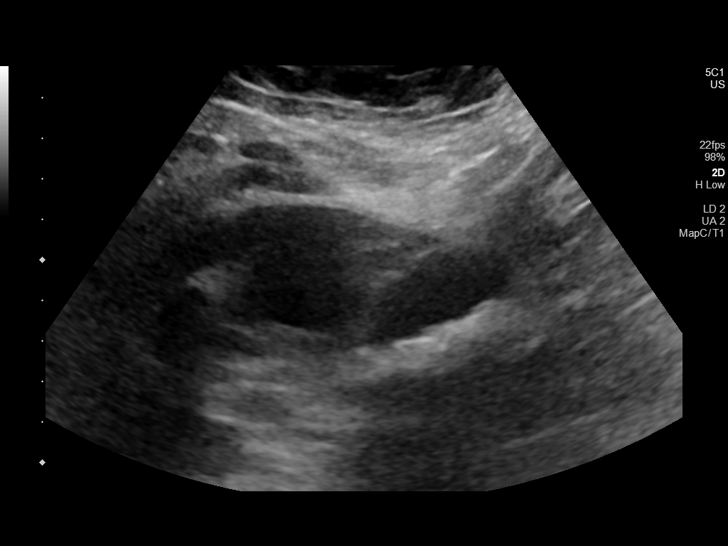
[im 6/68]
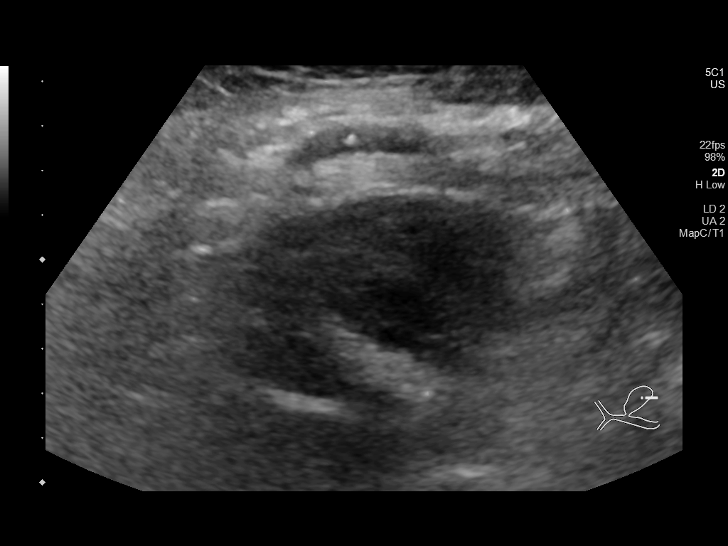
[im 12/68]
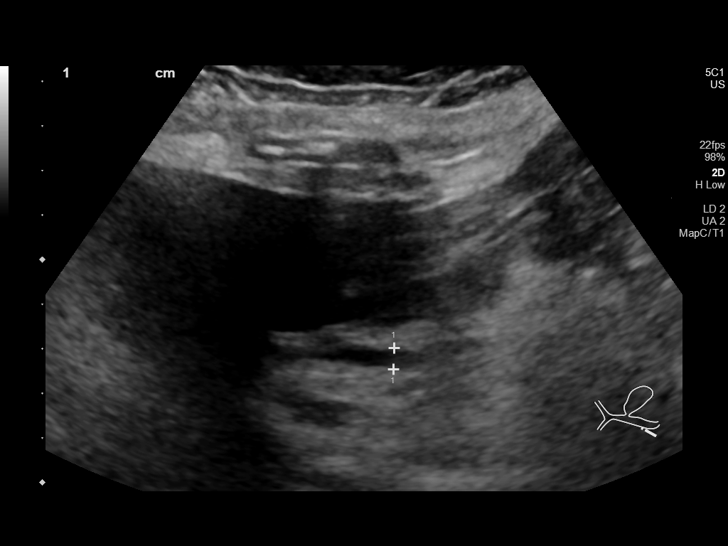
[im 17/68]
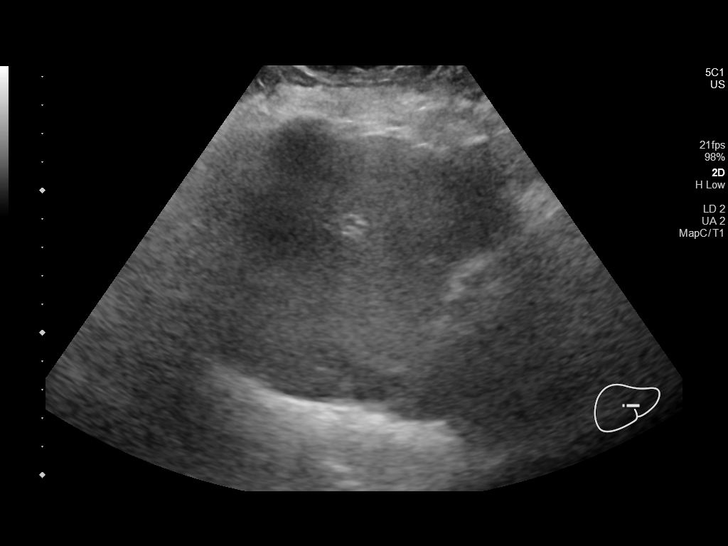
[im 23/68]
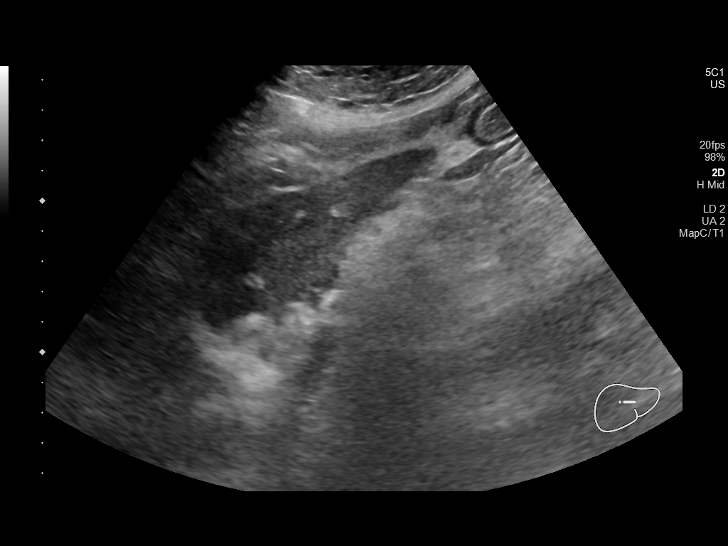
[im 28/68]
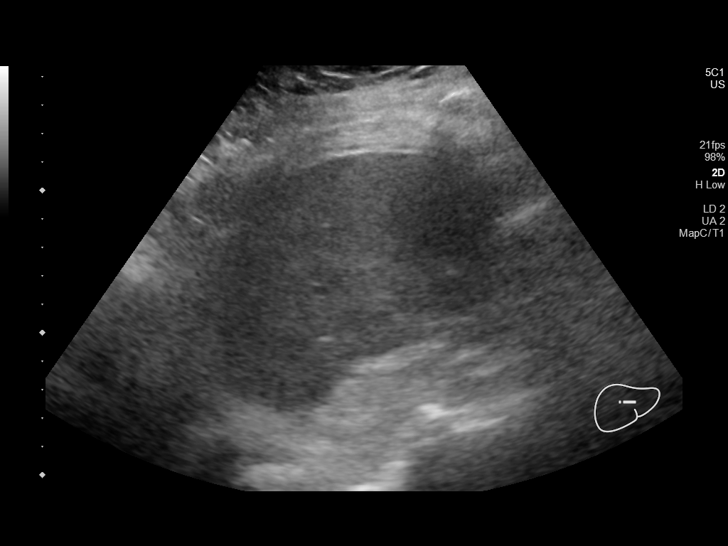
[im 34/68]
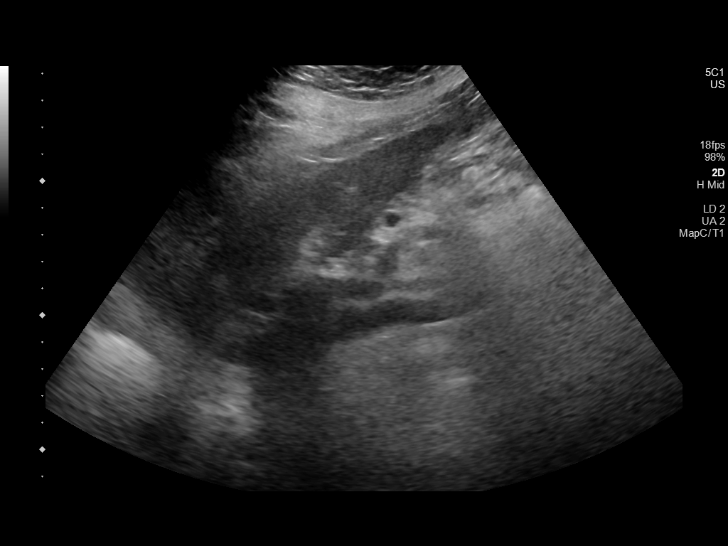
[im 40/68]
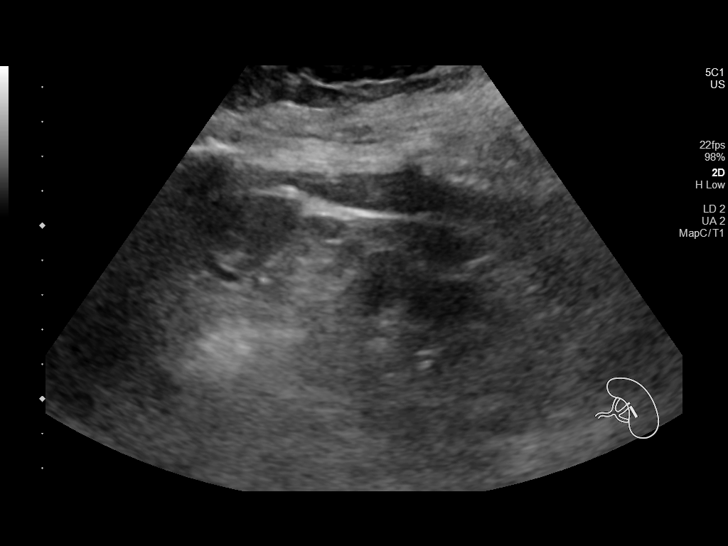
[im 45/68]
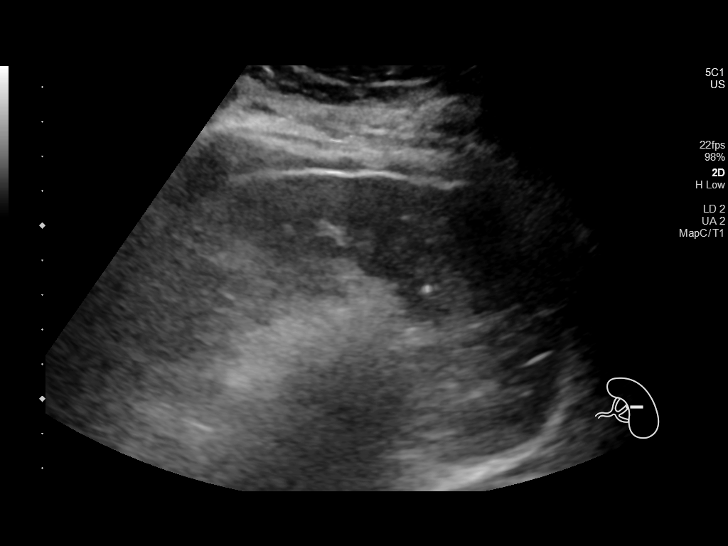
[im 51/68]
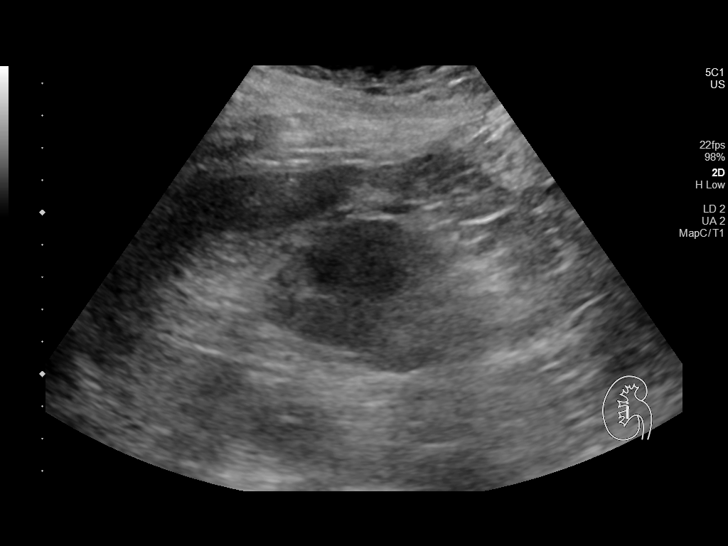
[im 56/68]
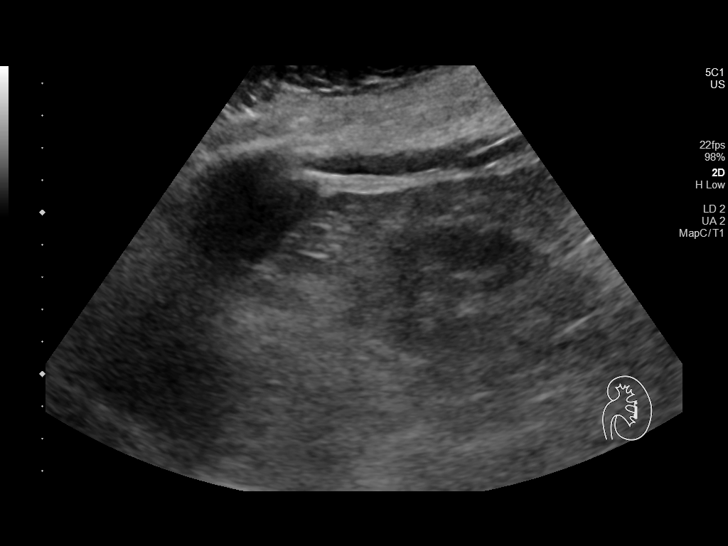
[im 62/68]
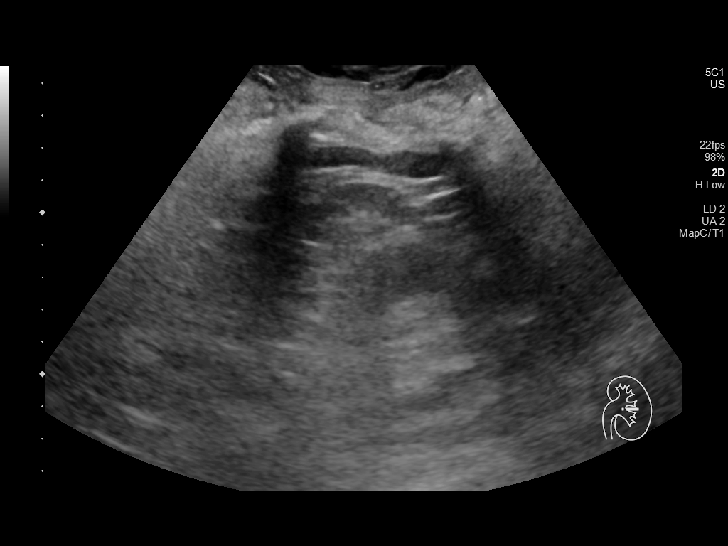
[im 68/68]
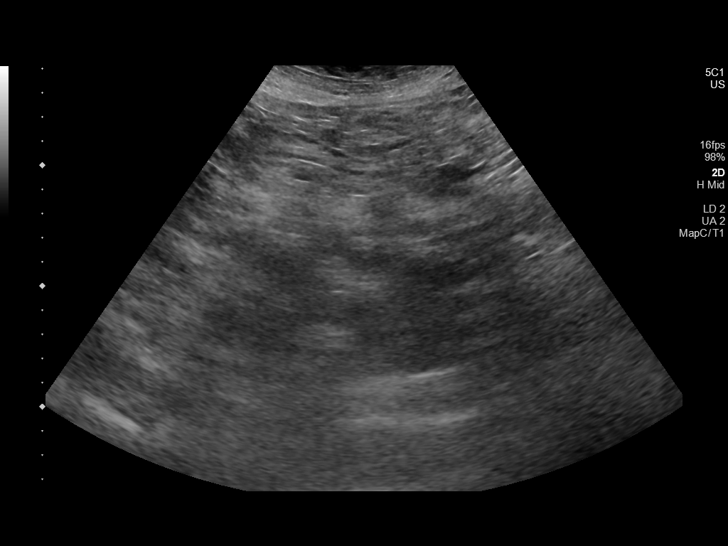

[13 of 25 positions shown; findings below may reference images not displayed]

FINDINGS: Gallbladder: Small dependent gallstones are present in a
nondistended gallbladder. No gallbladder wall thickening or
pericholecystic fluid identified.

Common bile duct: Diameter: 5 mm.

Liver: Limited evaluation of the liver due to inability for the
patient to hold her breath and shadowing by ribs and adjacent bowel
gas. The liver parenchyma is mildly echogenic which may reflect a
component of steatosis. No overt cirrhosis, mass lesions or
intrahepatic biliary ductal dilatation. Portal vein is patent on
color Doppler imaging with normal direction of blood flow towards
the liver.

IVC: No abnormality visualized.

Pancreas: Nonvisualized.

Spleen: Size and appearance within normal limits.

Right Kidney: Length: 10.5 cm. Cortical atrophy and mildly increased
cortical echogenicity is suggestive of chronic kidney disease. No
hydronephrosis or renal lesions identified.

Left Kidney: Length: 11.4 cm. Cortical atrophy and mildly increased
cortical echogenicity is suggestive of chronic kidney disease. No
hydronephrosis or focal renal lesions.

Abdominal aorta: No aneurysm visualized.

Other findings: No visualized free fluid.
IMPRESSION: 1. Cholelithiasis. No findings by ultrasound to suggest obvious
acute cholecystitis or biliary obstruction.
2. Limited evaluation of the liver which demonstrates mildly
increased echogenicity. This may reflect a component of steatosis.
3. Both kidneys demonstrate cortical atrophy and increased cortical
echogenicity suggestive of underlying chronic kidney disease. No
evidence of hydronephrosis.
